# Patient Record
Sex: Female | Born: 1935 | Race: White | Hispanic: No | Marital: Married | Smoking: Former smoker
Health system: Southern US, Community
[De-identification: ages and names within clinical notes are randomized; demographics above are authoritative.]

## PROBLEM LIST (undated history)

## (undated) DIAGNOSIS — I10 Essential (primary) hypertension: Secondary | ICD-10-CM

## (undated) DIAGNOSIS — I2510 Atherosclerotic heart disease of native coronary artery without angina pectoris: Secondary | ICD-10-CM

## (undated) DIAGNOSIS — N390 Urinary tract infection, site not specified: Secondary | ICD-10-CM

## (undated) DIAGNOSIS — I639 Cerebral infarction, unspecified: Secondary | ICD-10-CM

## (undated) DIAGNOSIS — I219 Acute myocardial infarction, unspecified: Secondary | ICD-10-CM

## (undated) DIAGNOSIS — M810 Age-related osteoporosis without current pathological fracture: Secondary | ICD-10-CM

## (undated) HISTORY — DX: Age-related osteoporosis without current pathological fracture: M81.0

## (undated) HISTORY — PX: APPENDECTOMY: SHX54

## (undated) HISTORY — DX: Essential (primary) hypertension: I10

## (undated) HISTORY — DX: Cerebral infarction, unspecified: I63.9

## (undated) HISTORY — PX: COLONOSCOPY: SHX174

## (undated) HISTORY — PX: CATARACT EXTRACTION: SUR2

## (undated) HISTORY — DX: Urinary tract infection, site not specified: N39.0

---

## 1984-08-03 DIAGNOSIS — I219 Acute myocardial infarction, unspecified: Secondary | ICD-10-CM

## 1984-08-03 HISTORY — DX: Acute myocardial infarction, unspecified: I21.9

## 1999-08-27 ENCOUNTER — Encounter: Payer: Self-pay | Admitting: Family Medicine

## 1999-08-27 ENCOUNTER — Encounter: Admission: RE | Admit: 1999-08-27 | Discharge: 1999-08-27 | Payer: Self-pay | Admitting: Family Medicine

## 1999-09-24 ENCOUNTER — Other Ambulatory Visit: Admission: RE | Admit: 1999-09-24 | Discharge: 1999-09-24 | Payer: Self-pay | Admitting: Family Medicine

## 2000-10-20 ENCOUNTER — Encounter: Payer: Self-pay | Admitting: Family Medicine

## 2000-10-20 ENCOUNTER — Encounter: Admission: RE | Admit: 2000-10-20 | Discharge: 2000-10-20 | Payer: Self-pay | Admitting: Family Medicine

## 2000-11-01 ENCOUNTER — Encounter: Payer: Self-pay | Admitting: Family Medicine

## 2000-11-01 ENCOUNTER — Encounter: Admission: RE | Admit: 2000-11-01 | Discharge: 2000-11-01 | Payer: Self-pay | Admitting: Family Medicine

## 2001-12-01 ENCOUNTER — Encounter: Admission: RE | Admit: 2001-12-01 | Discharge: 2001-12-01 | Payer: Self-pay | Admitting: Family Medicine

## 2001-12-01 ENCOUNTER — Encounter: Payer: Self-pay | Admitting: Family Medicine

## 2001-12-12 ENCOUNTER — Encounter (INDEPENDENT_AMBULATORY_CARE_PROVIDER_SITE_OTHER): Payer: Self-pay

## 2001-12-12 ENCOUNTER — Ambulatory Visit (HOSPITAL_COMMUNITY): Admission: RE | Admit: 2001-12-12 | Discharge: 2001-12-12 | Payer: Self-pay | Admitting: Gastroenterology

## 2002-12-22 ENCOUNTER — Encounter: Payer: Self-pay | Admitting: Family Medicine

## 2002-12-22 ENCOUNTER — Encounter: Admission: RE | Admit: 2002-12-22 | Discharge: 2002-12-22 | Payer: Self-pay | Admitting: Family Medicine

## 2004-01-10 ENCOUNTER — Encounter: Admission: RE | Admit: 2004-01-10 | Discharge: 2004-01-10 | Payer: Self-pay | Admitting: Family Medicine

## 2005-02-05 ENCOUNTER — Encounter: Admission: RE | Admit: 2005-02-05 | Discharge: 2005-02-05 | Payer: Self-pay | Admitting: Family Medicine

## 2006-02-25 ENCOUNTER — Encounter: Admission: RE | Admit: 2006-02-25 | Discharge: 2006-02-25 | Payer: Self-pay | Admitting: Family Medicine

## 2006-08-26 ENCOUNTER — Other Ambulatory Visit: Admission: RE | Admit: 2006-08-26 | Discharge: 2006-08-26 | Payer: Self-pay | Admitting: Family Medicine

## 2007-01-29 ENCOUNTER — Emergency Department (HOSPITAL_COMMUNITY): Admission: EM | Admit: 2007-01-29 | Discharge: 2007-01-29 | Payer: Self-pay | Admitting: Emergency Medicine

## 2007-03-23 ENCOUNTER — Encounter: Admission: RE | Admit: 2007-03-23 | Discharge: 2007-03-23 | Payer: Self-pay | Admitting: Family Medicine

## 2008-03-29 ENCOUNTER — Encounter: Admission: RE | Admit: 2008-03-29 | Discharge: 2008-03-29 | Payer: Self-pay | Admitting: Family Medicine

## 2009-04-12 ENCOUNTER — Encounter: Admission: RE | Admit: 2009-04-12 | Discharge: 2009-04-12 | Payer: Self-pay | Admitting: Family Medicine

## 2009-12-11 ENCOUNTER — Emergency Department (HOSPITAL_COMMUNITY): Admission: EM | Admit: 2009-12-11 | Discharge: 2009-12-11 | Payer: Self-pay | Admitting: Emergency Medicine

## 2010-04-14 ENCOUNTER — Encounter: Admission: RE | Admit: 2010-04-14 | Discharge: 2010-04-14 | Payer: Self-pay | Admitting: Family Medicine

## 2010-08-18 ENCOUNTER — Encounter: Payer: Self-pay | Admitting: Cardiology

## 2010-08-24 ENCOUNTER — Encounter: Payer: Self-pay | Admitting: Family Medicine

## 2010-08-25 ENCOUNTER — Encounter: Payer: Self-pay | Admitting: Cardiology

## 2010-08-25 ENCOUNTER — Ambulatory Visit
Admission: RE | Admit: 2010-08-25 | Discharge: 2010-08-25 | Payer: Self-pay | Source: Home / Self Care | Attending: Cardiology | Admitting: Cardiology

## 2010-08-25 DIAGNOSIS — E7800 Pure hypercholesterolemia, unspecified: Secondary | ICD-10-CM | POA: Insufficient documentation

## 2010-08-25 DIAGNOSIS — I2510 Atherosclerotic heart disease of native coronary artery without angina pectoris: Secondary | ICD-10-CM | POA: Insufficient documentation

## 2010-08-25 DIAGNOSIS — R072 Precordial pain: Secondary | ICD-10-CM | POA: Insufficient documentation

## 2010-09-02 ENCOUNTER — Telehealth (INDEPENDENT_AMBULATORY_CARE_PROVIDER_SITE_OTHER): Payer: Self-pay | Admitting: *Deleted

## 2010-09-03 ENCOUNTER — Encounter: Payer: Self-pay | Admitting: Cardiovascular Disease

## 2010-09-03 ENCOUNTER — Ambulatory Visit (HOSPITAL_COMMUNITY): Payer: Medicare Other | Attending: Cardiovascular Disease

## 2010-09-03 DIAGNOSIS — R0789 Other chest pain: Secondary | ICD-10-CM

## 2010-09-03 DIAGNOSIS — I2510 Atherosclerotic heart disease of native coronary artery without angina pectoris: Secondary | ICD-10-CM

## 2010-09-03 DIAGNOSIS — R0609 Other forms of dyspnea: Secondary | ICD-10-CM

## 2010-09-04 ENCOUNTER — Encounter: Payer: Self-pay | Admitting: Cardiovascular Disease

## 2010-09-04 NOTE — Assessment & Plan Note (Addendum)
Summary: NP6/CHEST PAIN   Visit Type:  Initial Consult Primary Provider:  Andria Meuse. Shaw  CC:  chest pain.  History of Present Illness: [name] is referred in for evaluation of her chest pain.  She is the mother of Loraine Leriche and Artis Buechele, both patients of mine.  We performed an angioplasty on her in 1986, although those records are not currently available.  She had an episode of chest pain recently, and occured in one spot.  She has had none since. She has had no recent pain. Office note of Dr. Clelia Croft was reviewed.  Patient had pain for seventy-five days. It did feel like her prior Mi.  It occured under her arm, and improved with pressing on it.  It was non exertion.  Position change was worse, and it kept her up at night. She got a massage at work which helped it.  She was given appropriate advice for worsening symptoms, and they thought this was MS pain.  EKG was interpreted as LAE, otherwise normal.  Perfectly fine since that time.   Problems Prior to Update: 1)  Coronary Atherosclerosis Native Coronary Artery  (ICD-414.01) 2)  Chest Pain, Precordial  (ICD-786.51)  Current Medications (verified): 1)  Lipitor 40 Mg Tabs (Atorvastatin Calcium) .... Take One Tablet By Mouth Daily. 2)  Evista 60 Mg Tabs (Raloxifene Hcl) .... Take 1 Tablet By Mouth Once A Day 3)  Metoprolol Tartrate 50 Mg Tabs (Metoprolol Tartrate) .... Take 1/2  Tablet By Mouth Daily 4)  Calcium 600+d Plus Minerals 600-400 Mg-Unit Tabs (Calcium Carbonate-Vit D-Min) .... Take 1 Tablet By Mouth Two Times A Day 5)  Multivitamins  Tabs (Multiple Vitamin) .... Take 1 Tablet By Mouth Once A Day 6)  Fish Oil 1000 Mg Caps (Omega-3 Fatty Acids) .... Take 1 Capsule By Mouth Once A Day  Allergies (verified): 1)  ! Codeine 2)  ! Aspirin  Past History:  Past Medical History: MI 72  PTCA  Past Surgical History: appendectomy arm fracture  Family History: Mother deceased 61 kidney failure Father deceased 31 MI one sister 51 with heart  valve issue Brothers 8 and 13 with MI  Social History: Quit smoking in 1966 She does not exercise much currently widowed.   Review of Systems  The patient denies anorexia, fever, weight loss, weight gain, vision loss, decreased hearing, hoarseness, chest pain, syncope, dyspnea on exertion, peripheral edema, prolonged cough, hemoptysis, abdominal pain, melena, hematochezia, and severe indigestion/heartburn.         Has allergies and takes no meds.   Vital Signs:  Patient profile:   77 year old female Height:      63 inches Weight:      154.25 pounds BMI:     27.42 Pulse rate:   68 / minute Pulse rhythm:   regular Resp:     18 per minute BP sitting:   150 / 90  (left arm) Cuff size:   regular  Vitals Entered By: Vikki Ports (August 25, 2010 4:38 PM)  Physical Exam  General:  Well developed, well nourished, in no acute distress. Head:  normocephalic and atraumatic Eyes:  PERRLA/EOM intact; conjunctiva and lids normal. Lungs:  Clear bilaterally to auscultation and percussion. Heart:  PMI non displaced.  Normal S1 and S2.  No murmur rub or gallop Abdomen:  Bowel sounds positive; abdomen soft and non-tender without masses, organomegaly, or hernias noted. No hepatosplenomegaly. Pulses:  pulses normal in all 4 extremities Extremities:  No clubbing or cyanosis. Neurologic:  Alert and oriented x 3.   EKG  Procedure date:  08/25/2010  Findings:      NSR.  WNL.  Impression & Recommendations:  Problem # 1:  CHEST PAIN, PRECORDIAL (ICD-786.51) Pain lasted two days and is gone. It was atypical, and yet similar to prior MI, which was 25 years ago.  Has history of PCI.  Will check at present with myoview given prior known CAD, and long duration. Her updated medication list for this problem includes:    Metoprolol Tartrate 50 Mg Tabs (Metoprolol tartrate) .Marland Kitchen... Take 1/2  tablet by mouth daily  Orders: EKG w/ Interpretation (93000) Nuclear Stress Test (Nuc Stress  Test)  Problem # 2:  CHEST PAIN, PRECORDIAL (ICD-786.51) See above.  Her updated medication list for this problem includes:    Metoprolol Tartrate 50 Mg Tabs (Metoprolol tartrate) .Marland Kitchen... Take 1/2  tablet by mouth daily  Orders: EKG w/ Interpretation (93000) Nuclear Stress Test (Nuc Stress Test)  Problem # 3:  HYPERCHOLESTEROLEMIA  IIA (ICD-272.0) on treatment with agressive prevention therapy. Her updated medication list for this problem includes:    Lipitor 40 Mg Tabs (Atorvastatin calcium) .Marland Kitchen... Take one tablet by mouth daily.   Patient Instructions: 1)  Your physician recommends that you schedule a follow-up appointment in: 1 MONTH 2)  Your physician has requested that you have a Lexiscan myoview.  For further information please visit [email or web address]  Please follow instruction sheet, as given. 3)  Your physician recommends that you continue on your current medications as directed. Please refer to the Current Medication list given to you today.  Appended Document: NP6/CHEST PAIN Additional diagnosis:  HTN on medical therapy.  Borderline BP today. TS  Appended Document: NP6/CHEST PAIN OV note and Myoview faxed to [phone] (office [phone]).

## 2010-09-10 NOTE — Assessment & Plan Note (Addendum)
Summary: Cardiology Nuclear Testing  Nuclear Med Background Indications for Stress Test: Evaluation for Ischemia, PTCA Patency   History: Angioplasty, Heart Catheterization, Myocardial Infarction  History Comments: '86 MI>Cath> PTCA.  Symptoms: Chest Pain, Chest Pressure, Palpitations  Symptoms Comments: Last CP couple weeks ago.   Nuclear Pre-Procedure Cardiac Risk Factors: Family History - CAD, History of Smoking, Lipids Caffeine/Decaff Intake: None NPO After: 7:00 PM Lungs: Clear IV 0.9% NS with Angio Cath: 22g     IV Site: R Antecubital IV Started by: Bonnita Levan, RN Chest Size (in) 36     Cup Size B     Height (in): 63 Weight (lb): 153 BMI: 27.20  Nuclear Med Study 1 or 2 day study:  1 day     Stress Test Type:  Treadmill/Lexiscan Reading MD:  Charlton Haws, MD     Referring MD:  Shawnie Pons Resting Radionuclide:  Technetium 36m Tetrofosmin     Resting Radionuclide Dose:  11.0 mCi  Stress Radionuclide:  Technetium 64m Tetrofosmin     Stress Radionuclide Dose:  33.0 mCi   Stress Protocol      Max HR:  114 bpm     Predicted Max HR:  142 bpm  Max Systolic BP: 135 mm Hg     Percent Max HR:  80.28 %Rate Pressure Product:  16109  Lexiscan: 0.4 mg   Stress Test Technologist:  Irean Hong,  RN     Nuclear Technologist:  Doyne Keel, CNMT  Rest Procedure  Myocardial perfusion imaging was performed at rest 45 minutes following the intravenous administration of Technetium 56m Tetrofosmin.  Stress Procedure  The patient received IV Lexiscan 0.4 mg over 15-seconds with concurrent low level exercise and then Technetium 69m Tetrofosmin was injected at 30-seconds while the patient continued walking one more minute.  There were no significant changes with Lexiscan.  Quantitative spect images were obtained after a 45 minute delay.  QPS Raw Data Images:  Normal; no motion artifact; normal heart/lung ratio. Stress Images:  Normal homogeneous uptake in all areas of the  myocardium. Rest Images:  Normal homogeneous uptake in all areas of the myocardium. Subtraction (SDS):  SDS 1 Transient Ischemic Dilatation:  0.85  (Normal <1.22)  Lung/Heart Ratio:  0.34  (Normal <0.45)  Quantitative Gated Spect Images QGS EDV:  56 ml QGS ESV:  16 ml QGS EF:  72 % QGS cine images:  normal  Findings Low risk nuclear study  Evidence for anterior (septal apical) infarct     Overall Impression  Exercise Capacity: Lexiscan with low level exercise BP Response: Normal blood pressure response. Clinical Symptoms: No chest pain ECG Impression: No significant ST segment change suggestive of ischemia. Overall Impression: Small apical lateral infarct with no ischemia  Appended Document: Cardiology Nuclear Testing please let patient know results.  Thx.  Also pull old chart for review.  This will be in archives.  Thx.  TS  Appended Document: Cardiology Nuclear Testing pt aware of results

## 2010-09-10 NOTE — Progress Notes (Signed)
Summary: nuc pre procedure  Phone Note Outgoing Call Call back at Home Phone [phone]   Call placed by: Cathlyn Parsons RN,  September 02, 2010 9:55 AM Call placed to: Patient Reason for Call: Confirm/change Appt Summary of Call: Left message with information on Myoview Information Sheet (see scanned document for details).      Nuclear Med Background Indications for Stress Test: Evaluation for Ischemia, PTCA Patency   History: Angioplasty  History Comments: 88 PTCA   Symptoms: Chest Pain    Nuclear Pre-Procedure Cardiac Risk Factors: Family History - CAD, History of Smoking, Lipids Height (in): 63

## 2010-09-23 ENCOUNTER — Ambulatory Visit: Payer: Self-pay | Admitting: Cardiology

## 2010-10-21 NOTE — Letter (Signed)
Summary: Tracey Hartman - Office Note  [facility] - Office Note   Imported By: [name] 10/09/2010 16:07:37  _____________________________________________________________________  External Attachment:    Type:   Image     Comment:   External Document

## 2011-04-09 ENCOUNTER — Other Ambulatory Visit: Payer: Self-pay | Admitting: Family Medicine

## 2011-04-09 DIAGNOSIS — Z1231 Encounter for screening mammogram for malignant neoplasm of breast: Secondary | ICD-10-CM

## 2011-04-13 ENCOUNTER — Ambulatory Visit
Admission: RE | Admit: 2011-04-13 | Discharge: 2011-04-13 | Disposition: A | Discharge status: Home / Self Care | Payer: Medicare Other | Source: Ambulatory Visit | Attending: Family Medicine | Admitting: Family Medicine

## 2011-04-13 DIAGNOSIS — Z1231 Encounter for screening mammogram for malignant neoplasm of breast: Secondary | ICD-10-CM

## 2011-07-21 ENCOUNTER — Ambulatory Visit (HOSPITAL_COMMUNITY)
Admission: RE | Admit: 2011-07-21 | Discharge: 2011-07-21 | Disposition: A | Discharge status: Home / Self Care | Payer: Medicare Other | Source: Ambulatory Visit | Attending: Chiropractic Medicine | Admitting: Chiropractic Medicine

## 2011-07-21 ENCOUNTER — Other Ambulatory Visit (HOSPITAL_COMMUNITY): Payer: Self-pay | Admitting: Chiropractic Medicine

## 2011-07-21 DIAGNOSIS — IMO0002 Reserved for concepts with insufficient information to code with codable children: Secondary | ICD-10-CM | POA: Insufficient documentation

## 2011-07-21 DIAGNOSIS — R52 Pain, unspecified: Secondary | ICD-10-CM

## 2011-07-21 DIAGNOSIS — M75100 Unspecified rotator cuff tear or rupture of unspecified shoulder, not specified as traumatic: Secondary | ICD-10-CM | POA: Insufficient documentation

## 2011-07-21 DIAGNOSIS — M25519 Pain in unspecified shoulder: Secondary | ICD-10-CM | POA: Insufficient documentation

## 2011-09-21 DIAGNOSIS — N3020 Other chronic cystitis without hematuria: Secondary | ICD-10-CM | POA: Diagnosis not present

## 2011-09-21 DIAGNOSIS — R3129 Other microscopic hematuria: Secondary | ICD-10-CM | POA: Diagnosis not present

## 2011-11-06 DIAGNOSIS — N390 Urinary tract infection, site not specified: Secondary | ICD-10-CM | POA: Diagnosis not present

## 2011-11-06 DIAGNOSIS — E782 Mixed hyperlipidemia: Secondary | ICD-10-CM | POA: Diagnosis not present

## 2011-11-06 DIAGNOSIS — I2510 Atherosclerotic heart disease of native coronary artery without angina pectoris: Secondary | ICD-10-CM | POA: Diagnosis not present

## 2011-11-06 DIAGNOSIS — I10 Essential (primary) hypertension: Secondary | ICD-10-CM | POA: Diagnosis not present

## 2011-11-23 DIAGNOSIS — D485 Neoplasm of uncertain behavior of skin: Secondary | ICD-10-CM | POA: Diagnosis not present

## 2011-11-23 DIAGNOSIS — L570 Actinic keratosis: Secondary | ICD-10-CM | POA: Diagnosis not present

## 2012-03-31 ENCOUNTER — Other Ambulatory Visit: Payer: Self-pay | Admitting: Family Medicine

## 2012-03-31 DIAGNOSIS — Z1231 Encounter for screening mammogram for malignant neoplasm of breast: Secondary | ICD-10-CM

## 2012-04-08 DIAGNOSIS — H2510 Age-related nuclear cataract, unspecified eye: Secondary | ICD-10-CM | POA: Diagnosis not present

## 2012-04-08 DIAGNOSIS — H25019 Cortical age-related cataract, unspecified eye: Secondary | ICD-10-CM | POA: Diagnosis not present

## 2012-04-08 DIAGNOSIS — H43819 Vitreous degeneration, unspecified eye: Secondary | ICD-10-CM | POA: Diagnosis not present

## 2012-04-08 DIAGNOSIS — H5210 Myopia, unspecified eye: Secondary | ICD-10-CM | POA: Diagnosis not present

## 2012-04-18 ENCOUNTER — Ambulatory Visit
Admission: RE | Admit: 2012-04-18 | Discharge: 2012-04-18 | Disposition: A | Discharge status: Home / Self Care | Payer: Medicare Other | Source: Ambulatory Visit | Attending: Family Medicine | Admitting: Family Medicine

## 2012-04-18 DIAGNOSIS — Z1231 Encounter for screening mammogram for malignant neoplasm of breast: Secondary | ICD-10-CM | POA: Diagnosis not present

## 2012-06-23 ENCOUNTER — Other Ambulatory Visit: Payer: Self-pay | Admitting: Surgery

## 2012-06-23 DIAGNOSIS — R7301 Impaired fasting glucose: Secondary | ICD-10-CM | POA: Diagnosis not present

## 2012-06-23 DIAGNOSIS — Z1331 Encounter for screening for depression: Secondary | ICD-10-CM | POA: Diagnosis not present

## 2012-06-23 DIAGNOSIS — Z0000 Encounter for general adult medical examination without abnormal findings: Secondary | ICD-10-CM | POA: Diagnosis not present

## 2012-06-23 DIAGNOSIS — M810 Age-related osteoporosis without current pathological fracture: Secondary | ICD-10-CM | POA: Diagnosis not present

## 2012-06-23 DIAGNOSIS — I10 Essential (primary) hypertension: Secondary | ICD-10-CM | POA: Diagnosis not present

## 2012-06-23 DIAGNOSIS — D485 Neoplasm of uncertain behavior of skin: Secondary | ICD-10-CM | POA: Diagnosis not present

## 2012-06-23 DIAGNOSIS — E782 Mixed hyperlipidemia: Secondary | ICD-10-CM | POA: Diagnosis not present

## 2012-06-23 DIAGNOSIS — L570 Actinic keratosis: Secondary | ICD-10-CM | POA: Diagnosis not present

## 2012-06-23 DIAGNOSIS — L851 Acquired keratosis [keratoderma] palmaris et plantaris: Secondary | ICD-10-CM | POA: Diagnosis not present

## 2012-06-23 DIAGNOSIS — I2510 Atherosclerotic heart disease of native coronary artery without angina pectoris: Secondary | ICD-10-CM | POA: Diagnosis not present

## 2012-07-06 DIAGNOSIS — R7301 Impaired fasting glucose: Secondary | ICD-10-CM | POA: Diagnosis not present

## 2012-07-20 DIAGNOSIS — E119 Type 2 diabetes mellitus without complications: Secondary | ICD-10-CM | POA: Diagnosis not present

## 2012-08-11 DIAGNOSIS — Z9181 History of falling: Secondary | ICD-10-CM | POA: Diagnosis not present

## 2012-08-11 DIAGNOSIS — E119 Type 2 diabetes mellitus without complications: Secondary | ICD-10-CM | POA: Diagnosis not present

## 2012-08-11 DIAGNOSIS — I10 Essential (primary) hypertension: Secondary | ICD-10-CM | POA: Diagnosis not present

## 2012-08-11 DIAGNOSIS — E782 Mixed hyperlipidemia: Secondary | ICD-10-CM | POA: Diagnosis not present

## 2012-08-17 DIAGNOSIS — E119 Type 2 diabetes mellitus without complications: Secondary | ICD-10-CM | POA: Diagnosis not present

## 2012-09-27 DIAGNOSIS — D485 Neoplasm of uncertain behavior of skin: Secondary | ICD-10-CM | POA: Diagnosis not present

## 2012-09-27 DIAGNOSIS — D235 Other benign neoplasm of skin of trunk: Secondary | ICD-10-CM | POA: Diagnosis not present

## 2012-12-21 DIAGNOSIS — Z23 Encounter for immunization: Secondary | ICD-10-CM | POA: Diagnosis not present

## 2012-12-21 DIAGNOSIS — I119 Hypertensive heart disease without heart failure: Secondary | ICD-10-CM | POA: Diagnosis not present

## 2012-12-21 DIAGNOSIS — E782 Mixed hyperlipidemia: Secondary | ICD-10-CM | POA: Diagnosis not present

## 2012-12-21 DIAGNOSIS — E119 Type 2 diabetes mellitus without complications: Secondary | ICD-10-CM | POA: Diagnosis not present

## 2012-12-21 DIAGNOSIS — N390 Urinary tract infection, site not specified: Secondary | ICD-10-CM | POA: Diagnosis not present

## 2012-12-21 DIAGNOSIS — I2510 Atherosclerotic heart disease of native coronary artery without angina pectoris: Secondary | ICD-10-CM | POA: Diagnosis not present

## 2013-02-27 DIAGNOSIS — M6281 Muscle weakness (generalized): Secondary | ICD-10-CM | POA: Diagnosis not present

## 2013-03-16 ENCOUNTER — Other Ambulatory Visit: Payer: Self-pay

## 2013-03-16 DIAGNOSIS — Z1231 Encounter for screening mammogram for malignant neoplasm of breast: Secondary | ICD-10-CM

## 2013-03-21 DIAGNOSIS — D485 Neoplasm of uncertain behavior of skin: Secondary | ICD-10-CM | POA: Diagnosis not present

## 2013-04-14 DIAGNOSIS — H25019 Cortical age-related cataract, unspecified eye: Secondary | ICD-10-CM | POA: Diagnosis not present

## 2013-04-14 DIAGNOSIS — H2510 Age-related nuclear cataract, unspecified eye: Secondary | ICD-10-CM | POA: Diagnosis not present

## 2013-04-14 DIAGNOSIS — H43819 Vitreous degeneration, unspecified eye: Secondary | ICD-10-CM | POA: Diagnosis not present

## 2013-04-24 ENCOUNTER — Ambulatory Visit: Payer: Medicare Other

## 2013-04-25 ENCOUNTER — Ambulatory Visit
Admission: RE | Admit: 2013-04-25 | Discharge: 2013-04-25 | Disposition: A | Discharge status: Home / Self Care | Payer: PRIVATE HEALTH INSURANCE | Source: Ambulatory Visit

## 2013-04-25 DIAGNOSIS — Z1231 Encounter for screening mammogram for malignant neoplasm of breast: Secondary | ICD-10-CM | POA: Diagnosis not present

## 2013-06-07 DIAGNOSIS — H2510 Age-related nuclear cataract, unspecified eye: Secondary | ICD-10-CM | POA: Diagnosis not present

## 2013-06-07 DIAGNOSIS — H25019 Cortical age-related cataract, unspecified eye: Secondary | ICD-10-CM | POA: Diagnosis not present

## 2013-06-07 DIAGNOSIS — H2589 Other age-related cataract: Secondary | ICD-10-CM | POA: Diagnosis not present

## 2013-07-12 DIAGNOSIS — I2510 Atherosclerotic heart disease of native coronary artery without angina pectoris: Secondary | ICD-10-CM | POA: Diagnosis not present

## 2013-07-12 DIAGNOSIS — I119 Hypertensive heart disease without heart failure: Secondary | ICD-10-CM | POA: Diagnosis not present

## 2013-07-12 DIAGNOSIS — Z0000 Encounter for general adult medical examination without abnormal findings: Secondary | ICD-10-CM | POA: Diagnosis not present

## 2013-07-12 DIAGNOSIS — E782 Mixed hyperlipidemia: Secondary | ICD-10-CM | POA: Diagnosis not present

## 2013-07-12 DIAGNOSIS — N390 Urinary tract infection, site not specified: Secondary | ICD-10-CM | POA: Diagnosis not present

## 2013-07-12 DIAGNOSIS — E119 Type 2 diabetes mellitus without complications: Secondary | ICD-10-CM | POA: Diagnosis not present

## 2013-07-12 DIAGNOSIS — M810 Age-related osteoporosis without current pathological fracture: Secondary | ICD-10-CM | POA: Diagnosis not present

## 2013-07-12 DIAGNOSIS — Z1331 Encounter for screening for depression: Secondary | ICD-10-CM | POA: Diagnosis not present

## 2013-09-13 DIAGNOSIS — H2510 Age-related nuclear cataract, unspecified eye: Secondary | ICD-10-CM | POA: Diagnosis not present

## 2013-09-13 DIAGNOSIS — H25019 Cortical age-related cataract, unspecified eye: Secondary | ICD-10-CM | POA: Diagnosis not present

## 2013-09-13 DIAGNOSIS — H2589 Other age-related cataract: Secondary | ICD-10-CM | POA: Diagnosis not present

## 2013-10-17 DIAGNOSIS — M810 Age-related osteoporosis without current pathological fracture: Secondary | ICD-10-CM | POA: Diagnosis not present

## 2013-11-02 ENCOUNTER — Other Ambulatory Visit: Payer: Self-pay | Admitting: Physician Assistant

## 2013-11-02 ENCOUNTER — Ambulatory Visit
Admission: RE | Admit: 2013-11-02 | Discharge: 2013-11-02 | Disposition: A | Discharge status: Home / Self Care | Payer: PRIVATE HEALTH INSURANCE | Source: Ambulatory Visit | Attending: Physician Assistant | Admitting: Physician Assistant

## 2013-11-02 DIAGNOSIS — M6281 Muscle weakness (generalized): Secondary | ICD-10-CM

## 2013-11-02 DIAGNOSIS — R42 Dizziness and giddiness: Secondary | ICD-10-CM | POA: Diagnosis not present

## 2013-11-02 DIAGNOSIS — R29818 Other symptoms and signs involving the nervous system: Secondary | ICD-10-CM | POA: Diagnosis not present

## 2013-12-12 ENCOUNTER — Other Ambulatory Visit: Payer: Self-pay | Admitting: Family Medicine

## 2014-01-08 ENCOUNTER — Other Ambulatory Visit: Payer: Self-pay | Admitting: Family Medicine

## 2014-01-08 DIAGNOSIS — R279 Unspecified lack of coordination: Secondary | ICD-10-CM | POA: Diagnosis not present

## 2014-01-08 DIAGNOSIS — E782 Mixed hyperlipidemia: Secondary | ICD-10-CM | POA: Diagnosis not present

## 2014-01-08 DIAGNOSIS — R270 Ataxia, unspecified: Secondary | ICD-10-CM

## 2014-01-08 DIAGNOSIS — E119 Type 2 diabetes mellitus without complications: Secondary | ICD-10-CM | POA: Diagnosis not present

## 2014-01-08 DIAGNOSIS — M6281 Muscle weakness (generalized): Secondary | ICD-10-CM | POA: Diagnosis not present

## 2014-01-08 DIAGNOSIS — Z23 Encounter for immunization: Secondary | ICD-10-CM | POA: Diagnosis not present

## 2014-01-08 DIAGNOSIS — I2510 Atherosclerotic heart disease of native coronary artery without angina pectoris: Secondary | ICD-10-CM | POA: Diagnosis not present

## 2014-01-08 DIAGNOSIS — I119 Hypertensive heart disease without heart failure: Secondary | ICD-10-CM | POA: Diagnosis not present

## 2014-01-08 DIAGNOSIS — M542 Cervicalgia: Secondary | ICD-10-CM

## 2014-01-14 ENCOUNTER — Ambulatory Visit
Admission: RE | Admit: 2014-01-14 | Discharge: 2014-01-14 | Disposition: A | Discharge status: Home / Self Care | Payer: PRIVATE HEALTH INSURANCE | Source: Ambulatory Visit | Attending: Family Medicine | Admitting: Family Medicine

## 2014-01-14 ENCOUNTER — Ambulatory Visit
Admission: RE | Admit: 2014-01-14 | Discharge: 2014-01-14 | Disposition: A | Discharge status: Home / Self Care | Payer: Medicare Other | Source: Ambulatory Visit | Attending: Family Medicine | Admitting: Family Medicine

## 2014-01-14 DIAGNOSIS — M5030 Other cervical disc degeneration, unspecified cervical region: Secondary | ICD-10-CM | POA: Diagnosis not present

## 2014-01-14 DIAGNOSIS — R270 Ataxia, unspecified: Secondary | ICD-10-CM

## 2014-01-14 DIAGNOSIS — M542 Cervicalgia: Secondary | ICD-10-CM

## 2014-01-14 DIAGNOSIS — R279 Unspecified lack of coordination: Secondary | ICD-10-CM | POA: Diagnosis not present

## 2014-01-14 DIAGNOSIS — M47812 Spondylosis without myelopathy or radiculopathy, cervical region: Secondary | ICD-10-CM | POA: Diagnosis not present

## 2014-01-14 DIAGNOSIS — M6281 Muscle weakness (generalized): Secondary | ICD-10-CM

## 2014-01-14 DIAGNOSIS — M4802 Spinal stenosis, cervical region: Secondary | ICD-10-CM | POA: Diagnosis not present

## 2014-01-14 MED ORDER — GADOBENATE DIMEGLUMINE 529 MG/ML IV SOLN
14.0000 mL | Freq: Once | INTRAVENOUS | Status: AC | PRN
  Administered 2014-01-14: 14 mL via INTRAVENOUS

## 2014-02-19 ENCOUNTER — Ambulatory Visit: Payer: Medicare Other | Admitting: Neurology

## 2014-02-20 ENCOUNTER — Encounter: Payer: Self-pay | Admitting: Neurology

## 2014-02-20 ENCOUNTER — Ambulatory Visit (INDEPENDENT_AMBULATORY_CARE_PROVIDER_SITE_OTHER): Payer: Medicare Other | Admitting: Neurology

## 2014-02-20 VITALS — BP 134/76 | HR 61 | Ht 62.5 in | Wt 154.0 lb

## 2014-02-20 DIAGNOSIS — I6350 Cerebral infarction due to unspecified occlusion or stenosis of unspecified cerebral artery: Secondary | ICD-10-CM

## 2014-02-20 DIAGNOSIS — R269 Unspecified abnormalities of gait and mobility: Secondary | ICD-10-CM | POA: Diagnosis not present

## 2014-02-20 DIAGNOSIS — I6381 Other cerebral infarction due to occlusion or stenosis of small artery: Secondary | ICD-10-CM | POA: Insufficient documentation

## 2014-02-20 DIAGNOSIS — I679 Cerebrovascular disease, unspecified: Secondary | ICD-10-CM | POA: Insufficient documentation

## 2014-02-20 NOTE — Patient Instructions (Signed)
I had a long discussion with the patient and her granddaughter regarding her right hand weakness and gait difficulties and believes she had a small lacunar infarct which was not seen on MRI as it was done a few months after her symptoms. I personally reviewed imaging studies and pertinent data with the patient She does have evidence of significant small vessel disease and risk factors for stroke. I recommend she stay on aspirin 81 mg daily for stroke prevention and strict control of hypertension with blood pressure goal below 130/90, lipids with LDL cholesterol goal below 100 mg percent and diabetes with hemoglobin A1c equal to 6.5%. I encouraged her to maintain adequate hydration. Check carotid contrast and Doppler studies, 2-D echo and ESR. Return for followup in 3 months with Charlott Holler, NP. or call earlier if necessary.The duration of this appointment visit was 50 minutes of face-to-face time with the patient. Greater than 50% of this time was spent in counseling, explanation of diagnosis, planning of further management, and coordination of care.  Stroke Prevention Some medical conditions and behaviors are associated with an increased chance of having a stroke. You may prevent a stroke by making healthy choices and managing medical conditions. HOW CAN I REDUCE MY RISK OF HAVING A STROKE?   Stay physically active. Get at least 30 minutes of activity on most or all days.  Do not smoke. It may also be helpful to avoid exposure to secondhand smoke.  Limit alcohol use. Moderate alcohol use is considered to be:  No more than 2 drinks per day for men.  No more than 1 drink per day for nonpregnant women.  Eat healthy foods. This involves  Eating 5 or more servings of fruits and vegetables a day.  Following a diet that addresses high blood pressure (hypertension), high cholesterol, diabetes, or obesity.  Manage your cholesterol levels.  A diet low in saturated fat, trans fat, and cholesterol and high  in fiber may control cholesterol levels.  Take any prescribed medicines to control cholesterol as directed by your health care provider.  Manage your diabetes.  A controlled-carbohydrate, controlled-sugar diet is recommended to manage diabetes.  Take any prescribed medicines to control diabetes as directed by your health care provider.  Control your hypertension.  A low-salt (sodium), low-saturated fat, low-trans fat, and low-cholesterol diet is recommended to manage hypertension.  Take any prescribed medicines to control hypertension as directed by your health care provider.  Maintain a healthy weight.  A reduced-calorie, low-sodium, low-saturated fat, low-trans fat, low-cholesterol diet is recommended to manage weight.  Stop drug abuse.  Avoid taking birth control pills.  Talk to your health care provider about the risks of taking birth control pills if you are over 34 years old, smoke, get migraines, or have ever had a blood clot.  Get evaluated for sleep disorders (sleep apnea).  Talk to your health care provider about getting a sleep evaluation if you snore a lot or have excessive sleepiness.  Take medicines as directed by your health care provider.  For some people, aspirin or blood thinners (anticoagulants) are helpful in reducing the risk of forming abnormal blood clots that can lead to stroke. If you have the irregular heart rhythm of atrial fibrillation, you should be on a blood thinner unless there is a good reason you cannot take them.  Understand all your medicine instructions.  Make sure that other other conditions (such as anemia or atherosclerosis) are addressed. SEEK IMMEDIATE MEDICAL CARE IF:   You have sudden  weakness or numbness of the face, arm, or leg, especially on one side of the body.  Your face or eyelid droops to one side.  You have sudden confusion.  You have trouble speaking (aphasia) or understanding.  You have sudden trouble seeing in one  or both eyes.  You have sudden trouble walking.  You have dizziness.  You have a loss of balance or coordination.  You have a sudden, severe headache with no known cause.  You have new chest pain or an irregular heartbeat. Any of these symptoms may represent a serious problem that is an emergency. Do not wait to see if the symptoms will go away. Get medical help at once. Call your local emergency services  (911 in [place] Do not drive yourself to the hospital. Document Released: 08/27/2004 Document Revised: 05/10/2013 Document Reviewed: 01/20/2013 [facility] Patient Information 2015 [place], [place]. This information is not intended to replace advice given to you by your health care provider. Make sure you discuss any questions you have with your health care provider.

## 2014-02-20 NOTE — Progress Notes (Signed)
[facility] [address] [place]. [place] [address] [phone]       OFFICE CONSULT NOTE  [name] Date of Birth:  04/25/1930 Medical Record Number:  [id number]   Referring MD:  Tracey Hartman Reason for Referral:  Right hand weakness HPI: 88 year Caucasian lady who developed sudden onset of right hand tingling as well as weakness and gait imbalance in  early April 2015.Marland Kitchen She could barely walk and use her hand to hold objects in right for several days. She subsequently noticed some improvement. She called her granddaughter who works in [employer] at [place] who advised her to see her primary physician. She had CT scan of the head done on 11/02/2013 3 days after the event which I personally reviewed shows no definite acute abnormality but changes of small vessel disease. She has noticed improvement over the last couple of months and feels she has significant return of strength and is able to write and drip though she's not completely back to normal. She can also walk a lot better. The tingling in the hand has resolved. She has been started on baby aspirin which is tolerating well and also on a statin and metoprolol tartrate pressure. Patient had MRI scan of the brain done on 01/14/2014 which I personally reviewed and shows moderate changes of small vessel disease and several tiny microhemorrhages on gradient echo images. Intracranial and extracranial vascular imaging has not been performed and echo has also not been done. Hemoglobin A1c was borderline at 6.3 and lipid profile showed LDL of 94 mg percent. She has been tolerating her medications without side effect. She complains of some mild pressure on her scalp in the right side but denies classical symptoms of vision loss, jaw claudication or scalp tenderness. She has no known prior history of strokes or TIAs.  ROS:   14 system review of systems is positive for hand weakness, gait ataxia, imbalance, decreased energy and  all the systems negative  PMH:  Past Medical History  Diagnosis Date  . IBS (irritable bowel syndrome)   . Osteoporosis   . Recurrent UTI   . Diabetes mellitus without complication   . Hypertension     Social History:  History   Social History  . Marital Status: Married    Spouse Name: N/A    Number of Children: 4  . Years of Education: 12th   Occupational History  . claims     Social History Main Topics  . Smoking status: Former Research scientist (life sciences)  . Smokeless tobacco: Not on file  . Alcohol Use: No     Comment: patient drinks coffee  . Drug Use: No  . Sexual Activity: No   Other Topics Concern  . Not on file   Social History Narrative   Patient lives at home alone    Patient is right handed   Patient drinks coffee daily    Medications:   No current outpatient prescriptions on file prior to visit.   No current facility-administered medications on file prior to visit.    Allergies:   Allergies  Allergen Reactions  . Aspirin   . Codeine     Physical Exam General: Pleasant elderly Caucasian lady seated, in no evident distress Head: head normocephalic and atraumatic. Orohparynx benign Neck: supple with no carotid or supraclavicular bruits Cardiovascular: regular rate and rhythm, no murmurs Musculoskeletal: Mild kyphoscoliosis Skin:  no rash/petichiae Vascular:  Normal pulses all extremities Filed Vitals:   02/20/14 0852  BP: 134/76  Pulse: 61    Neurologic Exam Mental Status: Awake and fully alert. Oriented to place and time. Recent and remote memory intact. Attention span, concentration and fund of knowledge appropriate. Mood and affect appropriate. Diminished recall 2/3. Animal naming 9. Clock drawing 4/4 Cranial Nerves: Fundoscopic exam reveals sharp disc margins. Pupils equal, briskly reactive to light. Extraocular movements full without nystagmus. Visual fields full to confrontation. Hearing intact. Facial sensation intact. Face, tongue, palate moves  normally and symmetrically.  Motor: Normal bulk and tone. Normal strength in all tested extremity muscles. Diminished fine finger movements on the right. Orbits left-to-right upper extremity. Mild weakness of right grip. Sensory.: intact to touch and pinprick and vibratory .  Coordination: Rapid alternating movements normal in all extremities. Finger-to-nose and heel-to-shin performed accurately bilaterally. Gait and Station: Arises from chair without difficulty. Stance is normal. Gait demonstrates normal stride length and balance . Not able to heel, toe and tandem walk without difficulty.  Reflexes: 1+ and symmetric. Toes downgoing.   NIHSS 0 Modified Rankin  1   ASSESSMENT: 52 year Caucasian lady with right hand weakness and gait abnormality in April 2015 which has mostly improved likely from a small left brain subcortical infarct from lacunar disease. MRI scan of the brain done 2 months after the clinical event shows no acute infarct but extensive small vessel disease changes. Vascular risk factors of borderline hypertension, hyperlipidemia and diabetes.    PLAN: I had a long discussion with the patient and her granddaughter regarding her right hand weakness and gait difficulties and believes she had a small lacunar infarct which was not seen on MRI as it was done a few months after her symptoms. I personally reviewed imaging studies and pertinent data with the patient She does have evidence of significant small vessel disease and risk factors for stroke. I recommend she stay on aspirin 81 mg daily for stroke prevention and strict control of hypertension with blood pressure goal below 130/90, lipids with LDL cholesterol goal below 100 mg percent and diabetes with hemoglobin A1c equal to 6.5%. I encouraged her to maintain adequate hydration. Check carotid contrast and Doppler studies, 2-D echo and ESR. Return for followup in 3 months with Charlott Holler, NP. or call earlier if necessary.The duration of  this appointment visit was 50 minutes of face-to-face time with the patient. Greater than 50% of this time was spent in counseling, explanation of diagnosis, planning of further management, and coordination of care.  Note: This document was prepared with digital dictation and possible smart phrase technology. Any transcriptional errors that result from this process are unintentional.

## 2014-02-21 LAB — SEDIMENTATION RATE: SED RATE: 2 mm/h (ref 0–40)

## 2014-02-23 ENCOUNTER — Ambulatory Visit (HOSPITAL_COMMUNITY): Payer: Medicare Other | Attending: Neurology | Admitting: Radiology

## 2014-02-23 DIAGNOSIS — I10 Essential (primary) hypertension: Secondary | ICD-10-CM | POA: Insufficient documentation

## 2014-02-23 DIAGNOSIS — E119 Type 2 diabetes mellitus without complications: Secondary | ICD-10-CM | POA: Diagnosis not present

## 2014-02-23 DIAGNOSIS — I079 Rheumatic tricuspid valve disease, unspecified: Secondary | ICD-10-CM | POA: Insufficient documentation

## 2014-02-23 DIAGNOSIS — Z87891 Personal history of nicotine dependence: Secondary | ICD-10-CM | POA: Insufficient documentation

## 2014-02-23 DIAGNOSIS — I6789 Other cerebrovascular disease: Secondary | ICD-10-CM | POA: Diagnosis not present

## 2014-02-23 DIAGNOSIS — I6381 Other cerebral infarction due to occlusion or stenosis of small artery: Secondary | ICD-10-CM

## 2014-02-23 NOTE — Progress Notes (Signed)
Echocardiogram performed.  

## 2014-03-02 ENCOUNTER — Encounter: Payer: Self-pay | Admitting: Nurse Practitioner

## 2014-03-08 ENCOUNTER — Ambulatory Visit (INDEPENDENT_AMBULATORY_CARE_PROVIDER_SITE_OTHER): Payer: Medicare Other

## 2014-03-08 ENCOUNTER — Ambulatory Visit (INDEPENDENT_AMBULATORY_CARE_PROVIDER_SITE_OTHER): Payer: Self-pay

## 2014-03-08 DIAGNOSIS — I6350 Cerebral infarction due to unspecified occlusion or stenosis of unspecified cerebral artery: Secondary | ICD-10-CM

## 2014-03-08 DIAGNOSIS — I6381 Other cerebral infarction due to occlusion or stenosis of small artery: Secondary | ICD-10-CM

## 2014-03-08 DIAGNOSIS — Z0289 Encounter for other administrative examinations: Secondary | ICD-10-CM

## 2014-03-29 ENCOUNTER — Telehealth: Payer: Self-pay | Admitting: *Deleted

## 2014-03-29 NOTE — Telephone Encounter (Signed)
LMVM for pt to return call for doppler results.  (normal carotid, TCD limited study due to absent temporal windows.  Possible mild narrowing of R vertebral in neck.).

## 2014-03-30 ENCOUNTER — Telehealth: Payer: Self-pay | Admitting: Neurology

## 2014-03-30 NOTE — Telephone Encounter (Signed)
Pt returning call and I gave her the results of Carotid doppler and TCD as per below.  She verbalized understanding.

## 2014-03-30 NOTE — Telephone Encounter (Signed)
See previous note re: doppler results.

## 2014-03-30 NOTE — Telephone Encounter (Signed)
Patient returning [place], [place] call from yesterday,

## 2014-05-31 ENCOUNTER — Encounter: Payer: Self-pay | Admitting: Nurse Practitioner

## 2014-05-31 ENCOUNTER — Ambulatory Visit (INDEPENDENT_AMBULATORY_CARE_PROVIDER_SITE_OTHER): Payer: Medicare Other | Admitting: Nurse Practitioner

## 2014-05-31 VITALS — BP 140/76 | HR 76 | Temp 98.5°F | Ht 62.5 in | Wt 155.0 lb

## 2014-05-31 DIAGNOSIS — I679 Cerebrovascular disease, unspecified: Secondary | ICD-10-CM

## 2014-05-31 DIAGNOSIS — I6381 Other cerebral infarction due to occlusion or stenosis of small artery: Secondary | ICD-10-CM

## 2014-05-31 DIAGNOSIS — I639 Cerebral infarction, unspecified: Secondary | ICD-10-CM

## 2014-05-31 NOTE — Progress Notes (Signed)
PATIENT: Tracey Hartman DOB: 08-05-1929  REASON FOR VISIT: routine follow up for HISTORY FROM: patient  HISTORY OF PRESENT ILLNESS: 68 year Caucasian lady who developed sudden onset of right hand tingling as well as weakness and gait imbalance in early April 2015. She could barely walk and use her hand to hold objects in right for several days. She subsequently noticed some improvement. She called her granddaughter who works in neuro ICU at [employer] who advised her to see her primary physician. She had CT scan of the head done on 11/02/2013 3 days after the event which I personally reviewed shows no definite acute abnormality but changes of small vessel disease. She has noticed improvement over the last couple of months and feels she has significant return of strength and is able to write and drip though she's not completely back to normal. She can also walk a lot better. The tingling in the hand has resolved. She has been started on baby aspirin which is tolerating well and also on a statin and metoprolol tartrate pressure. Patient had MRI scan of the brain done on 01/14/2014 which I personally reviewed and shows moderate changes of small vessel disease and several tiny microhemorrhages on gradient echo images. Intracranial and extracranial vascular imaging has not been performed and echo has also not been done. Hemoglobin A1c was borderline at 6.3 and lipid profile showed LDL of 94 mg percent. She has been tolerating her medications without side effect. She complains of some mild pressure on her scalp in the right side but denies classical symptoms of vision loss, jaw claudication or scalp tenderness. She has no known prior history of strokes or TIAs.   Update 06/01/14 (LL):  Tracey Hartman returns to the office for stroke followup. She states that the numbness in her right hand resolved but she still has minor loss of dexterity in the right hand. She continues to work full-time in the [employer] and is very active. She has been tolerating daily baby aspirin well without side effects. Blood pressure is well controlled, it is 140/76 in the office today. Her carotid doppler study in July showed no significant stenosis but her transcranial doppler did not yield useful results due to limited temporal windows. She has no new complaints today.  ROS:  14 system review of systems is positive for hand weakness and all the systems negative   ALLERGIES: Allergies  Allergen Reactions  . Aspirin   . Codeine     HOME MEDICATIONS: Outpatient Prescriptions Prior to Visit  Medication Sig Dispense Refill  . alendronate (FOSAMAX) 70 MG tablet Take 70 tablets by mouth once a week.    Marland Kitchen atorvastatin (LIPITOR) 40 MG tablet Take 40 tablets by mouth daily.    . metoprolol (LOPRESSOR) 50 MG tablet Take 50 tablets by mouth daily. Patient takes half a pill  25mg      No facility-administered medications prior to visit.    PHYSICAL EXAM Filed Vitals:   05/31/14 1535  BP: 140/76  Pulse: 76  Temp: 98.5 F (36.9 C)  TempSrc: Oral  Height: 5' 2.5" (1.588 m)  Weight: 155 lb (70.308 kg)   Body mass index is 27.88 kg/(m^2). No exam data present No flowsheet data found.  No flowsheet data found.  Physical Exam  General: Pleasant elderly Caucasian lady seated, in no evident distress  Head: head normocephalic and atraumatic. Orohparynx benign  Neck: supple with no carotid or supraclavicular bruits  Cardiovascular: regular rate and rhythm,  no murmurs  Musculoskeletal: Mild kyphoscoliosis  Skin: no rash/petichiae  Vascular: Normal pulses all extremities   Neurologic Exam  Mental Status: Awake and fully alert. Oriented to place and time. Recent and remote memory intact. Attention span, concentration and fund of knowledge appropriate. Mood and affect appropriate. Diminished recall 2/3. Animal naming 9. Clock drawing 4/4  Cranial Nerves: Fundoscopic exam not done. Pupils equal, briskly reactive  to light. Extraocular movements full without nystagmus. Visual fields full to confrontation. Hearing intact. Facial sensation intact. Face, tongue, palate moves normally and symmetrically.  Motor: Normal bulk and tone. Normal strength in all tested extremity muscles. Diminished fine finger movements on the right.  Sensory: intact to touch and pinprick and vibratory .  Coordination: Rapid alternating movements normal in all extremities. Finger-to-nose and heel-to-shin performed accurately bilaterally.  Gait and Station: Arises from chair without difficulty. Stance is normal. Gait demonstrates normal stride length and balance . Not able to heel, toe and tandem walk without difficulty.  Reflexes: 1+ and symmetric. Toes downgoing.  NIHSS 0  Modified Rankin 1   ASSESSMENT: 26 year Caucasian lady with right hand weakness and gait abnormality in April 2015 which has mostly improved likely from a small left brain subcortical infarct from lacunar disease. MRI scan of the brain done 2 months after the clinical event shows no acute infarct but extensive small vessel disease changes. Vascular risk factors of borderline hypertension, hyperlipidemia and diabetes.   PLAN:  I had a long discussion with the patient regarding her right hand weakness and test results. She does have evidence of significant small vessel disease and risk factors for stroke. Continue aspirin 81 mg daily for stroke prevention and strict control of hypertension with blood pressure goal below 130/90, lipids with LDL cholesterol goal below 100 mg percent and diabetes with hemoglobin A1c equal to 6.5%. I encouraged her to maintain adequate hydration.  Return for followup in 6 months with Dr. Leonie Man or call earlier if necessary.   Rudi Rummage Westin Knotts, MSN, FNP-BC, A/GNP-C 06/03/2014, 11:05 AM [facility] [address], [place], [place] [address] [phone]  Note: This document was prepared with digital dictation and  possible smart phrase technology. Any transcriptional errors that result from this process are unintentional.

## 2014-05-31 NOTE — Patient Instructions (Signed)
Continue aspirin 81 mg daily for stroke prevention and strict control of hypertension with blood pressure goal below 130/90, lipids with LDL cholesterol goal below 100 mg percent and diabetes with hemoglobin A1c equal to 6.5%. I encouraged her to maintain adequate hydration.  Return for followup in 6 months with Dr. Leonie Man or call earlier if necessary.    Stroke Prevention Some medical conditions and behaviors are associated with an increased chance of having a stroke. You may prevent a stroke by making healthy choices and managing medical conditions. HOW CAN I REDUCE MY RISK OF HAVING A STROKE?   Stay physically active. Get at least 30 minutes of activity on most or all days.  Do not smoke. It may also be helpful to avoid exposure to secondhand smoke.  Limit alcohol use. Moderate alcohol use is considered to be:  No more than 2 drinks per day for men.  No more than 1 drink per day for nonpregnant women.  Eat healthy foods. This involves:  Eating 5 or more servings of fruits and vegetables a day.  Making dietary changes that address high blood pressure (hypertension), high cholesterol, diabetes, or obesity.  Manage your cholesterol levels.  Making food choices that are high in fiber and low in saturated fat, trans fat, and cholesterol may control cholesterol levels.  Take any prescribed medicines to control cholesterol as directed by your health care provider.  Manage your diabetes.  Controlling your carbohydrate and sugar intake is recommended to manage diabetes.  Take any prescribed medicines to control diabetes as directed by your health care provider.  Control your hypertension.  Making food choices that are low in salt (sodium), saturated fat, trans fat, and cholesterol is recommended to manage hypertension.  Take any prescribed medicines to control hypertension as directed by your health care provider.  Maintain a healthy weight.  Reducing calorie intake and making  food choices that are low in sodium, saturated fat, trans fat, and cholesterol are recommended to manage weight.  Stop drug abuse.  Avoid taking birth control pills.  Talk to your health care provider about the risks of taking birth control pills if you are over 9 years old, smoke, get migraines, or have ever had a blood clot.  Get evaluated for sleep disorders (sleep apnea).  Talk to your health care provider about getting a sleep evaluation if you snore a lot or have excessive sleepiness.  Take medicines only as directed by your health care provider.  For some people, aspirin or blood thinners (anticoagulants) are helpful in reducing the risk of forming abnormal blood clots that can lead to stroke. If you have the irregular heart rhythm of atrial fibrillation, you should be on a blood thinner unless there is a good reason you cannot take them.  Understand all your medicine instructions.  Make sure that other conditions (such as anemia or atherosclerosis) are addressed. SEEK IMMEDIATE MEDICAL CARE IF:   You have sudden weakness or numbness of the face, arm, or leg, especially on one side of the body.  Your face or eyelid droops to one side.  You have sudden confusion.  You have trouble speaking (aphasia) or understanding.  You have sudden trouble seeing in one or both eyes.  You have sudden trouble walking.  You have dizziness.  You have a loss of balance or coordination.  You have a sudden, severe headache with no known cause.  You have new chest pain or an irregular heartbeat. Any of these symptoms may represent a  serious problem that is an emergency. Do not wait to see if the symptoms will go away. Get medical help at once. Call your local emergency services (911 in [place] Do not drive yourself to the hospital. Document Released: 08/27/2004 Document Revised: 12/04/2013 Document Reviewed: 01/20/2013 [facility] Patient Information 2015 [place], [place]. This information is  not intended to replace advice given to you by your health care provider. Make sure you discuss any questions you have with your health care provider.

## 2014-06-01 ENCOUNTER — Ambulatory Visit: Payer: PRIVATE HEALTH INSURANCE | Admitting: Nurse Practitioner

## 2014-06-03 ENCOUNTER — Encounter: Payer: Self-pay | Admitting: Nurse Practitioner

## 2014-06-03 MED ORDER — ASPIRIN EC 81 MG PO TBEC: 81.0000 mg | DELAYED_RELEASE_TABLET | Freq: Every day | ORAL | Status: DC

## 2014-06-12 NOTE — Progress Notes (Signed)
I agree with the above plan 

## 2014-07-30 DIAGNOSIS — I119 Hypertensive heart disease without heart failure: Secondary | ICD-10-CM | POA: Diagnosis not present

## 2014-07-30 DIAGNOSIS — K589 Irritable bowel syndrome without diarrhea: Secondary | ICD-10-CM | POA: Diagnosis not present

## 2014-07-30 DIAGNOSIS — E119 Type 2 diabetes mellitus without complications: Secondary | ICD-10-CM | POA: Diagnosis not present

## 2014-07-30 DIAGNOSIS — M810 Age-related osteoporosis without current pathological fracture: Secondary | ICD-10-CM | POA: Diagnosis not present

## 2014-07-30 DIAGNOSIS — N390 Urinary tract infection, site not specified: Secondary | ICD-10-CM | POA: Diagnosis not present

## 2014-07-30 DIAGNOSIS — Z0000 Encounter for general adult medical examination without abnormal findings: Secondary | ICD-10-CM | POA: Diagnosis not present

## 2014-07-30 DIAGNOSIS — I2510 Atherosclerotic heart disease of native coronary artery without angina pectoris: Secondary | ICD-10-CM | POA: Diagnosis not present

## 2014-07-30 DIAGNOSIS — E782 Mixed hyperlipidemia: Secondary | ICD-10-CM | POA: Diagnosis not present

## 2014-11-26 DIAGNOSIS — H43813 Vitreous degeneration, bilateral: Secondary | ICD-10-CM | POA: Diagnosis not present

## 2014-11-26 DIAGNOSIS — H01004 Unspecified blepharitis left upper eyelid: Secondary | ICD-10-CM | POA: Diagnosis not present

## 2014-11-26 DIAGNOSIS — H01001 Unspecified blepharitis right upper eyelid: Secondary | ICD-10-CM | POA: Diagnosis not present

## 2014-11-26 DIAGNOSIS — H04123 Dry eye syndrome of bilateral lacrimal glands: Secondary | ICD-10-CM | POA: Diagnosis not present

## 2015-01-29 DIAGNOSIS — E782 Mixed hyperlipidemia: Secondary | ICD-10-CM | POA: Diagnosis not present

## 2015-01-29 DIAGNOSIS — E119 Type 2 diabetes mellitus without complications: Secondary | ICD-10-CM | POA: Diagnosis not present

## 2015-01-29 DIAGNOSIS — I2510 Atherosclerotic heart disease of native coronary artery without angina pectoris: Secondary | ICD-10-CM | POA: Diagnosis not present

## 2015-01-29 DIAGNOSIS — I119 Hypertensive heart disease without heart failure: Secondary | ICD-10-CM | POA: Diagnosis not present

## 2015-08-30 DIAGNOSIS — M810 Age-related osteoporosis without current pathological fracture: Secondary | ICD-10-CM | POA: Diagnosis not present

## 2015-08-30 DIAGNOSIS — K589 Irritable bowel syndrome without diarrhea: Secondary | ICD-10-CM | POA: Diagnosis not present

## 2015-08-30 DIAGNOSIS — N390 Urinary tract infection, site not specified: Secondary | ICD-10-CM | POA: Diagnosis not present

## 2015-08-30 DIAGNOSIS — E1122 Type 2 diabetes mellitus with diabetic chronic kidney disease: Secondary | ICD-10-CM | POA: Diagnosis not present

## 2015-08-30 DIAGNOSIS — Z0000 Encounter for general adult medical examination without abnormal findings: Secondary | ICD-10-CM | POA: Diagnosis not present

## 2015-08-30 DIAGNOSIS — I1310 Hypertensive heart and chronic kidney disease without heart failure, with stage 1 through stage 4 chronic kidney disease, or unspecified chronic kidney disease: Secondary | ICD-10-CM | POA: Diagnosis not present

## 2015-08-30 DIAGNOSIS — I2510 Atherosclerotic heart disease of native coronary artery without angina pectoris: Secondary | ICD-10-CM | POA: Diagnosis not present

## 2015-08-30 DIAGNOSIS — E782 Mixed hyperlipidemia: Secondary | ICD-10-CM | POA: Diagnosis not present

## 2015-08-30 DIAGNOSIS — N183 Chronic kidney disease, stage 3 (moderate): Secondary | ICD-10-CM | POA: Diagnosis not present

## 2015-12-03 DIAGNOSIS — H04123 Dry eye syndrome of bilateral lacrimal glands: Secondary | ICD-10-CM | POA: Diagnosis not present

## 2015-12-03 DIAGNOSIS — H52203 Unspecified astigmatism, bilateral: Secondary | ICD-10-CM | POA: Diagnosis not present

## 2015-12-03 DIAGNOSIS — H01001 Unspecified blepharitis right upper eyelid: Secondary | ICD-10-CM | POA: Diagnosis not present

## 2015-12-03 DIAGNOSIS — H43813 Vitreous degeneration, bilateral: Secondary | ICD-10-CM | POA: Diagnosis not present

## 2016-03-18 DIAGNOSIS — E1122 Type 2 diabetes mellitus with diabetic chronic kidney disease: Secondary | ICD-10-CM | POA: Diagnosis not present

## 2016-03-18 DIAGNOSIS — I2510 Atherosclerotic heart disease of native coronary artery without angina pectoris: Secondary | ICD-10-CM | POA: Diagnosis not present

## 2016-03-18 DIAGNOSIS — N390 Urinary tract infection, site not specified: Secondary | ICD-10-CM | POA: Diagnosis not present

## 2016-03-18 DIAGNOSIS — E782 Mixed hyperlipidemia: Secondary | ICD-10-CM | POA: Diagnosis not present

## 2016-03-18 DIAGNOSIS — I1310 Hypertensive heart and chronic kidney disease without heart failure, with stage 1 through stage 4 chronic kidney disease, or unspecified chronic kidney disease: Secondary | ICD-10-CM | POA: Diagnosis not present

## 2016-03-18 DIAGNOSIS — R319 Hematuria, unspecified: Secondary | ICD-10-CM | POA: Diagnosis not present

## 2016-03-18 DIAGNOSIS — N183 Chronic kidney disease, stage 3 (moderate): Secondary | ICD-10-CM | POA: Diagnosis not present

## 2016-03-20 ENCOUNTER — Emergency Department (HOSPITAL_COMMUNITY)
Admission: EM | Admit: 2016-03-20 | Discharge: 2016-03-20 | Disposition: A | Discharge status: Home / Self Care | Payer: Medicare Other | Attending: Emergency Medicine | Admitting: Emergency Medicine

## 2016-03-20 ENCOUNTER — Encounter (HOSPITAL_COMMUNITY): Payer: Self-pay | Admitting: Emergency Medicine

## 2016-03-20 ENCOUNTER — Emergency Department (HOSPITAL_COMMUNITY): Payer: Medicare Other

## 2016-03-20 DIAGNOSIS — N132 Hydronephrosis with renal and ureteral calculous obstruction: Secondary | ICD-10-CM | POA: Diagnosis not present

## 2016-03-20 DIAGNOSIS — N1330 Unspecified hydronephrosis: Secondary | ICD-10-CM | POA: Insufficient documentation

## 2016-03-20 DIAGNOSIS — E119 Type 2 diabetes mellitus without complications: Secondary | ICD-10-CM | POA: Insufficient documentation

## 2016-03-20 DIAGNOSIS — I2510 Atherosclerotic heart disease of native coronary artery without angina pectoris: Secondary | ICD-10-CM | POA: Insufficient documentation

## 2016-03-20 DIAGNOSIS — Z87891 Personal history of nicotine dependence: Secondary | ICD-10-CM | POA: Insufficient documentation

## 2016-03-20 DIAGNOSIS — N201 Calculus of ureter: Secondary | ICD-10-CM | POA: Diagnosis not present

## 2016-03-20 DIAGNOSIS — Z7982 Long term (current) use of aspirin: Secondary | ICD-10-CM | POA: Diagnosis not present

## 2016-03-20 DIAGNOSIS — I10 Essential (primary) hypertension: Secondary | ICD-10-CM | POA: Insufficient documentation

## 2016-03-20 DIAGNOSIS — R109 Unspecified abdominal pain: Secondary | ICD-10-CM | POA: Diagnosis present

## 2016-03-20 HISTORY — DX: Atherosclerotic heart disease of native coronary artery without angina pectoris: I25.10

## 2016-03-20 LAB — CBC WITH DIFFERENTIAL/PLATELET
BASOS PCT: 1 %
Basophils Absolute: 0.1 10*3/uL (ref 0.0–0.1)
EOS PCT: 2 %
Eosinophils Absolute: 0.2 10*3/uL (ref 0.0–0.7)
HCT: 44.7 % (ref 36.0–46.0)
HEMOGLOBIN: 14.8 g/dL (ref 12.0–15.0)
Lymphocytes Relative: 16 %
Lymphs Abs: 1.5 10*3/uL (ref 0.7–4.0)
MCH: 33.9 pg (ref 26.0–34.0)
MCHC: 33.1 g/dL (ref 30.0–36.0)
MCV: 102.3 fL — ABNORMAL HIGH (ref 78.0–100.0)
MONOS PCT: 9 %
Monocytes Absolute: 0.8 10*3/uL (ref 0.1–1.0)
NEUTROS PCT: 72 %
Neutro Abs: 6.7 10*3/uL (ref 1.7–7.7)
PLATELETS: 163 10*3/uL (ref 150–400)
RBC: 4.37 MIL/uL (ref 3.87–5.11)
RDW: 12.3 % (ref 11.5–15.5)
WBC: 9.1 10*3/uL (ref 4.0–10.5)

## 2016-03-20 LAB — COMPREHENSIVE METABOLIC PANEL
ALK PHOS: 53 U/L (ref 38–126)
ALT: 32 U/L (ref 14–54)
ANION GAP: 5 (ref 5–15)
AST: 35 U/L (ref 15–41)
Albumin: 3.6 g/dL (ref 3.5–5.0)
BUN: 18 mg/dL (ref 6–20)
CO2: 25 mmol/L (ref 22–32)
Calcium: 9.3 mg/dL (ref 8.9–10.3)
Chloride: 107 mmol/L (ref 101–111)
Creatinine, Ser: 1.01 mg/dL — ABNORMAL HIGH (ref 0.44–1.00)
GFR calc Af Amer: 58 mL/min — ABNORMAL LOW (ref >60)
GFR calc non Af Amer: 50 mL/min — ABNORMAL LOW (ref >60)
Glucose, Bld: 133 mg/dL — ABNORMAL HIGH (ref 65–99)
POTASSIUM: 3.6 mmol/L (ref 3.5–5.1)
SODIUM: 137 mmol/L (ref 135–145)
Total Bilirubin: 1.1 mg/dL (ref 0.3–1.2)
Total Protein: 6.9 g/dL (ref 6.5–8.1)

## 2016-03-20 LAB — URINE MICROSCOPIC-ADD ON

## 2016-03-20 LAB — URINALYSIS, ROUTINE W REFLEX MICROSCOPIC
BILIRUBIN URINE: NEGATIVE
Glucose, UA: NEGATIVE mg/dL
Ketones, ur: NEGATIVE mg/dL
NITRITE: NEGATIVE
Protein, ur: NEGATIVE mg/dL
Specific Gravity, Urine: 1.018 (ref 1.005–1.030)
pH: 6 (ref 5.0–8.0)

## 2016-03-20 MED ORDER — CEPHALEXIN 500 MG PO CAPS: 500.0000 mg | ORAL_CAPSULE | Freq: Three times a day (TID) | ORAL | 0 refills | Status: DC

## 2016-03-20 MED ORDER — TRAMADOL HCL 50 MG PO TABS: 50.0000 mg | ORAL_TABLET | Freq: Four times a day (QID) | ORAL | 0 refills | Status: DC | PRN

## 2016-03-20 MED ORDER — FENTANYL CITRATE (PF) 100 MCG/2ML IJ SOLN
25.0000 ug | Freq: Once | INTRAMUSCULAR | Status: DC
Start: 2016-03-20 — End: 2016-03-20

## 2016-03-20 MED ORDER — SODIUM CHLORIDE 0.9 % IV BOLUS (SEPSIS)
1000.0000 mL | Freq: Once | INTRAVENOUS | Status: AC
  Administered 2016-03-20: 1000 mL via INTRAVENOUS

## 2016-03-20 MED ORDER — IBUPROFEN 800 MG PO TABS: 800.0000 mg | ORAL_TABLET | Freq: Three times a day (TID) | ORAL | 0 refills | Status: DC

## 2016-03-20 MED ORDER — CEFTRIAXONE SODIUM 1 G IJ SOLR
1.0000 g | Freq: Once | INTRAMUSCULAR | Status: AC
  Administered 2016-03-20: 1 g via INTRAVENOUS
  Filled 2016-03-20: qty 10

## 2016-03-20 NOTE — ED Triage Notes (Signed)
Lower to mid back pain, has blood in urine just getting over cystitis, has  Seen a dr for that but did not take antiobiotics,, burned when she  voids

## 2016-03-20 NOTE — ED Provider Notes (Signed)
[facility] DEPT Provider Note   CSN: [id number] Arrival date & time: 03/20/16  [id number]     History   Chief Complaint Chief Complaint  Patient presents with  . Back Pain    HPI Tracey Hartman is a 82 y.o. female.  Patient presents with a three-day history of left-sided lower back pain. Pain is progressively worsening and is constant. She had several episodes of vomiting 2 days ago but none since. Has irregular bowel movements at baseline which she says are unchanged. Denies any fevers. Denies any dysuria or hematuria. States her urine has been brown. She was seen by her doctor yesterday who thought she may have passed a kidney stone. She is given a prescription for Bactrim which she has not filled yet. She came in today because the pain is worse that she is not taking anything for it at home. Denies any weakness in her legs. Denies any numbness or tingling. Denies any chest pain or shortness of breath. Denies any history of kidney stone or back injury.   The history is provided by the patient and a relative.  Back Pain   Associated symptoms include abdominal pain and dysuria. Pertinent negatives include no chest pain, no fever, no headaches and no weakness.    Past Medical History:  Diagnosis Date  . Coronary artery disease   . Diabetes mellitus without complication ([place])   . Hypertension   . IBS (irritable bowel syndrome)   . Osteoporosis   . Recurrent UTI     Patient Active Problem List   Diagnosis Date Noted  . Lacunar infarction ([place]) 02/20/2014  . Small vessel disease, cerebrovascular 02/20/2014  . Abnormality of gait 02/20/2014  . HYPERCHOLESTEROLEMIA  IIA 08/25/2010  . CORONARY ATHEROSCLEROSIS NATIVE CORONARY ARTERY 08/25/2010  . CHEST PAIN, PRECORDIAL 08/25/2010    Past Surgical History:  Procedure Laterality Date  . APPENDECTOMY    . CATARACT EXTRACTION Bilateral   . COLONOSCOPY      OB History    No data available       Home Medications    Prior  to Admission medications   Medication Sig Start Date End Date Taking? Authorizing Provider  alendronate (FOSAMAX) 70 MG tablet Take 70 tablets by mouth once a week. 01/29/14   Historical Provider, MD  aspirin EC 81 MG tablet Take 1 tablet (81 mg total) by mouth daily. 06/03/14   Philmore Pali, NP  atorvastatin (LIPITOR) 40 MG tablet Take 40 tablets by mouth daily. 01/24/14   Historical Provider, MD  metoprolol (LOPRESSOR) 50 MG tablet Take 50 tablets by mouth daily. Patient takes half a pill  25mg  02/13/14   Historical Provider, MD    Family History Family History  Problem Relation Age of Onset  . Kidney failure Mother     renal failure  . Heart attack Father     Social History Social History  Substance Use Topics  . Smoking status: Former Research scientist (life sciences)  . Smokeless tobacco: Never Used  . Alcohol use No     Comment: patient drinks coffee     Allergies   Codeine   Review of Systems Review of Systems  Constitutional: Positive for activity change, appetite change and fatigue. Negative for chills, diaphoresis and fever.  HENT: Negative for congestion and nosebleeds.   Respiratory: Negative for cough, chest tightness and shortness of breath.   Cardiovascular: Negative for chest pain and palpitations.  Gastrointestinal: Positive for abdominal pain, diarrhea, nausea and vomiting.  Genitourinary: Positive for dysuria and  flank pain. Negative for hematuria, vaginal bleeding and vaginal discharge.  Musculoskeletal: Positive for back pain. Negative for neck stiffness.  Skin: Negative for rash.  Neurological: Negative for dizziness, weakness and headaches.   A complete 10 system review of systems was obtained and all systems are negative except as noted in the HPI and PMH.    Physical Exam Updated Vital Signs BP 144/82   Temp 97.4 F (36.3 C) (Oral)   Physical Exam  Constitutional: She is oriented to person, place, and time. She appears well-developed and well-nourished. No distress.    HENT:  Head: Normocephalic and atraumatic.  Mouth/Throat: Oropharynx is clear and moist. No oropharyngeal exudate.  Eyes: Conjunctivae and EOM are normal. Pupils are equal, round, and reactive to light.  Neck: Normal range of motion. Neck supple.  No meningismus.  Cardiovascular: Normal rate, regular rhythm, normal heart sounds and intact distal pulses.   No murmur heard. Pulmonary/Chest: Effort normal and breath sounds normal. No respiratory distress.  Abdominal: Soft. There is no rebound and no guarding.  L sided abdominal tenderness  Musculoskeletal: Normal range of motion. She exhibits tenderness. She exhibits no edema.  L CVAT. No midline tenderness 5/5 strength in bilateral lower extremities. Ankle plantar and dorsiflexion intact. Great toe extension intact bilaterally. +2 DP and PT pulses. +2 patellar reflexes bilaterally. Normal gait.   Neurological: She is alert and oriented to person, place, and time. No cranial nerve deficit. She exhibits normal muscle tone. Coordination normal.  No ataxia on finger to nose bilaterally. No pronator drift. 5/5 strength throughout. CN 2-12 intact.Equal grip strength. Sensation intact.   Skin: Skin is warm.  Psychiatric: She has a normal mood and affect. Her behavior is normal.  Nursing note and vitals reviewed.    ED Treatments / Results  Labs (all labs ordered are listed, but only abnormal results are displayed) Labs Reviewed  URINALYSIS, [place] (NOT AT [facility]) - Abnormal; Notable for the following:       Result Value   Hgb urine dipstick LARGE (*)    Leukocytes, UA SMALL (*)    All other components within normal limits  CBC WITH DIFFERENTIAL/PLATELET - Abnormal; Notable for the following:    MCV 102.3 (*)    All other components within normal limits  COMPREHENSIVE METABOLIC PANEL - Abnormal; Notable for the following:    Glucose, Bld 133 (*)    Creatinine, Ser 1.01 (*)    GFR calc non Af Amer 50 (*)    GFR calc  Af Amer 58 (*)    All other components within normal limits  URINE MICROSCOPIC-ADD ON - Abnormal; Notable for the following:    Squamous Epithelial / LPF 0-5 (*)    Bacteria, UA FEW (*)    All other components within normal limits  URINE CULTURE    EKG  EKG Interpretation None       Radiology Ct Renal Stone Study  Result Date: 03/20/2016 CLINICAL DATA:  Intermittent left flank pain. EXAM: CT ABDOMEN AND PELVIS WITHOUT CONTRAST TECHNIQUE: Multidetector CT imaging of the abdomen and pelvis was performed following the standard protocol without IV contrast. COMPARISON:  07/24/2011 FINDINGS: Lower chest: Dependent atelectasis at the lung bases. No pleural effusions. Hepatobiliary: Normal appearance of the liver and gallbladder. Pancreas: Normal appearance of the pancreas without inflammation or duct dilatation. Spleen: Normal appearance of spleen without enlargement. Adrenals/Urinary Tract: Normal adrenal glands. Normal appearance of the urinary bladder. Mild fullness of the right renal pelvis without hydronephrosis.  No right kidney stones. Probable right renal cysts. Moderate left hydronephrosis with edema around the left renal pelvis and proximal left ureter. 7 mm stone in the proximal left ureter. Large exophytic cyst involving the left kidney lower pole measuring up to 6.1 cm. Stomach/Bowel: Normal appearance of stomach, small bowel and colon. No bowel obstruction. Vascular/Lymphatic: Atherosclerotic calcifications in the aorta and iliac arteries without aortic aneurysm. No suspicious lymphadenopathy. Reproductive: No gross abnormality to the uterus or adnexal structures. Evidence for tubal ligation clips. Other: No free fluid.  No free air. Musculoskeletal: No acute bone abnormality. Mild retrolisthesis and disc space narrowing at L5-S1. IMPRESSION: Moderate left hydronephrosis due to a 7 mm stone in the proximal left ureter. Renal cysts. Electronically Signed   By: Markus Daft M.D.   On:  03/20/2016 10:21    Procedures Procedures (including critical care time)  Medications Ordered in ED Medications  sodium chloride 0.9 % bolus 1,000 mL (0 mLs Intravenous Stopped 03/20/16 1002)  cefTRIAXone (ROCEPHIN) 1 g in dextrose 5 % 50 mL IVPB (0 g Intravenous Stopped 03/20/16 1142)     Initial Impression / Assessment and Plan / ED Course  I have reviewed the triage vital signs and the nursing notes.  Pertinent labs & imaging results that were available during my care of the patient were reviewed by me and considered in my medical decision making (see chart for details).  Clinical Course  3 days of flank pain with intermittent vomiting. No chest pain or shortness of breath.  Urinalysis suspicious for infection with red cells and white cells and positive leukocytes. We'll sent culture. Patient declines pain medication in the ED.  CT shows 7 mm proximal left ureteral stone with moderate hydronephrosis  Discussed with urology Dr. Virginia Crews, possibility of infected kidney stone. Patient is well-appearing. She is afebrile with no leukocytosis. Her pain is well-controlled. He agrees with outpatient follow-up. Patient has bactrim from her PCP yesterday which she has not yet started.  Pain controlled in the ED. Patient well appearing.  Followup with urology this week. Return to the ED with worsening pain, fever, vomiting, or any other concerns. Final Clinical Impressions(s) / ED Diagnoses   Final diagnoses:  Ureteral stone    New Prescriptions New Prescriptions   No medications on file     Ezequiel Essex, MD 03/20/16 1721

## 2016-03-20 NOTE — ED Notes (Signed)
Pt taken to CT.

## 2016-03-20 NOTE — Discharge Instructions (Signed)
Take the pain medications and antibiotics as prescribed. Follow-up with the urologist. Return to the ED develop worsening pain, vomiting, fever or any other concerns.

## 2016-03-20 NOTE — ED Notes (Signed)
Pt ambulated to and from restroom; pt c/o of mild lower back pain (1/10) upon return from restroom; RN notified

## 2016-03-21 LAB — URINE CULTURE: Culture: 50000 — AB

## 2016-03-22 ENCOUNTER — Telehealth (HOSPITAL_BASED_OUTPATIENT_CLINIC_OR_DEPARTMENT_OTHER): Payer: Self-pay

## 2016-03-22 NOTE — Telephone Encounter (Signed)
Post ED Visit - Positive Culture Follow-up  Culture report reviewed by antimicrobial stewardship pharmacist:  []  Elenor Quinones, Pharm.D. []  Heide Guile, Pharm.D., BCPS []  Parks Neptune, Pharm.D. []  Alycia Rossetti, Pharm.D., BCPS []  [place], Pharm.D., BCPS, AAHIVP []  Legrand Como, Pharm.D., BCPS, AAHIVP []  Milus Glazier, Pharm.D. []  Stephens November, Pharm.D. Rachel Rumbarger Pharm D Positive urine culture Treated with Cephalexin, organism sensitive to the same and no further patient follow-up is required at this time.  Genia Del 03/22/2016, 11:19 AM

## 2016-03-31 DIAGNOSIS — R8290 Unspecified abnormal findings in urine: Secondary | ICD-10-CM | POA: Diagnosis not present

## 2016-09-01 DIAGNOSIS — N183 Chronic kidney disease, stage 3 (moderate): Secondary | ICD-10-CM | POA: Diagnosis not present

## 2016-09-01 DIAGNOSIS — E782 Mixed hyperlipidemia: Secondary | ICD-10-CM | POA: Diagnosis not present

## 2016-09-01 DIAGNOSIS — D72829 Elevated white blood cell count, unspecified: Secondary | ICD-10-CM | POA: Diagnosis not present

## 2016-09-01 DIAGNOSIS — E1122 Type 2 diabetes mellitus with diabetic chronic kidney disease: Secondary | ICD-10-CM | POA: Diagnosis not present

## 2016-09-01 DIAGNOSIS — I2510 Atherosclerotic heart disease of native coronary artery without angina pectoris: Secondary | ICD-10-CM | POA: Diagnosis not present

## 2016-09-01 DIAGNOSIS — N390 Urinary tract infection, site not specified: Secondary | ICD-10-CM | POA: Diagnosis not present

## 2016-09-01 DIAGNOSIS — I1310 Hypertensive heart and chronic kidney disease without heart failure, with stage 1 through stage 4 chronic kidney disease, or unspecified chronic kidney disease: Secondary | ICD-10-CM | POA: Diagnosis not present

## 2016-09-01 DIAGNOSIS — M810 Age-related osteoporosis without current pathological fracture: Secondary | ICD-10-CM | POA: Diagnosis not present

## 2016-09-01 DIAGNOSIS — Z0000 Encounter for general adult medical examination without abnormal findings: Secondary | ICD-10-CM | POA: Diagnosis not present

## 2016-09-01 DIAGNOSIS — K589 Irritable bowel syndrome without diarrhea: Secondary | ICD-10-CM | POA: Diagnosis not present

## 2016-11-05 DIAGNOSIS — M810 Age-related osteoporosis without current pathological fracture: Secondary | ICD-10-CM | POA: Diagnosis not present

## 2016-12-16 DIAGNOSIS — H43813 Vitreous degeneration, bilateral: Secondary | ICD-10-CM | POA: Diagnosis not present

## 2016-12-16 DIAGNOSIS — H04123 Dry eye syndrome of bilateral lacrimal glands: Secondary | ICD-10-CM | POA: Diagnosis not present

## 2016-12-16 DIAGNOSIS — H524 Presbyopia: Secondary | ICD-10-CM | POA: Diagnosis not present

## 2016-12-16 DIAGNOSIS — H01001 Unspecified blepharitis right upper eyelid: Secondary | ICD-10-CM | POA: Diagnosis not present

## 2017-03-01 ENCOUNTER — Encounter: Payer: Self-pay | Admitting: Neurology

## 2017-03-01 ENCOUNTER — Ambulatory Visit (INDEPENDENT_AMBULATORY_CARE_PROVIDER_SITE_OTHER): Payer: Medicare Other | Admitting: Neurology

## 2017-03-01 VITALS — BP 170/90 | HR 70 | Ht 62.0 in | Wt 143.4 lb

## 2017-03-01 DIAGNOSIS — R269 Unspecified abnormalities of gait and mobility: Secondary | ICD-10-CM

## 2017-03-01 DIAGNOSIS — G3184 Mild cognitive impairment, so stated: Secondary | ICD-10-CM

## 2017-03-01 NOTE — Progress Notes (Signed)
[facility] [address] [place]. [place] [address] [phone]       OFFICE CONSULT NOTE  Tracey. Tracey Hartman Date of Birth:  [birth year] Medical Record Number:  [id number]   Referring MD:   self  Reason for Referral:  Memory and balance difficulties  HPI: Tracey Hartman is a pleasant 84 year old Caucasian lady who is accompanied today by her son and granddaughter. She has been having some memory and cognitive difficulties for several months. She attributes this due to significant stress that she is going through. She is in the process of selling her house and is currently building a house in [place] where she plans to move to be close to her family. 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 She had times feels she has trouble remembering recent information as well as at times finding words and completing sentences. Her son feels that her communication is not as crisp as it used to be and she has trouble reading words out and cannot think and sharply. Patient is still independent and manages all activities of daily living. She denies any significant headaches, head injury with loss of consciousness, seizures. She has a prior history of small stroke in July 2015 when she saw me for episode of gait ataxia and some mild right-sided weakness which lasted several days this event and MRI scan of the brain done on 01/14/2014 which I personally reviewed and shows moderate changes of small vessel disease and several tiny microhemorrhages on gradient echo images. Intracranial and extracranial vascular imaging has not been performed and echo has also not been done. Hemoglobin A1c was borderline at 6.3 and lipid profile showed LDL of 94 mg percent She is on aspirin daily which is tolerating well without side effects. She is also on pravastatin and is tolerating it well without muscle aches or pains. She also takes fish oil daily. She's had some mild  balance difficulties for long time but feels awful 8 when she makes a sudden turn or tries to walk quickly she is insecure. She is a had a couple of minor falls but has not had fortunately any major injuries. She denies any tingling numbness burning or pain in her feet. She denies feeling depressed but does admit to some anxiety due to her move. There is no history of episodes of confusion, disorientation. She still driving and has never gotten lost. There is no history of delusions, hallucinations or agitation or abnormal behavior  ROS:   14 system review of systems is positive for  memory loss, word finding difficulty, dizziness, imbalance, walking difficulty and all other systems negative  PMH:  Past Medical History:  Diagnosis Date  . Coronary artery disease   . Diabetes mellitus without complication ([place])   . Hypertension   . IBS (irritable bowel syndrome)   . Osteoporosis   . Recurrent UTI   . Stroke [facility])     Social History:  Social History   Social History  . Marital status: Married    Spouse name: N/A  . Number of children: 4  . Years of education: 12th   Occupational History  . claims  [employer]   Social History Main Topics  . Smoking status: Former Research scientist (life sciences)  . Smokeless tobacco: Never Used  . Alcohol use No     Comment: patient drinks coffee  . Drug use: No  . Sexual activity: No   Other Topics Concern  . Not on  file   Social History Narrative   Patient lives at home alone    Patient is right handed   Patient drinks coffee daily    Medications:   Current Outpatient Prescriptions on File Prior to Visit  Medication Sig Dispense Refill  . aspirin EC 81 MG tablet Take 1 tablet (81 mg total) by mouth daily.    . calcium-vitamin D (OSCAL WITH D) 500-200 MG-UNIT tablet Take 1 tablet by mouth daily with breakfast.    . Cranberry 1000 MG CAPS Take 1,000 mg by mouth daily.    Marland Kitchen ibuprofen (ADVIL,MOTRIN) 800 MG tablet Take 1 tablet (800 mg total) by mouth 3  (three) times daily. (Patient taking differently: Take 800 mg by mouth every 6 (six) hours as needed. ) 21 tablet 0  . metoprolol (LOPRESSOR) 50 MG tablet Take 50 tablets by mouth daily. Patient takes half a pill  25mg  daily    . Multiple Vitamins-Minerals (MULTIVITAMIN WITH MINERALS) tablet Take 1 tablet by mouth daily.    . Omega-3 Fatty Acids (FISH OIL) 1000 MG CPDR Take 2,000 mg by mouth daily.     . traMADol (ULTRAM) 50 MG tablet Take 1 tablet (50 mg total) by mouth every 6 (six) hours as needed. 15 tablet 0   No current facility-administered medications on file prior to visit.     Allergies:   Allergies  Allergen Reactions  . Codeine     Physical Exam General: well developed, well nourished, seated, in no evident distress Head: head normocephalic and atraumatic.   Neck: supple with no carotid or supraclavicular bruits Cardiovascular: regular rate and rhythm, no murmurs Musculoskeletal: no deformity Skin:  no rash/petichiae Vascular:  Normal pulses all extremities  Neurologic Exam Mental Status: Awake and fully alert. Oriented to place and time. Recent and remote memory intact. Attention span, concentration and fund of knowledge appropriate. Mood and affect appropriate. Mini-Mental status exam scored 29/30 with only one deficit. Clock drawing 3/4. Animal naming only 5. Geriatric depression scale 0 not depressed. Cranial Nerves: Fundoscopic exam reveals sharp disc margins. Pupils equal, briskly reactive to light. Extraocular movements full without nystagmus. Visual fields full to confrontation. Hearing intact. Facial sensation intact. Face, tongue, palate moves normally and symmetrically.  Motor: Normal bulk and tone. Normal strength in all tested extremity muscles. Sensory.: intact to touch , pinprick , position and vibratory sensation.  Coordination: Rapid alternating movements normal in all extremities. Finger-to-nose and heel-to-shin performed accurately bilaterally. Gait and  Station: Arises from chair without difficulty. Stance is normal. Gait demonstrates normal stride length and balance . Able to heel, toe and tandem walk with moderate difficulty.  Reflexes: 1+ and symmetric. Toes downgoing.       ASSESSMENT: 5 year Caucasian lady with mild memory and cognitive difficulties likely from mild cognitive impairment. Mild gait and balance difficulties likely multifactorial secondary to age-related small vessel disease and prior strokes history.    PLAN: I had a long discussion with the patient, her son and granddaughter about her mild memory difficulties which are likely from mild cognitive impairment. She also has mild gait imbalance which may be related to age-related small vessel disease changes. I recommend checking MRI scan of the brain, memory panel labs and EEG. She was encouraged to continue to take fish oil and start Reservatrol 200 mg daily or Prevagen supplement and continue participation in mentally challenging activities like solving crossword puzzles, playing bridge, sudoku. We also discussed fall and safety precautions. Continue aspirin for stroke prevention with strict  control of hypertension with blood pressure goal below 130/90. Greater than 50% time during this 45 minute consultation visit was spent on counseling and coordination of care about her gait imbalance, memory loss and mild cognitive impairment and answered questions .She will return for follow-up in 2 months or call earlier if necessary. Antony Contras, MD  [facility] [address] [place],  [address]  Phone [phone] Fax [phone] Note: This document was prepared with digital dictation and possible smart phrase technology. Any transcriptional errors that result from this process are unintentional.

## 2017-03-01 NOTE — Patient Instructions (Signed)
I had a long discussion with the patient, her son and granddaughter about her mild memory difficulties which are likely from mild cognitive impairment. She also has mild gait imbalance which may be related to age-related small vessel disease changes. I recommend checking MRI scan of the brain, memory panel labs and EEG. She was encouraged to continue to take fish oil and start Reservatrol 200 mg daily or Prevagen supplement and continue participation in mentally challenging activities like solving crossword puzzles, playing bridge, sudoku. We also discussed fall and safety precautions. Continue aspirin for stroke prevention with strict control of hypertension with blood pressure goal below 130/90. She will return for follow-up in 2 months or call earlier if necessary.  Fall Prevention in the Home Falls can cause injuries and can affect people from all age groups. There are many simple things that you can do to make your home safe and to help prevent falls. What can I do on the outside of my home?  Regularly repair the edges of walkways and driveways and fix any cracks.  Remove high doorway thresholds.  Trim any shrubbery on the main path into your home.  Use bright outdoor lighting.  Clear walkways of debris and clutter, including tools and rocks.  Regularly check that handrails are securely fastened and in good repair. Both sides of any steps should have handrails.  Install guardrails along the edges of any raised decks or porches.  Have leaves, snow, and ice cleared regularly.  Use sand or salt on walkways during winter months.  In the garage, clean up any spills right away, including grease or oil spills. What can I do in the bathroom?  Use night lights.  Install grab bars by the toilet and in the tub and shower. Do not use towel bars as grab bars.  Use non-skid mats or decals on the floor of the tub or shower.  If you need to sit down while you are in the shower, use a plastic,  non-slip stool.  Keep the floor dry. Immediately clean up any water that spills on the floor.  Remove soap buildup in the tub or shower on a regular basis.  Attach bath mats securely with double-sided non-slip rug tape.  Remove throw rugs and other tripping hazards from the floor. What can I do in the bedroom?  Use night lights.  Make sure that a bedside light is easy to reach.  Do not use oversized bedding that drapes onto the floor.  Have a firm chair that has side arms to use for getting dressed.  Remove throw rugs and other tripping hazards from the floor. What can I do in the kitchen?  Clean up any spills right away.  Avoid walking on wet floors.  Place frequently used items in easy-to-reach places.  If you need to reach for something above you, use a sturdy step stool that has a grab bar.  Keep electrical cables out of the way.  Do not use floor polish or wax that makes floors slippery. If you have to use wax, make sure that it is non-skid floor wax.  Remove throw rugs and other tripping hazards from the floor. What can I do in the stairways?  Do not leave any items on the stairs.  Make sure that there are handrails on both sides of the stairs. Fix handrails that are broken or loose. Make sure that handrails are as long as the stairways.  Check any carpeting to make sure that it is firmly  attached to the stairs. Fix any carpet that is loose or worn.  Avoid having throw rugs at the top or bottom of stairways, or secure the rugs with carpet tape to prevent them from moving.  Make sure that you have a light switch at the top of the stairs and the bottom of the stairs. If you do not have them, have them installed. What are some other fall prevention tips?  Wear closed-toe shoes that fit well and support your feet. Wear shoes that have rubber soles or low heels.  When you use a stepladder, make sure that it is completely opened and that the sides are firmly locked.  Have someone hold the ladder while you are using it. Do not climb a closed stepladder.  Add color or contrast paint or tape to grab bars and handrails in your home. Place contrasting color strips on the first and last steps.  Use mobility aids as needed, such as canes, walkers, scooters, and crutches.  Turn on lights if it is dark. Replace any light bulbs that burn out.  Set up furniture so that there are clear paths. Keep the furniture in the same spot.  Fix any uneven floor surfaces.  Choose a carpet design that does not hide the edge of steps of a stairway.  Be aware of any and all pets.  Review your medicines with your healthcare provider. Some medicines can cause dizziness or changes in blood pressure, which increase your risk of falling. Talk with your health care provider about other ways that you can decrease your risk of falls. This may include working with a physical therapist or trainer to improve your strength, balance, and endurance. This information is not intended to replace advice given to you by your health care provider. Make sure you discuss any questions you have with your health care provider. Document Released: 07/10/2002 Document Revised: 12/17/2015 Document Reviewed: 08/24/2014 Elsevier Interactive Patient Education  2017 [employer].

## 2017-03-02 LAB — DEMENTIA PANEL
Homocysteine: 13.1 umol/L (ref 0.0–15.0)
RPR Ser Ql: NONREACTIVE
TSH: 2.5 u[IU]/mL (ref 0.450–4.500)
Vitamin B-12: 560 pg/mL (ref 232–1245)

## 2017-03-03 ENCOUNTER — Telehealth: Payer: Self-pay

## 2017-03-03 ENCOUNTER — Ambulatory Visit (INDEPENDENT_AMBULATORY_CARE_PROVIDER_SITE_OTHER): Payer: Medicare Other | Admitting: Neurology

## 2017-03-03 DIAGNOSIS — R2990 Unspecified symptoms and signs involving the nervous system: Secondary | ICD-10-CM | POA: Diagnosis not present

## 2017-03-03 DIAGNOSIS — G3184 Mild cognitive impairment, so stated: Secondary | ICD-10-CM

## 2017-03-03 DIAGNOSIS — R269 Unspecified abnormalities of gait and mobility: Secondary | ICD-10-CM

## 2017-03-03 NOTE — Telephone Encounter (Signed)
Rn call patients son MIchael to give lab results to. Rn stated the memory panel was normal. Pts son verbalized understanding.

## 2017-03-03 NOTE — Telephone Encounter (Signed)
-----   Message from Garvin Fila, MD sent at 03/02/2017  6:04 PM EDT ----- Mitchell Heir inform the patient that memory panel labs were all normal

## 2017-03-05 ENCOUNTER — Telehealth: Payer: Self-pay

## 2017-03-05 NOTE — Telephone Encounter (Signed)
-----   Message from Garvin Fila, MD sent at 03/03/2017  5:08 PM EDT ----- Mitchell Heir call the patient and determined that EEG study did not show evidence of seizure activity but does show mild slowing of brain wave activity which is a very common nonspecific finding and nothing to worry about

## 2017-03-05 NOTE — Telephone Encounter (Signed)
Left vm for patients son to call back on  Monday during business hours, Rn stated it was not a urgent call. Left contact number to call back.

## 2017-03-05 NOTE — Telephone Encounter (Signed)
Rn call patients son back and explained that the EEG study did not show evidence of seizure activity but does show mild slowing of brain wave activity which is a very common nonspecific finding and nothing to worry about. Pts son verbalized understanding.

## 2017-03-11 ENCOUNTER — Ambulatory Visit
Admission: RE | Admit: 2017-03-11 | Discharge: 2017-03-11 | Disposition: A | Discharge status: Home / Self Care | Payer: Medicare Other | Source: Ambulatory Visit | Attending: Neurology | Admitting: Neurology

## 2017-03-11 DIAGNOSIS — G3184 Mild cognitive impairment, so stated: Secondary | ICD-10-CM | POA: Diagnosis not present

## 2017-03-11 DIAGNOSIS — R269 Unspecified abnormalities of gait and mobility: Secondary | ICD-10-CM

## 2017-03-11 MED ORDER — GADOBENATE DIMEGLUMINE 529 MG/ML IV SOLN
14.0000 mL | Freq: Once | INTRAVENOUS | Status: AC | PRN
  Administered 2017-03-11: 14 mL via INTRAVENOUS

## 2017-03-23 ENCOUNTER — Other Ambulatory Visit: Payer: Self-pay | Admitting: Neurology

## 2017-03-23 DIAGNOSIS — E782 Mixed hyperlipidemia: Secondary | ICD-10-CM

## 2017-03-23 DIAGNOSIS — I6381 Other cerebral infarction due to occlusion or stenosis of small artery: Secondary | ICD-10-CM

## 2017-03-23 DIAGNOSIS — E0800 Diabetes mellitus due to underlying condition with hyperosmolarity without nonketotic hyperglycemic-hyperosmolar coma (NKHHC): Secondary | ICD-10-CM

## 2017-03-25 ENCOUNTER — Ambulatory Visit (HOSPITAL_COMMUNITY): Payer: Medicare Other | Attending: Internal Medicine

## 2017-03-25 ENCOUNTER — Other Ambulatory Visit: Payer: Self-pay

## 2017-03-25 DIAGNOSIS — Z87891 Personal history of nicotine dependence: Secondary | ICD-10-CM | POA: Diagnosis not present

## 2017-03-25 DIAGNOSIS — I2510 Atherosclerotic heart disease of native coronary artery without angina pectoris: Secondary | ICD-10-CM | POA: Insufficient documentation

## 2017-03-25 DIAGNOSIS — I639 Cerebral infarction, unspecified: Secondary | ICD-10-CM | POA: Insufficient documentation

## 2017-03-25 DIAGNOSIS — E785 Hyperlipidemia, unspecified: Secondary | ICD-10-CM | POA: Diagnosis not present

## 2017-03-25 DIAGNOSIS — E0800 Diabetes mellitus due to underlying condition with hyperosmolarity without nonketotic hyperglycemic-hyperosmolar coma (NKHHC): Secondary | ICD-10-CM

## 2017-03-25 DIAGNOSIS — E782 Mixed hyperlipidemia: Secondary | ICD-10-CM | POA: Diagnosis not present

## 2017-03-25 DIAGNOSIS — I358 Other nonrheumatic aortic valve disorders: Secondary | ICD-10-CM | POA: Insufficient documentation

## 2017-03-25 DIAGNOSIS — I6381 Other cerebral infarction due to occlusion or stenosis of small artery: Secondary | ICD-10-CM

## 2017-03-31 ENCOUNTER — Telehealth: Payer: Self-pay

## 2017-03-31 NOTE — Telephone Encounter (Signed)
Left vm for patients son Tracey Hartman on dpr about his moms echocardiogram study results.Left vm to call back during business hours.

## 2017-03-31 NOTE — Telephone Encounter (Signed)
-----   Message from Garvin Fila, MD sent at 03/26/2017  2:51 PM EDT ----- Tracey Hartman inform the patient and echocardiogram study was normal. Mild thickening of heart valve and decrease pumping action compared with previous study but nothing to worry about

## 2017-04-06 NOTE — Telephone Encounter (Signed)
Left 2nd vm for patients son MIcheal about his moms echo results.

## 2017-04-07 NOTE — Telephone Encounter (Signed)
Patients son Legrand Como called office returnin RN's call in reference to ECHO results.  Please call

## 2017-04-20 ENCOUNTER — Ambulatory Visit
Admission: RE | Admit: 2017-04-20 | Discharge: 2017-04-20 | Disposition: A | Discharge status: Home / Self Care | Payer: Medicare Other | Source: Ambulatory Visit | Attending: Neurology | Admitting: Neurology

## 2017-04-20 DIAGNOSIS — I6381 Other cerebral infarction due to occlusion or stenosis of small artery: Secondary | ICD-10-CM

## 2017-04-20 DIAGNOSIS — I639 Cerebral infarction, unspecified: Secondary | ICD-10-CM | POA: Diagnosis not present

## 2017-04-20 DIAGNOSIS — I63233 Cerebral infarction due to unspecified occlusion or stenosis of bilateral carotid arteries: Secondary | ICD-10-CM | POA: Diagnosis not present

## 2017-04-20 DIAGNOSIS — E782 Mixed hyperlipidemia: Secondary | ICD-10-CM

## 2017-04-20 DIAGNOSIS — E0800 Diabetes mellitus due to underlying condition with hyperosmolarity without nonketotic hyperglycemic-hyperosmolar coma (NKHHC): Secondary | ICD-10-CM

## 2017-04-20 MED ORDER — IOPAMIDOL (ISOVUE-370) INJECTION 76%
75.0000 mL | Freq: Once | INTRAVENOUS | Status: AC | PRN
  Administered 2017-04-20: 75 mL via INTRAVENOUS

## 2017-04-22 ENCOUNTER — Telehealth: Payer: Self-pay | Admitting: Neurology

## 2017-04-22 NOTE — Telephone Encounter (Signed)
I called patient`s number but was unable to leave message as voicemail box was not set up. 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 Patient is clinically doing fine hence I recommend conservative medical follow-up. She is high risk for bleeding secondary to significant microvascular disease on brain MRI and microhemorrhages. She has an upcoming appointment to see me which I advised him to keep. The patient's son stated he would inform his mother about the results and she can call me if she has questions. I had previously called the son yesterday see note below but son did not remember my name Notes recorded by Garvin Fila, MD on 04/20/2017 at 4:24 PM EDT I spoke to the patient's son Tamieka Rancourt and communicative results of CT angiogram showing no significant large vessel stenosis in the brain but significant calcification and narrowing at the origin of the great vessels in the neck. This is an area which is difficult to operate upon. Patient is at high risk for bleeding given significant microhemorrhages and advanced small vessel disease hence I do not recommend angioplasty stenting at the present time unless she has recurrent TIA or strokes. He voiced understanding and stated he would communicate the results to his mother

## 2017-04-28 ENCOUNTER — Telehealth (HOSPITAL_COMMUNITY): Payer: Self-pay

## 2017-04-28 ENCOUNTER — Encounter: Payer: Self-pay | Admitting: Neurology

## 2017-04-28 ENCOUNTER — Ambulatory Visit (INDEPENDENT_AMBULATORY_CARE_PROVIDER_SITE_OTHER): Payer: Medicare Other | Admitting: Neurology

## 2017-04-28 VITALS — BP 126/78 | HR 71 | Wt 141.6 lb

## 2017-04-28 DIAGNOSIS — G450 Vertebro-basilar artery syndrome: Secondary | ICD-10-CM | POA: Diagnosis not present

## 2017-04-28 DIAGNOSIS — R42 Dizziness and giddiness: Secondary | ICD-10-CM

## 2017-04-28 DIAGNOSIS — I639 Cerebral infarction, unspecified: Secondary | ICD-10-CM

## 2017-04-28 MED ORDER — CLOPIDOGREL BISULFATE 75 MG PO TABS: 75.0000 mg | ORAL_TABLET | Freq: Every day | ORAL | 11 refills | Status: DC

## 2017-04-28 NOTE — Patient Instructions (Signed)
I had a long discussion with the patient, son and granddaughter regarding the results of her recent MRI scan as well as CT angiogram and discussed silent subcortical infarcts and significant brachiocephalic and subclavian stenosis likely being symptomatic. I recommend further evaluation by checking diagnostic cerebral catheter angiogram and if high-grade stenosis confirmed she may need elective angioplasty stenting. Add Plavix 75 mg to her aspirin 81 mg daily for stroke prevention and maintain strict control of hypertension with blood pressure goal below 130/90 and lipids with LDL cholesterol goal below 70 mg percent. I also advised the patient to get up slowly and avoid certain Johnson movements to avoid falling. We talked about fall prevention precautions. She will return for follow-up in 3 months or call earlier if necessary.  Fall Prevention in the Home Falls can cause injuries and can affect people from all age groups. There are many simple things that you can do to make your home safe and to help prevent falls. What can I do on the outside of my home?  Regularly repair the edges of walkways and driveways and fix any cracks.  Remove high doorway thresholds.  Trim any shrubbery on the main path into your home.  Use bright outdoor lighting.  Clear walkways of debris and clutter, including tools and rocks.  Regularly check that handrails are securely fastened and in good repair. Both sides of any steps should have handrails.  Install guardrails along the edges of any raised decks or porches.  Have leaves, snow, and ice cleared regularly.  Use sand or salt on walkways during winter months.  In the garage, clean up any spills right away, including grease or oil spills. What can I do in the bathroom?  Use night lights.  Install grab bars by the toilet and in the tub and shower. Do not use towel bars as grab bars.  Use non-skid mats or decals on the floor of the tub or shower.  If you  need to sit down while you are in the shower, use a plastic, non-slip stool.  Keep the floor dry. Immediately clean up any water that spills on the floor.  Remove soap buildup in the tub or shower on a regular basis.  Attach bath mats securely with double-sided non-slip rug tape.  Remove throw rugs and other tripping hazards from the floor. What can I do in the bedroom?  Use night lights.  Make sure that a bedside light is easy to reach.  Do not use oversized bedding that drapes onto the floor.  Have a firm chair that has side arms to use for getting dressed.  Remove throw rugs and other tripping hazards from the floor. What can I do in the kitchen?  Clean up any spills right away.  Avoid walking on wet floors.  Place frequently used items in easy-to-reach places.  If you need to reach for something above you, use a sturdy step stool that has a grab bar.  Keep electrical cables out of the way.  Do not use floor polish or wax that makes floors slippery. If you have to use wax, make sure that it is non-skid floor wax.  Remove throw rugs and other tripping hazards from the floor. What can I do in the stairways?  Do not leave any items on the stairs.  Make sure that there are handrails on both sides of the stairs. Fix handrails that are broken or loose. Make sure that handrails are as long as the stairways.  Check  any carpeting to make sure that it is firmly attached to the stairs. Fix any carpet that is loose or worn.  Avoid having throw rugs at the top or bottom of stairways, or secure the rugs with carpet tape to prevent them from moving.  Make sure that you have a light switch at the top of the stairs and the bottom of the stairs. If you do not have them, have them installed. What are some other fall prevention tips?  Wear closed-toe shoes that fit well and support your feet. Wear shoes that have rubber soles or low heels.  When you use a stepladder, make sure that  it is completely opened and that the sides are firmly locked. Have someone hold the ladder while you are using it. Do not climb a closed stepladder.  Add color or contrast paint or tape to grab bars and handrails in your home. Place contrasting color strips on the first and last steps.  Use mobility aids as needed, such as canes, walkers, scooters, and crutches.  Turn on lights if it is dark. Replace any light bulbs that burn out.  Set up furniture so that there are clear paths. Keep the furniture in the same spot.  Fix any uneven floor surfaces.  Choose a carpet design that does not hide the edge of steps of a stairway.  Be aware of any and all pets.  Review your medicines with your healthcare provider. Some medicines can cause dizziness or changes in blood pressure, which increase your risk of falling. Talk with your health care provider about other ways that you can decrease your risk of falls. This may include working with a physical therapist or trainer to improve your strength, balance, and endurance. This information is not intended to replace advice given to you by your health care provider. Make sure you discuss any questions you have with your health care provider. Document Released: 07/10/2002 Document Revised: 12/17/2015 Document Reviewed: 08/24/2014 Elsevier Interactive Patient Education  2017 [employer].

## 2017-04-28 NOTE — Telephone Encounter (Signed)
Called to schedule angio, left message for pt to return call. AW 

## 2017-04-28 NOTE — Progress Notes (Signed)
[facility] [address] [place]. [place] [address] [phone]       OFFICE FOLLOW UP VISIT NOTE  Tracey Hartman Date of Birth:  [birth year] Medical Record Number:  [id number]   Referring MD:   self  Reason for Referral:  Memory and balance difficulties  HPI: Initial consult 03/01/17 : Tracey Hartman is a pleasant 83 year old Caucasian lady who is accompanied today by her son and granddaughter. She has been having some memory and cognitive difficulties for several months. She attributes this due to significant stress that she is going through. She is in the process of selling her house and is currently building a house in [place] where she plans to move to be close to her family. 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 She had times feels she has trouble remembering recent information as well as at times finding words and completing sentences. Her son feels that her communication is not as crisp as it used to be and she has trouble reading words out and cannot think and sharply. Patient is still independent and manages all activities of daily living. She denies any significant headaches, head injury with loss of consciousness, seizures. She has a prior history of small stroke in July 2015 when she saw me for episode of gait ataxia and some mild right-sided weakness which lasted several days this event and MRI scan of the brain done on 01/14/2014 which I personally reviewed and shows moderate changes of small vessel disease and several tiny microhemorrhages on gradient echo images. Intracranial and extracranial vascular imaging has not been performed and echo has also not been done. Hemoglobin A1c was borderline at 6.3 and lipid profile showed LDL of 94 mg percent She is on aspirin daily which is tolerating well without side effects. She is also on pravastatin and is tolerating it well without muscle aches or pains. She also takes fish oil  daily. She's had some mild balance difficulties for long time but feels awful 8 when she makes a sudden turn or tries to walk quickly she is insecure. She is a had a couple of minor falls but has not had fortunately any major injuries. She denies any tingling numbness burning or pain in her feet. She denies feeling depressed but does admit to some anxiety due to her move. There is no history of episodes of confusion, disorientation. She still driving and has never gotten lost. There is no history of delusions, hallucinations or agitation or abnormal behavior Update 04/28/2017 ; she returns for follow-up after last visit 2 months ago. She is accompanied by her son and granddaughter. Patient states she is doing well she's had no stroke or TIA symptoms. She continues to have dizziness and imbalance. She is careful if she gets gets up slowly but if she makes sudden movements she is more off-balance. She is at no falls or injuries. She did undergo MRI scan of the brain on 03/11/17 which I personally reviewed shows 2 subacute small white matter infarcts on either side which appear to be lacunar in nature. There are also remote age lacunar infarcts. CT angiogram of the brain and neck was performed on 04/20/17 which shows severely calcified bulky plaque extends stenosis involving the right brachiocephalic and left subclavian arteries. There was a tiny 2 mm posterior right internal carotid artery terminus aneurysm or infundibulum. I personally reviewed the imaging films and discuss with the family. Patient denies any symptoms of vertebrobasilar  steal in the form of dizziness and lightheadedness with arms raised about the shoulder. All memory panel labs done on 03/01/17 were normal. ROS:   14 system review of systems is positive for  memory loss,  , dizziness, imbalance, walking difficulty and all other systems negative  PMH:  Past Medical History:  Diagnosis Date  . Coronary artery disease   . Hypertension   . IBS  (irritable bowel syndrome)   . Osteoporosis   . Recurrent UTI   . Stroke [facility])     Social History:  Social History   Social History  . Marital status: Married    Spouse name: N/A  . Number of children: 4  . Years of education: 12th   Occupational History  . claims  [employer]   Social History Main Topics  . Smoking status: Former Research scientist (life sciences)  . Smokeless tobacco: Never Used  . Alcohol use No     Comment: patient drinks coffee  . Drug use: No  . Sexual activity: No   Other Topics Concern  . Not on file   Social History Narrative   Patient lives at home alone    Patient is right handed   Patient drinks coffee daily    Medications:   Current Outpatient Prescriptions on File Prior to Visit  Medication Sig Dispense Refill  . aspirin EC 81 MG tablet Take 1 tablet (81 mg total) by mouth daily.    . calcium-vitamin D (OSCAL WITH D) 500-200 MG-UNIT tablet Take 1 tablet by mouth daily with breakfast.    . Cranberry 1000 MG CAPS Take 1,000 mg by mouth daily.    Marland Kitchen ibuprofen (ADVIL,MOTRIN) 800 MG tablet Take 1 tablet (800 mg total) by mouth 3 (three) times daily. (Patient taking differently: Take 800 mg by mouth every 6 (six) hours as needed. ) 21 tablet 0  . metoprolol (LOPRESSOR) 50 MG tablet Take 50 tablets by mouth daily. Patient takes half a pill  25mg  daily    . Multiple Vitamins-Minerals (MULTIVITAMIN WITH MINERALS) tablet Take 1 tablet by mouth daily.    . Omega-3 Fatty Acids (FISH OIL) 1000 MG CPDR Take 2,000 mg by mouth daily.     . pravastatin (PRAVACHOL) 20 MG tablet Take 20 mg by mouth.      No current facility-administered medications on file prior to visit.     Allergies:   Allergies  Allergen Reactions  . Codeine     Physical Exam General: well developed, well nourished, seated, in no evident distress Head: head normocephalic and atraumatic.   Neck: supple with no carotid or supraclavicular bruits Cardiovascular: regular rate and rhythm, no  murmurs Musculoskeletal: no deformity Skin:  no rash/petichiae Vascular:  Normal pulses all extremities.Symmetric bilateral radial pulses at rest but Adson's test is positive on the left.  Neurologic Exam Mental Status: Awake and fully alert. Oriented to place and time. Recent and remote memory intact. Attention span, concentration and fund of knowledge appropriate. Mood and affect appropriate. Mini-Mental status exam scored 29/30 with only one deficit. Clock drawing 3/4. Animal naming only 5. Geriatric depression scale 0 not depressed. Cranial Nerves: Fundoscopic exam reveals sharp disc margins. Pupils equal, briskly reactive to light. Extraocular movements full without nystagmus. Visual fields full to confrontation. Hearing intact. Facial sensation intact. Face, tongue, palate moves normally and symmetrically.  Motor: Normal bulk and tone. Normal strength in all tested extremity muscles. Sensory.: intact to touch , pinprick , position and vibratory sensation.  Coordination: Rapid alternating movements normal  in all extremities. Finger-to-nose and heel-to-shin performed accurately bilaterally. Gait and Station: Arises from chair without difficulty. Stance is normal. Gait demonstrates normal stride length and balance . Able to heel, toe and tandem walk with moderate difficulty.  Reflexes: 1+ and symmetric. Toes downgoing.       ASSESSMENT: 77 year Caucasian lady with mild memory and cognitive difficulties likely from mild cognitive impairment. Mild gait and balance difficulties likely multifactorial secondary to age-related small vessel disease and prior strokes . Imaging studies show significant proximal bilateral large vessel stenosis which need further evaluation to consider revascularization    PLAN: I had a long discussion with the patient, son and granddaughter regarding the results of her recent MRI scan as well as CT angiogram and discussed silent subcortical infarcts and significant  brachiocephalic and subclavian stenosis likely being symptomatic. I recommend further evaluation by checking diagnostic cerebral catheter angiogram and if high-grade stenosis confirmed she may need elective angioplasty stenting. Add Plavix 75 mg to her aspirin 81 mg daily for stroke prevention and maintain strict control of hypertension with blood pressure goal below 130/90 and lipids with LDL cholesterol goal below 70 mg percent. I also advised the patient to get up slowly and avoid sudden movements to avoid falling. We talked about fall prevention precautions. She will return for follow-up in 3 months or call earlier if necessary. Greater than 50% time during this 30 minute visit was spent on counseling and coordination of care about her son and strokes, mild cognitive impairment, need to do further diagnostic evaluation and discuss treatment options including angioplasty stenting and answering questions. Antony Contras, MD  [facility] [address] [place], [place] [address]  Phone [phone] Fax [phone] Note: This document was prepared with digital dictation and possible smart phrase technology. Any transcriptional errors that result from this process are unintentional.

## 2017-05-12 ENCOUNTER — Other Ambulatory Visit: Payer: Self-pay | Admitting: Radiology

## 2017-05-12 ENCOUNTER — Other Ambulatory Visit: Payer: Self-pay | Admitting: General Surgery

## 2017-05-13 ENCOUNTER — Ambulatory Visit (HOSPITAL_COMMUNITY)
Admission: RE | Admit: 2017-05-13 | Discharge: 2017-05-13 | Disposition: A | Discharge status: Home / Self Care | Payer: Medicare Other | Source: Ambulatory Visit | Attending: Neurology | Admitting: Neurology

## 2017-05-13 ENCOUNTER — Other Ambulatory Visit: Payer: Self-pay | Admitting: Neurology

## 2017-05-13 DIAGNOSIS — K589 Irritable bowel syndrome without diarrhea: Secondary | ICD-10-CM | POA: Insufficient documentation

## 2017-05-13 DIAGNOSIS — Z87440 Personal history of urinary (tract) infections: Secondary | ICD-10-CM | POA: Diagnosis not present

## 2017-05-13 DIAGNOSIS — Z7902 Long term (current) use of antithrombotics/antiplatelets: Secondary | ICD-10-CM | POA: Insufficient documentation

## 2017-05-13 DIAGNOSIS — I10 Essential (primary) hypertension: Secondary | ICD-10-CM | POA: Diagnosis not present

## 2017-05-13 DIAGNOSIS — Z87891 Personal history of nicotine dependence: Secondary | ICD-10-CM | POA: Insufficient documentation

## 2017-05-13 DIAGNOSIS — Z8673 Personal history of transient ischemic attack (TIA), and cerebral infarction without residual deficits: Secondary | ICD-10-CM | POA: Diagnosis not present

## 2017-05-13 DIAGNOSIS — I6601 Occlusion and stenosis of right middle cerebral artery: Secondary | ICD-10-CM | POA: Diagnosis not present

## 2017-05-13 DIAGNOSIS — Z7982 Long term (current) use of aspirin: Secondary | ICD-10-CM | POA: Diagnosis not present

## 2017-05-13 DIAGNOSIS — G450 Vertebro-basilar artery syndrome: Secondary | ICD-10-CM

## 2017-05-13 DIAGNOSIS — I2510 Atherosclerotic heart disease of native coronary artery without angina pectoris: Secondary | ICD-10-CM | POA: Diagnosis not present

## 2017-05-13 DIAGNOSIS — M810 Age-related osteoporosis without current pathological fracture: Secondary | ICD-10-CM | POA: Diagnosis not present

## 2017-05-13 DIAGNOSIS — I6503 Occlusion and stenosis of bilateral vertebral arteries: Secondary | ICD-10-CM | POA: Diagnosis not present

## 2017-05-13 HISTORY — PX: IR ANGIO INTRA EXTRACRAN SEL COM CAROTID INNOMINATE BILAT MOD SED: IMG5360

## 2017-05-13 HISTORY — PX: IR ANGIO VERTEBRAL SEL VERTEBRAL UNI L MOD SED: IMG5367

## 2017-05-13 HISTORY — PX: IR ANGIO VERTEBRAL SEL SUBCLAVIAN INNOMINATE UNI R MOD SED: IMG5365

## 2017-05-13 LAB — CBC
HCT: 45.5 % (ref 36.0–46.0)
Hemoglobin: 15.3 g/dL — ABNORMAL HIGH (ref 12.0–15.0)
MCH: 34.4 pg — AB (ref 26.0–34.0)
MCHC: 33.6 g/dL (ref 30.0–36.0)
MCV: 102.2 fL — ABNORMAL HIGH (ref 78.0–100.0)
PLATELETS: 135 10*3/uL — AB (ref 150–400)
RBC: 4.45 MIL/uL (ref 3.87–5.11)
RDW: 12.2 % (ref 11.5–15.5)
WBC: 6.9 10*3/uL (ref 4.0–10.5)

## 2017-05-13 LAB — BASIC METABOLIC PANEL
Anion gap: 8 (ref 5–15)
BUN: 9 mg/dL (ref 6–20)
CALCIUM: 9.4 mg/dL (ref 8.9–10.3)
CO2: 26 mmol/L (ref 22–32)
Chloride: 104 mmol/L (ref 101–111)
Creatinine, Ser: 1.07 mg/dL — ABNORMAL HIGH (ref 0.44–1.00)
GFR calc Af Amer: 53 mL/min — ABNORMAL LOW (ref >60)
GFR calc non Af Amer: 46 mL/min — ABNORMAL LOW (ref >60)
GLUCOSE: 102 mg/dL — AB (ref 65–99)
Potassium: 4.3 mmol/L (ref 3.5–5.1)
SODIUM: 138 mmol/L (ref 135–145)

## 2017-05-13 LAB — PROTIME-INR
INR: 0.99
Prothrombin Time: 13 seconds (ref 11.4–15.2)

## 2017-05-13 LAB — APTT: APTT: 32 s (ref 24–36)

## 2017-05-13 MED ORDER — SODIUM CHLORIDE 0.9 % IV SOLN: INTRAVENOUS | Status: AC

## 2017-05-13 MED ORDER — FENTANYL CITRATE (PF) 100 MCG/2ML IJ SOLN
INTRAMUSCULAR | Status: AC | PRN
  Administered 2017-05-13: 25 ug via INTRAVENOUS

## 2017-05-13 MED ORDER — LIDOCAINE HCL (PF) 1 % IJ SOLN
INTRAMUSCULAR | Status: AC | PRN
  Administered 2017-05-13: 10 mL

## 2017-05-13 MED ORDER — IOPAMIDOL (ISOVUE-300) INJECTION 61%
INTRAVENOUS | Status: AC
  Filled 2017-05-13: qty 150

## 2017-05-13 MED ORDER — CLOPIDOGREL BISULFATE 75 MG PO TABS
ORAL_TABLET | ORAL | Status: AC
  Filled 2017-05-13: qty 1

## 2017-05-13 MED ORDER — IOPAMIDOL (ISOVUE-300) INJECTION 61%
INTRAVENOUS | Status: AC
Start: 2017-05-13 — End: 2017-05-13
  Administered 2017-05-13: 50 mL
  Filled 2017-05-13: qty 50

## 2017-05-13 MED ORDER — FENTANYL CITRATE (PF) 100 MCG/2ML IJ SOLN
INTRAMUSCULAR | Status: AC
  Filled 2017-05-13: qty 2

## 2017-05-13 MED ORDER — SODIUM CHLORIDE 0.9 % IV SOLN
Freq: Once | INTRAVENOUS | Status: AC
  Administered 2017-05-13: 10:00:00 via INTRAVENOUS

## 2017-05-13 MED ORDER — LIDOCAINE HCL 1 % IJ SOLN
INTRAMUSCULAR | Status: AC
  Filled 2017-05-13: qty 20

## 2017-05-13 MED ORDER — ASPIRIN 81 MG PO CHEW
CHEWABLE_TABLET | ORAL | Status: AC
  Filled 2017-05-13: qty 1

## 2017-05-13 MED ORDER — CLOPIDOGREL BISULFATE 75 MG PO TABS
ORAL_TABLET | ORAL | Status: AC | PRN
  Administered 2017-05-13: 75 mg via ORAL

## 2017-05-13 MED ORDER — HEPARIN SODIUM (PORCINE) 1000 UNIT/ML IJ SOLN
INTRAMUSCULAR | Status: AC
  Filled 2017-05-13: qty 2

## 2017-05-13 MED ORDER — HEPARIN SODIUM (PORCINE) 1000 UNIT/ML IJ SOLN
INTRAMUSCULAR | Status: AC | PRN
  Administered 2017-05-13: 1000 [IU] via INTRAVENOUS

## 2017-05-13 MED ORDER — MIDAZOLAM HCL 2 MG/2ML IJ SOLN
INTRAMUSCULAR | Status: AC
  Filled 2017-05-13: qty 2

## 2017-05-13 MED ORDER — MIDAZOLAM HCL 2 MG/2ML IJ SOLN
INTRAMUSCULAR | Status: AC | PRN
  Administered 2017-05-13: 1 mg via INTRAVENOUS

## 2017-05-13 MED ORDER — IOPAMIDOL (ISOVUE-300) INJECTION 61%
INTRAVENOUS | Status: AC
  Administered 2017-05-13: 50 mL
  Filled 2017-05-13: qty 150

## 2017-05-13 MED ORDER — ASPIRIN 81 MG PO CHEW
CHEWABLE_TABLET | ORAL | Status: AC | PRN
  Administered 2017-05-13: 81 mg via ORAL

## 2017-05-13 NOTE — Procedures (Signed)
S/P 4 vessel cerebral arteriogram  RT CFA approach. Findings/ 1.Approx 85 % stenosis of Lt SCA prox . 2.appro 75 to 80 % stenosis of RT MCA sup division

## 2017-05-13 NOTE — Discharge Instructions (Addendum)

## 2017-05-13 NOTE — Sedation Documentation (Addendum)
Right groin sheath removed. V-Pad applied 

## 2017-05-13 NOTE — H&P (Signed)
Chief Complaint: Mini strokes  Referring Physician(s): Sethi,Pramod S  Supervising Physician: Luanne Bras  Patient Status: Tracey Hartman - Out-pt  History of Present Illness: Tracey Hartman is a 83 y.o. female who has been having some memory and cognitive difficulties for several months.   She had times feels she has trouble remembering recent information as well as at times finding words and completing sentences.   Her son feels that her communication is not as crisp as it used to be and she has trouble reading words out and cannot think and sharply.  She has a prior history of small stroke in July 2015 when she Dr. Leonie Man for episode of gait ataxia and some mild right-sided weakness which lasted several days.  MRI scan of the brain done on 01/14/2014 showed moderate changes of small vessel disease and several tiny microhemorrhages on gradient echo images.  CT angiogram of the brain and neck was performed on 04/20/17 which shows severely calcified bulky plaque extends stenosis involving the right brachiocephalic and left subclavian arteries.   There was a tiny 2 mm posterior right internal carotid artery terminus aneurysm or infundibulum.   She denies any symptoms of vertebrobasilar steal in the form of dizziness and lightheadedness with arms raised about the shoulder.   She is here today for carotid/cerebral angiography.  She is NPO. She taking Plavix.   Past Medical History:  Diagnosis Date  . Coronary artery disease   . Hypertension   . IBS (irritable bowel syndrome)   . Osteoporosis   . Recurrent UTI   . Stroke [facility])     Past Surgical History:  Procedure Laterality Date  . APPENDECTOMY    . CATARACT EXTRACTION Bilateral   . COLONOSCOPY      Allergies: Codeine  Medications: Prior to Admission medications   Medication Sig Start Date End Date Taking? Authorizing Provider  aspirin EC 81 MG tablet Take 1 tablet (81 mg total) by mouth daily. 06/03/14  Yes Philmore Pali, NP  Biotin 10 MG TABS Take 1 tablet by mouth daily.   Yes [provider]  Calcium Carbonate-Vitamin D 600-400 MG-UNIT tablet Take 2 tablets by mouth daily with breakfast.    Yes [provider]  clopidogrel (PLAVIX) 75 MG tablet Take 1 tablet (75 mg total) by mouth daily. 04/28/17  Yes Garvin Fila, MD  Cranberry 1000 MG CAPS Take 1,000 mg by mouth daily.   Yes [provider]  GARLIC PO Take 1 capsule by mouth daily.   Yes [provider]  metoprolol (LOPRESSOR) 50 MG tablet Take 25 tablets by mouth daily. Patient takes half a pill  25mg  daily 02/13/14  Yes [provider]  Multiple Vitamins-Minerals (MULTIVITAMIN WITH MINERALS) tablet Take 1 tablet by mouth daily.   Yes [provider]  Omega-3 Fatty Acids (FISH OIL) 1200 MG CAPS Take 2 capsules by mouth daily.    Yes [provider]  RESVERATROL PO Take 1 capsule by mouth daily. 200 mg   Yes [provider]  trimethoprim (TRIMPEX) 100 MG tablet Take 100 mg by mouth daily.    Yes [provider]     Family History  Problem Relation Age of Onset  . Kidney failure Mother        renal failure  . Heart attack Father     Social History   Social History  . Marital status: Married    Spouse name: N/A  . Number of children: 4  .  Years of education: 12th   Occupational History  . claims  [employer]   Social History Main Topics  . Smoking status: Former Research scientist (life sciences)  . Smokeless tobacco: Never Used  . Alcohol use No     Comment: patient drinks coffee  . Drug use: No  . Sexual activity: No   Other Topics Concern  . Not on file   Social History Narrative   Patient lives at home alone    Patient is right handed   Patient drinks coffee daily    Review of Systems: A 12 point ROS discussed  Review of Systems  Constitutional: Negative.   HENT: Negative.   Respiratory: Negative.   Cardiovascular: Negative.   Gastrointestinal: Negative.     Genitourinary: Negative.   Musculoskeletal: Negative.   Skin: Negative.   Hematological: Negative.   Psychiatric/Behavioral: Negative.     Vital Signs: BP (!) 166/84   Pulse 73   Temp 97.6 F (36.4 C) (Oral)   Ht 5\' 2"  (1.575 m)   Wt 137 lb (62.1 kg)   SpO2 (!) 73%   BMI 25.06 kg/m   Physical Exam  Constitutional: She is oriented to person, place, and time. She appears well-developed.  HENT:  Head: Normocephalic and atraumatic.  Eyes: EOM are normal.  Neck: Normal range of motion.  Cardiovascular: Normal rate, regular rhythm and normal heart sounds.   No murmur heard. Pulmonary/Chest: Effort normal and breath sounds normal. No respiratory distress. She has no wheezes.  Abdominal: Soft. She exhibits no distension. There is no tenderness.  Musculoskeletal: Normal range of motion.  Neurological: She is alert and oriented to person, place, and time.  Skin: Skin is warm and dry.  Psychiatric: She has a normal mood and affect. Her behavior is normal. Judgment and thought content normal.  Vitals reviewed.   Imaging: Ct Angio Head W Or Wo Contrast  Result Date: 04/20/2017 CLINICAL DATA:  83 year old female with prior lacunar type cerebral infarcts, most recently in August. Possible amyloid angiopathy. Creatinine was obtained on site at [place] at [address] Results: Creatinine 1.0 mg/dL. EXAM: CT ANGIOGRAPHY HEAD AND NECK TECHNIQUE: Multidetector CT imaging of the head and neck was performed using the standard protocol during bolus administration of intravenous contrast. Multiplanar CT image reconstructions and MIPs were obtained to evaluate the vascular anatomy. Carotid stenosis measurements (when applicable) are obtained utilizing NASCET criteria, using the distal internal carotid diameter as the denominator. CONTRAST:  75 mL Isovue 370 COMPARISON:  Brain MRI 03/11/2017 and earlier. CT head face and cervical spine without contrast 12/11/2009. FINDINGS: CT HEAD  FINDINGS Brain: Confluent bilateral cerebral white matter hypodensity with progression since 2011. Superimposed mild generalized cerebral volume loss since that time. Chronic mid right cerebellar hemisphere lacunar infarct is new since 2011. No ventriculomegaly. No acute intracranial hemorrhage identified. No midline shift, mass effect, or evidence of intracranial mass lesion. No cortically based acute infarct identified. Calvarium and skull base: Stable hyperostosis of the calvarium, normal variant. Paranasal sinuses: Clear. Orbits: No acute orbit or scalp soft tissue findings. CTA NECK Skeleton: Absent dentition. Degenerative changes with mild spondylolisthesis in the cervical spine. No acute osseous abnormality identified. Upper chest: Negative lung apices. No superior mediastinal lymphadenopathy. Other neck: Negative.  No cervical lymphadenopathy. Aortic arch: Left-side upper extremity intravenous contrast injection. Prominent venous reflux of contrast into the left paravertebral veins and external jugular vein. The left-side central veins and visible SVC however do remain patent. Three vessel arch configuration with moderate calcified  atherosclerosis. Right carotid system: Bulky calcified plaque at the brachiocephalic artery origin with high-grade stenosis (series 10, image 1045). Distal to this calcified plaque the brachiocephalic is widely patent. No right CCA origin stenosis. Tortuous proximal right CCA. Comparatively mild soft and calcified plaque at the right carotid bifurcation continuing into the right ICA origin and bulb. No cervical right ICA stenosis. Left carotid system: Calcified plaque at the left CCA origin does not appear hemodynamically significant. Mildly tortuous left CCA. Mild soft and calcified plaque at the left ICA origin and bulb. No cervical left ICA stenosis. Vertebral arteries: Calcified plaque at the distal right brachiocephalic and right subclavian artery origin does not appear  hemodynamically significant. Calcified plaque at the right vertebral artery origin with mild stenosis. Tortuous but otherwise negative right vertebral artery to the skullbase. Calcified and soft plaque in the proximal left subclavian artery with high-grade stenosis best seen on series 12, image 115 (and series 10, image 968). The left subclavian remains patent. There is only mild calcified plaque near the left vertebral artery origin, no stenosis. Some of the proximal left vertebral artery is mildly obscured by paravertebral venous contrast, but appears normal. The vertebral arteries are codominant in the neck. No left vertebral artery stenosis to the skullbase. CTA HEAD Posterior circulation: Ectatic appearing distal vertebral arteries with mild plaque but no stenosis. Patent bilateral PICA origins. Patent vertebrobasilar junction. Patent AICA and SCA origins with no basilar stenosis. Mild basilar ectasia. Fetal type right PCA origin. Normal PCA origins. Left Posterior communicating artery is diminutive or absent. Normal PCA branches. Anterior circulation: Both ICA siphons are patent and mildly ectatic. Left siphon calcified plaque without stenosis. Small infundibulum at the left ophthalmic artery origin. Right siphon mild calcified plaque without stenosis. Normal right ophthalmic and right posterior communicating artery origins. At the right ICA terminus directed posteriorly there is a tiny 2 mm aneurysm or infundibulum (series 10, image 314 and series 12 image 64). Otherwise normal carotid termini, MCA and ACA origins. Anterior communicating artery is normal. Bilateral ACA branches are normal aside from mild ectasia. Left MCA M1 segment, bifurcation, and left MCA branches are normal. Right MCA M1 segment, bifurcation, and right MCA branches are normal. Venous sinuses: Patent. Anatomic variants: Fetal type right PCA origin. Delayed phase: No abnormal enhancement identified. Review of the MIP images confirms the  above findings IMPRESSION: 1. Calcified Aortic Atherosclerosis (ICD10-I70.0) with bulky calcified plaque resulting in High-grade proximal great vessel stenoses of both the Brachiocephalic and Left Subclavian Arteries. 2. But mild for age atherosclerosis elsewhere. No hemodynamically significant carotid or vertebral artery stenosis. 3. Mild generalized intracranial artery ectasia with no intracranial stenosis or branch occlusion. 4. 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 5. No acute intracranial abnormality. Progressed chronic small vessel disease in the cerebral white matter and right cerebellum since the prior 2011 head CT. 6. No acute findings in the neck or upper chest. Electronically Signed   By: Genevie Ann M.D.   On: 04/20/2017 15:17   Ct Angio Neck W Or Wo Contrast  Result Date: 04/20/2017 CLINICAL DATA:  83 year old female with prior lacunar type cerebral infarcts, most recently in August. Possible amyloid angiopathy. Creatinine was obtained on site at [place] at [address] Results: Creatinine 1.0 mg/dL. EXAM: CT ANGIOGRAPHY HEAD AND NECK TECHNIQUE: Multidetector CT imaging of the head and neck was performed using the standard protocol during bolus administration of intravenous contrast. Multiplanar CT image reconstructions and MIPs were obtained to  evaluate the vascular anatomy. Carotid stenosis measurements (when applicable) are obtained utilizing NASCET criteria, using the distal internal carotid diameter as the denominator. CONTRAST:  75 mL Isovue 370 COMPARISON:  Brain MRI 03/11/2017 and earlier. CT head face and cervical spine without contrast 12/11/2009. FINDINGS: CT HEAD FINDINGS Brain: Confluent bilateral cerebral white matter hypodensity with progression since 2011. Superimposed mild generalized cerebral volume loss since that time. Chronic mid right cerebellar hemisphere lacunar infarct is new since 2011. No ventriculomegaly. No acute intracranial  hemorrhage identified. No midline shift, mass effect, or evidence of intracranial mass lesion. No cortically based acute infarct identified. Calvarium and skull base: Stable hyperostosis of the calvarium, normal variant. Paranasal sinuses: Clear. Orbits: No acute orbit or scalp soft tissue findings. CTA NECK Skeleton: Absent dentition. Degenerative changes with mild spondylolisthesis in the cervical spine. No acute osseous abnormality identified. Upper chest: Negative lung apices. No superior mediastinal lymphadenopathy. Other neck: Negative.  No cervical lymphadenopathy. Aortic arch: Left-side upper extremity intravenous contrast injection. Prominent venous reflux of contrast into the left paravertebral veins and external jugular vein. The left-side central veins and visible SVC however do remain patent. Three vessel arch configuration with moderate calcified atherosclerosis. Right carotid system: Bulky calcified plaque at the brachiocephalic artery origin with high-grade stenosis (series 10, image 1045). Distal to this calcified plaque the brachiocephalic is widely patent. No right CCA origin stenosis. Tortuous proximal right CCA. Comparatively mild soft and calcified plaque at the right carotid bifurcation continuing into the right ICA origin and bulb. No cervical right ICA stenosis. Left carotid system: Calcified plaque at the left CCA origin does not appear hemodynamically significant. Mildly tortuous left CCA. Mild soft and calcified plaque at the left ICA origin and bulb. No cervical left ICA stenosis. Vertebral arteries: Calcified plaque at the distal right brachiocephalic and right subclavian artery origin does not appear hemodynamically significant. Calcified plaque at the right vertebral artery origin with mild stenosis. Tortuous but otherwise negative right vertebral artery to the skullbase. Calcified and soft plaque in the proximal left subclavian artery with high-grade stenosis best seen on series 12,  image 115 (and series 10, image 968). The left subclavian remains patent. There is only mild calcified plaque near the left vertebral artery origin, no stenosis. Some of the proximal left vertebral artery is mildly obscured by paravertebral venous contrast, but appears normal. The vertebral arteries are codominant in the neck. No left vertebral artery stenosis to the skullbase. CTA HEAD Posterior circulation: Ectatic appearing distal vertebral arteries with mild plaque but no stenosis. Patent bilateral PICA origins. Patent vertebrobasilar junction. Patent AICA and SCA origins with no basilar stenosis. Mild basilar ectasia. Fetal type right PCA origin. Normal PCA origins. Left Posterior communicating artery is diminutive or absent. Normal PCA branches. Anterior circulation: Both ICA siphons are patent and mildly ectatic. Left siphon calcified plaque without stenosis. Small infundibulum at the left ophthalmic artery origin. Right siphon mild calcified plaque without stenosis. Normal right ophthalmic and right posterior communicating artery origins. At the right ICA terminus directed posteriorly there is a tiny 2 mm aneurysm or infundibulum (series 10, image 314 and series 12 image 64). Otherwise normal carotid termini, MCA and ACA origins. Anterior communicating artery is normal. Bilateral ACA branches are normal aside from mild ectasia. Left MCA M1 segment, bifurcation, and left MCA branches are normal. Right MCA M1 segment, bifurcation, and right MCA branches are normal. Venous sinuses: Patent. Anatomic variants: Fetal type right PCA origin. Delayed phase: No abnormal enhancement identified. Review of the  MIP images confirms the above findings IMPRESSION: 1. Calcified Aortic Atherosclerosis (ICD10-I70.0) with bulky calcified plaque resulting in High-grade proximal great vessel stenoses of both the Brachiocephalic and Left Subclavian Arteries. 2. But mild for age atherosclerosis elsewhere. No hemodynamically  significant carotid or vertebral artery stenosis. 3. Mild generalized intracranial artery ectasia with no intracranial stenosis or branch occlusion. 4. 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 5. No acute intracranial abnormality. Progressed chronic small vessel disease in the cerebral white matter and right cerebellum since the prior 2011 head CT. 6. No acute findings in the neck or upper chest. Electronically Signed   By: Genevie Ann M.D.   On: 04/20/2017 15:17    Labs:  CBC:  Recent Labs  05/13/17 1003  WBC 6.9  HGB 15.3*  HCT 45.5  PLT 135*    COAGS: No results for input(s): INR, APTT in the last 8760 hours.  BMP: No results for input(s): NA, K, CL, CO2, GLUCOSE, BUN, CALCIUM, CREATININE, GFRNONAA, GFRAA in the last 8760 hours.  Invalid input(s): CMP  LIVER FUNCTION TESTS: No results for input(s): BILITOT, AST, ALT, ALKPHOS, PROT, ALBUMIN in the last 8760 hours.  TUMOR MARKERS: No results for input(s): AFPTM, CEA, CA199, CHROMGRNA in the last 8760 hours.  Assessment and Plan:  Dizziness and imbalance.  CT angiogram of the brain and neck showing severely calcified bulky plaque extends stenosis involving the right brachiocephalic and left subclavian arteries.  Will proceed with Thoracic, carotid, and cerebral angiography today by Dr. Estanislado Pandy.  Risks and benefits of angiography were discussed with the patient including, but not limited to bleeding, infection, vascular injury or contrast induced renal failure.  This interventional procedure involves the use of X-rays and because of the nature of the planned procedure, it is possible that we will have prolonged use of X-ray fluoroscopy.  Potential radiation risks to you include (but are not limited to) the following: - A slightly elevated risk for cancer  several years later in life. This risk is typically less than 0.5% percent. This risk is low in comparison to the normal incidence of human cancer,  which is 33% for women and 50% for men according to the [place]. - Radiation induced injury can include skin redness, resembling a rash, tissue breakdown / ulcers and hair loss (which can be temporary or permanent).   The likelihood of either of these occurring depends on the difficulty of the procedure and whether you are sensitive to radiation due to previous procedures, disease, or genetic conditions.   IF your procedure requires a prolonged use of radiation, you will be notified and given written instructions for further action.  It is your responsibility to monitor the irradiated area for the 2 weeks following the procedure and to notify your physician if you are concerned that you have suffered a radiation induced injury.    All of the patient's questions were answered, patient is agreeable to proceed.  Consent signed and in chart.  Thank you for this interesting consult.  I greatly enjoyed meeting Tracey Hartman and look forward to participating in their care.  A copy of this report was sent to the requesting provider on this date.  Electronically Signed: Murrell Redden, PA-C 05/13/2017, 11:14 AM   I spent a total of  30 Minutes  in face to face in clinical consultation, greater than 50% of which was counseling/coordinating care for thoracic, carotid, cerebral angiography.

## 2017-05-17 ENCOUNTER — Telehealth: Payer: Self-pay

## 2017-05-17 NOTE — Telephone Encounter (Signed)
-----   Message from Garvin Fila, MD sent at 05/14/2017 10:33 AM EDT ----- Tracey Hartman inform patient that basic metabolic panel and clotting labwork was normal

## 2017-05-17 NOTE — Telephone Encounter (Signed)
I called and left a detailed message with normal lab results, ok per dpr.

## 2017-05-18 ENCOUNTER — Encounter (HOSPITAL_COMMUNITY): Payer: Self-pay | Admitting: Interventional Radiology

## 2017-05-20 ENCOUNTER — Other Ambulatory Visit (HOSPITAL_COMMUNITY): Payer: Self-pay | Admitting: Interventional Radiology

## 2017-05-20 DIAGNOSIS — I771 Stricture of artery: Secondary | ICD-10-CM

## 2017-06-04 ENCOUNTER — Emergency Department (HOSPITAL_COMMUNITY): Payer: Medicare Other

## 2017-06-04 ENCOUNTER — Other Ambulatory Visit: Payer: Self-pay

## 2017-06-04 ENCOUNTER — Encounter (HOSPITAL_COMMUNITY): Payer: Self-pay

## 2017-06-04 ENCOUNTER — Observation Stay (HOSPITAL_COMMUNITY)
Admission: EM | Admit: 2017-06-04 | Discharge: 2017-06-05 | Disposition: A | Discharge status: Home / Self Care | Payer: Medicare Other | Attending: Family Medicine | Admitting: Family Medicine

## 2017-06-04 DIAGNOSIS — Z87891 Personal history of nicotine dependence: Secondary | ICD-10-CM | POA: Insufficient documentation

## 2017-06-04 DIAGNOSIS — S01312A Laceration without foreign body of left ear, initial encounter: Secondary | ICD-10-CM | POA: Diagnosis not present

## 2017-06-04 DIAGNOSIS — R55 Syncope and collapse: Principal | ICD-10-CM | POA: Insufficient documentation

## 2017-06-04 DIAGNOSIS — Z8673 Personal history of transient ischemic attack (TIA), and cerebral infarction without residual deficits: Secondary | ICD-10-CM | POA: Diagnosis not present

## 2017-06-04 DIAGNOSIS — Z7982 Long term (current) use of aspirin: Secondary | ICD-10-CM | POA: Diagnosis not present

## 2017-06-04 DIAGNOSIS — S59902A Unspecified injury of left elbow, initial encounter: Secondary | ICD-10-CM | POA: Diagnosis not present

## 2017-06-04 DIAGNOSIS — Y998 Other external cause status: Secondary | ICD-10-CM | POA: Insufficient documentation

## 2017-06-04 DIAGNOSIS — Z7902 Long term (current) use of antithrombotics/antiplatelets: Secondary | ICD-10-CM | POA: Insufficient documentation

## 2017-06-04 DIAGNOSIS — W19XXXA Unspecified fall, initial encounter: Secondary | ICD-10-CM | POA: Diagnosis not present

## 2017-06-04 DIAGNOSIS — M25522 Pain in left elbow: Secondary | ICD-10-CM | POA: Diagnosis not present

## 2017-06-04 DIAGNOSIS — S0990XA Unspecified injury of head, initial encounter: Secondary | ICD-10-CM | POA: Diagnosis not present

## 2017-06-04 DIAGNOSIS — R2689 Other abnormalities of gait and mobility: Secondary | ICD-10-CM | POA: Diagnosis not present

## 2017-06-04 DIAGNOSIS — I2510 Atherosclerotic heart disease of native coronary artery without angina pectoris: Secondary | ICD-10-CM | POA: Diagnosis not present

## 2017-06-04 DIAGNOSIS — M7989 Other specified soft tissue disorders: Secondary | ICD-10-CM | POA: Diagnosis not present

## 2017-06-04 DIAGNOSIS — Y92010 Kitchen of single-family (private) house as the place of occurrence of the external cause: Secondary | ICD-10-CM | POA: Insufficient documentation

## 2017-06-04 DIAGNOSIS — Z23 Encounter for immunization: Secondary | ICD-10-CM | POA: Diagnosis not present

## 2017-06-04 DIAGNOSIS — Z79899 Other long term (current) drug therapy: Secondary | ICD-10-CM | POA: Insufficient documentation

## 2017-06-04 DIAGNOSIS — I10 Essential (primary) hypertension: Secondary | ICD-10-CM | POA: Insufficient documentation

## 2017-06-04 DIAGNOSIS — Y939 Activity, unspecified: Secondary | ICD-10-CM | POA: Insufficient documentation

## 2017-06-04 DIAGNOSIS — I6330 Cerebral infarction due to thrombosis of unspecified cerebral artery: Secondary | ICD-10-CM

## 2017-06-04 DIAGNOSIS — S01319A Laceration without foreign body of unspecified ear, initial encounter: Secondary | ICD-10-CM

## 2017-06-04 LAB — I-STAT CHEM 8, ED
BUN: 16 mg/dL (ref 6–20)
CHLORIDE: 104 mmol/L (ref 101–111)
CREATININE: 0.9 mg/dL (ref 0.44–1.00)
Calcium, Ion: 1.28 mmol/L (ref 1.15–1.40)
GLUCOSE: 198 mg/dL — AB (ref 65–99)
HEMATOCRIT: 48 % — AB (ref 36.0–46.0)
Hemoglobin: 16.3 g/dL — ABNORMAL HIGH (ref 12.0–15.0)
POTASSIUM: 4 mmol/L (ref 3.5–5.1)
Sodium: 140 mmol/L (ref 135–145)
TCO2: 26 mmol/L (ref 22–32)

## 2017-06-04 LAB — CBC
HEMATOCRIT: 46.2 % — AB (ref 36.0–46.0)
HEMOGLOBIN: 15.3 g/dL — AB (ref 12.0–15.0)
MCH: 34.1 pg — AB (ref 26.0–34.0)
MCHC: 33.1 g/dL (ref 30.0–36.0)
MCV: 102.9 fL — ABNORMAL HIGH (ref 78.0–100.0)
Platelets: 157 10*3/uL (ref 150–400)
RBC: 4.49 MIL/uL (ref 3.87–5.11)
RDW: 12.2 % (ref 11.5–15.5)
WBC: 10.1 10*3/uL (ref 4.0–10.5)

## 2017-06-04 LAB — I-STAT TROPONIN, ED: Troponin i, poc: 0.01 ng/mL (ref 0.00–0.08)

## 2017-06-04 LAB — DIFFERENTIAL
BASOS ABS: 0 10*3/uL (ref 0.0–0.1)
Basophils Relative: 0 %
EOS ABS: 0.1 10*3/uL (ref 0.0–0.7)
Eosinophils Relative: 1 %
LYMPHS ABS: 1 10*3/uL (ref 0.7–4.0)
Lymphocytes Relative: 10 %
MONOS PCT: 5 %
Monocytes Absolute: 0.5 10*3/uL (ref 0.1–1.0)
NEUTROS ABS: 8.5 10*3/uL — AB (ref 1.7–7.7)
Neutrophils Relative %: 84 %

## 2017-06-04 LAB — COMPREHENSIVE METABOLIC PANEL
ALBUMIN: 3.9 g/dL (ref 3.5–5.0)
ALT: 43 U/L (ref 14–54)
AST: 37 U/L (ref 15–41)
Alkaline Phosphatase: 52 U/L (ref 38–126)
Anion gap: 9 (ref 5–15)
BUN: 14 mg/dL (ref 6–20)
CHLORIDE: 104 mmol/L (ref 101–111)
CO2: 24 mmol/L (ref 22–32)
CREATININE: 1.02 mg/dL — AB (ref 0.44–1.00)
Calcium: 9.9 mg/dL (ref 8.9–10.3)
GFR calc Af Amer: 56 mL/min — ABNORMAL LOW (ref >60)
GFR calc non Af Amer: 49 mL/min — ABNORMAL LOW (ref >60)
GLUCOSE: 193 mg/dL — AB (ref 65–99)
POTASSIUM: 4.1 mmol/L (ref 3.5–5.1)
Sodium: 137 mmol/L (ref 135–145)
Total Bilirubin: 0.5 mg/dL (ref 0.3–1.2)
Total Protein: 7.5 g/dL (ref 6.5–8.1)

## 2017-06-04 LAB — PROTIME-INR
INR: 0.93
Prothrombin Time: 12.4 seconds (ref 11.4–15.2)

## 2017-06-04 LAB — APTT: aPTT: 30 seconds (ref 24–36)

## 2017-06-04 MED ORDER — ASPIRIN EC 81 MG PO TBEC
81.0000 mg | DELAYED_RELEASE_TABLET | Freq: Every day | ORAL | Status: DC
  Administered 2017-06-05: 81 mg via ORAL
  Filled 2017-06-04: qty 1

## 2017-06-04 MED ORDER — RESVERATROL 100 MG PO CAPS: 200.0000 mg | ORAL_CAPSULE | Freq: Every day | ORAL | Status: DC

## 2017-06-04 MED ORDER — TRIMETHOPRIM 100 MG PO TABS
100.0000 mg | ORAL_TABLET | Freq: Every day | ORAL | Status: DC
  Administered 2017-06-05: 100 mg via ORAL
  Filled 2017-06-04: qty 1

## 2017-06-04 MED ORDER — METOPROLOL TARTRATE 25 MG PO TABS: 25.0000 mg | ORAL_TABLET | Freq: Every day | ORAL | Status: DC

## 2017-06-04 MED ORDER — ONDANSETRON HCL 4 MG PO TABS: 4.0000 mg | ORAL_TABLET | Freq: Four times a day (QID) | ORAL | Status: DC | PRN

## 2017-06-04 MED ORDER — ADULT MULTIVITAMIN W/MINERALS CH
1.0000 | ORAL_TABLET | Freq: Every day | ORAL | Status: DC
  Administered 2017-06-05: 1 via ORAL
  Filled 2017-06-04: qty 1

## 2017-06-04 MED ORDER — OMEGA-3-ACID ETHYL ESTERS 1 G PO CAPS
1.0000 g | ORAL_CAPSULE | Freq: Every day | ORAL | Status: DC
  Administered 2017-06-05: 1 g via ORAL
  Filled 2017-06-04: qty 1

## 2017-06-04 MED ORDER — LIDOCAINE HCL 2 % IJ SOLN
10.0000 mL | Freq: Once | INTRAMUSCULAR | Status: AC
  Administered 2017-06-04: 200 mg
  Filled 2017-06-04: qty 20

## 2017-06-04 MED ORDER — CLOPIDOGREL BISULFATE 75 MG PO TABS
75.0000 mg | ORAL_TABLET | Freq: Every day | ORAL | Status: DC
  Administered 2017-06-05: 75 mg via ORAL
  Filled 2017-06-04: qty 1

## 2017-06-04 MED ORDER — ACETAMINOPHEN 650 MG RE SUPP: 650.0000 mg | Freq: Four times a day (QID) | RECTAL | Status: DC | PRN

## 2017-06-04 MED ORDER — ONDANSETRON HCL 4 MG/2ML IJ SOLN: 4.0000 mg | Freq: Four times a day (QID) | INTRAMUSCULAR | Status: DC | PRN

## 2017-06-04 MED ORDER — ACETAMINOPHEN 325 MG PO TABS
650.0000 mg | ORAL_TABLET | Freq: Four times a day (QID) | ORAL | Status: DC | PRN
  Administered 2017-06-04: 650 mg via ORAL
  Filled 2017-06-04: qty 2

## 2017-06-04 MED ORDER — ENOXAPARIN SODIUM 40 MG/0.4ML ~~LOC~~ SOLN
40.0000 mg | SUBCUTANEOUS | Status: DC
  Administered 2017-06-05: 40 mg via SUBCUTANEOUS
  Filled 2017-06-04: qty 0.4

## 2017-06-04 MED ORDER — SODIUM CHLORIDE 0.9% FLUSH: 3.0000 mL | Freq: Two times a day (BID) | INTRAVENOUS | Status: DC

## 2017-06-04 MED ORDER — TETANUS-DIPHTH-ACELL PERTUSSIS 5-2.5-18.5 LF-MCG/0.5 IM SUSP
0.5000 mL | Freq: Once | INTRAMUSCULAR | Status: AC
  Administered 2017-06-04: 0.5 mL via INTRAMUSCULAR
  Filled 2017-06-04: qty 0.5

## 2017-06-04 MED ORDER — CALCIUM CARBONATE-VITAMIN D 500-200 MG-UNIT PO TABS
2.0000 | ORAL_TABLET | Freq: Every day | ORAL | Status: DC
  Administered 2017-06-05: 09:00:00 2 via ORAL
  Filled 2017-06-04: qty 2

## 2017-06-04 NOTE — H&P (Signed)
History and Physical    Tracey Hartman MPN:[id number] DOB: [birth year] DOA: 06/04/2017  PCP: Mayra Neer, MD  Patient coming from: Home  I have personally briefly reviewed patient's old medical records in [place]  Chief Complaint: Fall  HPI: Tracey Hartman is a 83 y.o. 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  Seeing Dr. Leonie Man for this, and was actually scheduled to have appointment on Tuesday this upcoming week with Dr. Estanislado Pandy to discuss proceeding with stenting.  Patient presents after what sounds like a syncopal event earlier today.  Patient states that she woke up this morning and had double vision which she describes as 2 images stacked on top of each other and made her feel slightly dizzy.  She sat down at the kitchen table and the next thing she remembers is waking up at the kitchen table bleeding from her left ear and a knot on her head.  Got up, washed head lac, called family members.  No recall of event itself.  Does take plavix, ASA, no new meds.  Metoprolol held this morning due to concern for this worsening symptoms with known stenosis.   ED Course: CT head neg.  Head lac under bandage.  Neuro wants MRI and EEG.   Review of Systems: As per HPI otherwise 10 point review of systems negative.   Past Medical History:  Diagnosis Date  . Coronary artery disease   . Hypertension   . IBS (irritable bowel syndrome)   . Osteoporosis   . Recurrent UTI   . Stroke [facility])     Past Surgical History:  Procedure Laterality Date  . APPENDECTOMY    . CATARACT EXTRACTION Bilateral   . COLONOSCOPY    . IR ANGIO INTRA EXTRACRAN SEL COM CAROTID INNOMINATE BILAT MOD SED  05/13/2017  . IR ANGIO VERTEBRAL SEL SUBCLAVIAN INNOMINATE UNI R MOD SED  05/13/2017  . IR ANGIO VERTEBRAL SEL VERTEBRAL UNI L MOD SED  05/13/2017     reports that she has quit smoking. She has never used smokeless tobacco.  She reports that she does not drink alcohol or use drugs.  Allergies  Allergen Reactions  . Codeine     Family History  Problem Relation Age of Onset  . Kidney failure Mother        renal failure  . Heart attack Father      Prior to Admission medications   Medication Sig Start Date End Date Taking? Authorizing Provider  aspirin EC 81 MG tablet Take 1 tablet (81 mg total) by mouth daily. 06/03/14  Yes Philmore Pali, NP  Biotin 10 MG TABS Take 1 tablet by mouth daily.   Yes [provider]  Calcium Carbonate-Vitamin D 600-400 MG-UNIT tablet Take 2 tablets by mouth daily with breakfast.    Yes [provider]  clopidogrel (PLAVIX) 75 MG tablet Take 1 tablet (75 mg total) by mouth daily. 04/28/17  Yes Garvin Fila, MD  Cranberry 1000 MG CAPS Take 1,000 mg by mouth daily.   Yes [provider]  GARLIC PO Take 1 capsule by mouth daily.   Yes [provider]  metoprolol (LOPRESSOR) 50 MG tablet Take 25 mg by mouth daily. Patient takes half a pill  25mg  daily 02/13/14  Yes [provider]  Multiple Vitamins-Minerals (MULTIVITAMIN WITH MINERALS) tablet Take 1 tablet by mouth daily.   Yes [provider]  Omega-3 Fatty Acids (FISH OIL)  1200 MG CAPS Take 2 capsules by mouth daily.    Yes [provider]  RESVERATROL PO Take 1 capsule by mouth daily. 200 mg   Yes [provider]  trimethoprim (TRIMPEX) 100 MG tablet Take 100 mg by mouth daily.    Yes [provider]    Physical Exam: Vitals:   06/04/17 2015 06/04/17 2100 06/04/17 2115 06/04/17 2145  BP: (!) 149/83 (!) 166/83 130/83 (!) 162/85  Pulse: 67 64 (!) 59 63  Resp: (!) 21 17 17 14   Temp:      TempSrc:      SpO2: 98% 100% 99% 98%  Weight:      Height:        Constitutional: NAD, calm, comfortable Eyes: PERRL, lids and conjunctivae normal ENMT: Mucous membranes are moist. Posterior pharynx clear of any exudate or lesions.Normal dentition.  Neck:  normal, supple, no masses, no thyromegaly Respiratory: clear to auscultation bilaterally, no wheezing, no crackles. Normal respiratory effort. No accessory muscle use.  Cardiovascular: Regular rate and rhythm, no murmurs / rubs / gallops. No extremity edema. 2+ pedal pulses. No carotid bruits.  Abdomen: no tenderness, no masses palpated. No hepatosplenomegaly. Bowel sounds positive.  Musculoskeletal: no clubbing / cyanosis. No joint deformity upper and lower extremities. Good ROM, no contractures. Normal muscle tone.  Skin: no rashes, lesions, ulcers. No induration Neurologic: CN 2-12 grossly intact. Sensation intact, DTR normal. Strength 5/5 in all 4.  Psychiatric: Normal judgment and insight. Alert and oriented x 3. Normal mood.    Labs on Admission: I have personally reviewed following labs and imaging studies  CBC:  Recent Labs Lab 06/04/17 1638 06/04/17 1654  WBC 10.1  --   NEUTROABS 8.5*  --   HGB 15.3* 16.3*  HCT 46.2* 48.0*  MCV 102.9*  --   PLT 157  --    Basic Metabolic Panel:  Recent Labs Lab 06/04/17 1638 06/04/17 1654  NA 137 140  K 4.1 4.0  CL 104 104  CO2 24  --   GLUCOSE 193* 198*  BUN 14 16  CREATININE 1.02* 0.90  CALCIUM 9.9  --    GFR: Estimated Creatinine Clearance: 37.8 mL/min (by C-G formula based on SCr of 0.9 mg/dL). Liver Function Tests:  Recent Labs Lab 06/04/17 1638  AST 37  ALT 43  ALKPHOS 52  BILITOT 0.5  PROT 7.5  ALBUMIN 3.9   No results for input(s): LIPASE, AMYLASE in the last 168 hours. No results for input(s): AMMONIA in the last 168 hours. Coagulation Profile:  Recent Labs Lab 06/04/17 1638  INR 0.93   Cardiac Enzymes: No results for input(s): CKTOTAL, CKMB, CKMBINDEX, TROPONINI in the last 168 hours. BNP (last 3 results) No results for input(s): PROBNP in the last 8760 hours. HbA1C: No results for input(s): HGBA1C in the last 72 hours. CBG: No results for input(s): GLUCAP in the last 168 hours. Lipid  Profile: No results for input(s): CHOL, HDL, LDLCALC, TRIG, CHOLHDL, LDLDIRECT in the last 72 hours. Thyroid Function Tests: No results for input(s): TSH, T4TOTAL, FREET4, T3FREE, THYROIDAB in the last 72 hours. Anemia Panel: No results for input(s): VITAMINB12, FOLATE, FERRITIN, TIBC, IRON, RETICCTPCT in the last 72 hours. Urine analysis:    Component Value Date/Time   COLORURINE YELLOW 03/20/2016 0806   APPEARANCEUR CLEAR 03/20/2016 0806   LABSPEC 1.018 03/20/2016 0806   PHURINE 6.0 03/20/2016 0806   GLUCOSEU NEGATIVE 03/20/2016 0806   HGBUR LARGE (A) 03/20/2016 0806   BILIRUBINUR NEGATIVE 03/20/2016  [place] 03/20/2016 0806   PROTEINUR NEGATIVE 03/20/2016 0806   NITRITE NEGATIVE 03/20/2016 0806   LEUKOCYTESUR SMALL (A) 03/20/2016 0806    Radiological Exams on Admission: Dg Elbow Complete Left  Result Date: 06/04/2017 CLINICAL DATA:  Acute left elbow pain following fall today. Initial encounter. EXAM: LEFT ELBOW - COMPLETE 3+ VIEW COMPARISON:  None. FINDINGS: There is no evidence of acute fracture, subluxation or dislocation. There is no evidence of joint effusion. Posterior soft tissue swelling is noted. IMPRESSION: Posterior soft tissue swelling without acute bony abnormality. Electronically Signed   By: Margarette Canada M.D.   On: 06/04/2017 16:57   Ct Head Wo Contrast  Result Date: 06/04/2017 CLINICAL DATA:  Pt found by son after having unwitnessed fall, pt does not recall event. Bloody right ear noted. EXAM: CT HEAD WITHOUT CONTRAST TECHNIQUE: Contiguous axial images were obtained from the base of the skull through the vertex without intravenous contrast. COMPARISON:  04/21/2017 FINDINGS: Brain: No evidence of acute infarction, hemorrhage, hydrocephalus, extra-axial collection or mass lesion/mass effect. There is ventricular and sulcal enlargement reflecting age related volume loss. Patchy white matter hypoattenuation is noted consistent with moderate to advanced  chronic microvascular ischemic change. There also several small more well-defined foci of hypoattenuation in the white matter, with another in the right inferior cerebellum, consistent with old lacune infarcts. Vascular: No hyperdense vessel or unexpected calcification. Skull: Normal. Negative for fracture or focal lesion. Sinuses/Orbits: Globes and orbits are unremarkable. Sinuses, mastoid air cells and middle ear cavities are clear. Other: None. IMPRESSION: 1. No acute intracranial abnormalities. 2. Age related volume loss, moderate to advanced chronic microvascular ischemic change and several old lacune infarcts, stable from the prior CT. Electronically Signed   By: Lajean Manes M.D.   On: 06/04/2017 17:20    EKG: Independently reviewed.  Assessment/Plan Principal Problem:   Syncope    1. Syncope - 1. Probably due to known VBI from proximal stenosis 2. Syncope pathway 3. MRI and EEG 4. Neuro eval in chart 5. Continue ASA/plavix 2. VBI due to proximal artery stenosis - 1. Holding metoprolol 2. Appointment with Dr. Estanislado Pandy (neurointerventional radiology) on Tuesday to discuss stenting.  DVT prophylaxis: Lovenox Code Status: Full Family Communication: Family at bedside Disposition Plan: Home after admit Consults called: Neuro Admission status: Place in obs   [place], [place] [facility] Pager [phone]  If 7AM-7PM, please contact day team taking care of patient [email or web address] Password [email or web address]  06/04/2017, 10:06 PM

## 2017-06-04 NOTE — ED Notes (Signed)
Attempted to call report x 2 w/o success.  Will attempt again.

## 2017-06-04 NOTE — Consult Note (Signed)
Neurology Consultation Reason for Consult: Memory lapse Referring Physician: Maryan Rued, W  CC: Memory lapse  History is obtained from: Patient, family  HPI: Tracey Hartman is a 83 y.o. 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  She states that the last thing that she remembers was counting pills with her son.  Her memory abruptly stops there and subsequently she remembers being in the bathroom try to watch blood out of her hair.  According to her son, he left after they were sent is counting pills that she was still in her normal state of health, other than complaining of some vertical diplopia.  Another family member then got a phone call from her and she appeared to be confused and therefore he called the same son who is been with her earlier and he went to her house.  She unlocked the door and [place], and he found her to have a cut ear and evidence that she had fallen.  She does not know the position of her arm or other specifics around when she fell given that she has a period of amnesia prior to the fall.  Though she has had some memory issues, she has not had a lapse in time such as this before.  LKW: 11/1 given that she was having diplopia this morning. tpa given?: no, outside of window   ROS: A 14 point ROS was performed and is negative except as noted in the HPI.  Past Medical History:  Diagnosis Date  . Coronary artery disease   . Hypertension   . IBS (irritable bowel syndrome)   . Osteoporosis   . Recurrent UTI   . Stroke [facility])      Family History  Problem Relation Age of Onset  . Kidney failure Mother        renal failure  . Heart attack Father      Social History:  reports that she has quit smoking. She has never used smokeless tobacco. She reports that she does not drink alcohol or use drugs.   Exam: Current vital signs: BP 130/83   Pulse (!) 59   Temp 98.4 F (36.9 C) (Oral)    Resp 17   Ht 5\' 3"  (1.6 m)   Wt 62.6 kg (138 lb)   SpO2 99%   BMI 24.45 kg/m  Vital signs in last 24 hours: Temp:  [98.4 F (36.9 C)] 98.4 F (36.9 C) (11/02 1626) Pulse Rate:  [59-84] 59 (11/02 2115) Resp:  [14-21] 17 (11/02 2115) BP: (130-166)/(81-110) 130/83 (11/02 2115) SpO2:  [94 %-100 %] 99 % (11/02 2115) Weight:  [62.6 kg (138 lb)] 62.6 kg (138 lb) (11/02 1632)   Physical Exam  Constitutional: Appears well-developed and well-nourished.  Psych: Affect appropriate to situation Eyes: No scleral injection HENT: No OP obstrucion Head: Normocephalic.  Cardiovascular: Normal rate and regular rhythm.  Respiratory: Effort normal and breath sounds normal to anterior ascultation GI: Soft.  No distension. There is no tenderness.  Skin: WDI  Neuro: Mental Status: Patient is awake, alert, oriented to person, place, month, year, and situation. Patient is able to give a clear and coherent history. No signs of aphasia or neglect Cranial Nerves: II: Visual Fields are full. Pupils are equal, round, and reactive to light.   III,IV, VI: EOMI without ptosis or diploplia.  V: Facial sensation is symmetric to temperature VII: Facial movement appears to have some very mild left-sided weakness at times.  VIII: hearing is intact to voice X: Uvula elevates symmetrically XI: Shoulder shrug is symmetric. XII: tongue is midline without atrophy or fasciculations.  Motor: Tone is normal. Bulk is normal. 5/5 strength was present in all four extremities.  Sensory: Sensation is symmetric to light touch and temperature in the arms and legs. 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  My suspicion is that she  gave herself a mild TBI following syncope.  Another possibility would be seizure, but I would not start empiric therapy with current information that we have.  An MRI could be helpful, if it shows areas of ischemia, then syncope/posterior circulation insufficiency would be much more likely as opposed to seizure.  Recommendations: 1) MRI brain 2) EEG 3) further recommendations following the above imaging.  Roland Rack, MD [facility] [phone]  If 7pm- 7am, please page neurology on call as listed in [place].

## 2017-06-04 NOTE — ED Notes (Signed)
Left ear cleaned.  Lidocaine at bedside.  Dr. Maryan Rued to bedside to suture laceration.

## 2017-06-04 NOTE — ED Notes (Signed)
Neurologist called this Rn and wants this pt to go back next due to sx and recent CT study. Nurse first notified and pt to go back next.

## 2017-06-04 NOTE — ED Triage Notes (Addendum)
Pt from home due to an unwitnessed fall. Pt does not remember the event but called her son into the house and she was standing at the door with blood on her ear and a bruised left elbow. Son states that there was blood in the dining room floor. Pt alert and oriented x 4 now. No neuro deficits. VSS. Pt is on plavix, bleeding stopped in right ear. Bandage in place. Pt has been having problems with her balance over the last month since her last TIA.

## 2017-06-04 NOTE — ED Provider Notes (Signed)
[facility]ENC[facility] DEPARTMENT Provider Note   CSN: [id number] Arrival date & time: 06/04/17  1620     History   Chief Complaint Chief Complaint  Patient presents with  . Fall    HPI Yadhira B Argyle is a 83 y.o. female.  Patient is an 83 year old female with a history of coronary artery disease status post stent, recent discovery of vertebral and subclavian artery stenosis waiting for stent, stroke, recurrent UTIs presenting today after what she thinks is a syncopal event.  Patient states that she woke up this morning and had double vision which she describes as 2 images stacked on top of each other and made her feel slightly dizzy.  She sat down at the kitchen table and the next thing she remembers is waking up at the kitchen table bleeding from her left ear and a knot on her head.  She got up and washed her hair and cleaned her cut and then called family members.  She does not recall having any chest pain, palpitations, shortness of breath, abdominal pain.  She has not had any of that since she is woken up.  She denies any nausea, vomiting or headache before or after the event.  She has never had anything like this before.  Patient does take Plavix and aspirin but denies any new medications.  Denies any infectious symptoms such as fever, cough or urinary symptoms.   The history is provided by the patient.  Fall  This is a new problem. Associated symptoms comments: Syncope and double vision. Nothing aggravates the symptoms. Nothing relieves the symptoms. She has tried nothing for the symptoms. The treatment provided significant relief.    Past Medical History:  Diagnosis Date  . Coronary artery disease   . Hypertension   . IBS (irritable bowel syndrome)   . Osteoporosis   . Recurrent UTI   . Stroke [facility])     Patient Active Problem List   Diagnosis Date Noted  . Lacunar infarction 02/20/2014  . Small vessel disease, cerebrovascular 02/20/2014  . Abnormality  of gait 02/20/2014  . HYPERCHOLESTEROLEMIA  IIA 08/25/2010  . CORONARY ATHEROSCLEROSIS NATIVE CORONARY ARTERY 08/25/2010  . CHEST PAIN, PRECORDIAL 08/25/2010    Past Surgical History:  Procedure Laterality Date  . APPENDECTOMY    . CATARACT EXTRACTION Bilateral   . COLONOSCOPY    . IR ANGIO INTRA EXTRACRAN SEL COM CAROTID INNOMINATE BILAT MOD SED  05/13/2017  . IR ANGIO VERTEBRAL SEL SUBCLAVIAN INNOMINATE UNI R MOD SED  05/13/2017  . IR ANGIO VERTEBRAL SEL VERTEBRAL UNI L MOD SED  05/13/2017    OB History    No data available       Home Medications    Prior to Admission medications   Medication Sig Start Date End Date Taking? Authorizing Provider  aspirin EC 81 MG tablet Take 1 tablet (81 mg total) by mouth daily. 06/03/14   Philmore Pali, NP  Biotin 10 MG TABS Take 1 tablet by mouth daily.    [provider]  Calcium Carbonate-Vitamin D 600-400 MG-UNIT tablet Take 2 tablets by mouth daily with breakfast.     [provider]  clopidogrel (PLAVIX) 75 MG tablet Take 1 tablet (75 mg total) by mouth daily. 04/28/17   Garvin Fila, MD  Cranberry 1000 MG CAPS Take 1,000 mg by mouth daily.    [provider]  GARLIC PO Take 1 capsule by mouth daily.    [provider]  metoprolol (  LOPRESSOR) 50 MG tablet Take 25 tablets by mouth daily. Patient takes half a pill  25mg  daily 02/13/14   [provider]  Multiple Vitamins-Minerals (MULTIVITAMIN WITH MINERALS) tablet Take 1 tablet by mouth daily.    [provider]  Omega-3 Fatty Acids (FISH OIL) 1200 MG CAPS Take 2 capsules by mouth daily.     [provider]  RESVERATROL PO Take 1 capsule by mouth daily. 200 mg    [provider]  trimethoprim (TRIMPEX) 100 MG tablet Take 100 mg by mouth daily.     [provider]    Family History Family History  Problem Relation Age of Onset  . Kidney failure Mother        renal failure  . Heart attack Father      Social History Social History  Substance Use Topics  . Smoking status: Former Research scientist (life sciences)  . Smokeless tobacco: Never Used  . Alcohol use No     Comment: patient drinks coffee     Allergies   Codeine   Review of Systems Review of Systems  All other systems reviewed and are negative.    Physical Exam Updated Vital Signs BP (!) 154/110   Pulse 62   Temp 98.4 F (36.9 C) (Oral)   Resp 18   Ht 5\' 3"  (1.6 m)   Wt 62.6 kg (138 lb)   SpO2 94%   BMI 24.45 kg/m   Physical Exam  Constitutional: She is oriented to person, place, and time. She appears well-developed and well-nourished. No distress.  HENT:  Head: Normocephalic. Head is with contusion.    Left Ear: Left ear exhibits lacerations.  Ears:  Mouth/Throat: Oropharynx is clear and moist.  Eyes: Pupils are equal, round, and reactive to light. Conjunctivae and EOM are normal.  Neck: Normal range of motion. Neck supple.  Cardiovascular: Normal rate, regular rhythm and intact distal pulses.   No murmur heard. Pulmonary/Chest: Effort normal and breath sounds normal. No respiratory distress. She has no wheezes. She has no rales.  Abdominal: Soft. She exhibits no distension. There is no tenderness. There is no rebound and no guarding.  Musculoskeletal: Normal range of motion. She exhibits no edema.       Left forearm: She exhibits tenderness and swelling.       Arms: Neurological: She is alert and oriented to person, place, and time. She has normal strength. No cranial nerve deficit or sensory deficit. Coordination normal.  5 out of 5 strength in bilateral upper and lower extremities.  No pronator drift in the upper or lower extremities.  No visual field cuts.  Extraocular movements are intact.  Speech without evidence of slurred speech or aphasia.  Normal heel to shin testing bilaterally.  Skin: Skin is warm and dry. No rash noted. No erythema.  Psychiatric: She has a normal mood and affect. Her behavior is normal.   Nursing note and vitals reviewed.    ED Treatments / Results  Labs (all labs ordered are listed, but only abnormal results are displayed) Labs Reviewed  CBC - Abnormal; Notable for the following:       Result Value   Hemoglobin 15.3 (*)    HCT 46.2 (*)    MCV 102.9 (*)    MCH 34.1 (*)    All other components within normal limits  DIFFERENTIAL - Abnormal; Notable for the following:    Neutro Abs 8.5 (*)    All other components within normal limits  COMPREHENSIVE METABOLIC PANEL -  Abnormal; Notable for the following:    Glucose, Bld 193 (*)    Creatinine, Ser 1.02 (*)    GFR calc non Af Amer 49 (*)    GFR calc Af Amer 56 (*)    All other components within normal limits  I-STAT CHEM 8, ED - Abnormal; Notable for the following:    Glucose, Bld 198 (*)    Hemoglobin 16.3 (*)    HCT 48.0 (*)    All other components within normal limits  PROTIME-INR  APTT  I-STAT TROPONIN, ED    EKG  EKG Interpretation  Date/Time:  Friday June 04 2017 19:29:30 EDT Ventricular Rate:  66 PR Interval:    QRS Duration: 80 QT Interval:  421 QTC Calculation: 442 R Axis:   74 Text Interpretation:  Sinus rhythm Borderline T abnormalities, lateral leads Baseline wander Artifact No previous tracing Confirmed by Blanchie Dessert [id number]) on 06/04/2017 7:58:22 PM       Radiology Dg Elbow Complete Left  Result Date: 06/04/2017 CLINICAL DATA:  Acute left elbow pain following fall today. Initial encounter. EXAM: LEFT ELBOW - COMPLETE 3+ VIEW COMPARISON:  None. FINDINGS: There is no evidence of acute fracture, subluxation or dislocation. There is no evidence of joint effusion. Posterior soft tissue swelling is noted. IMPRESSION: Posterior soft tissue swelling without acute bony abnormality. Electronically Signed   By: Margarette Canada M.D.   On: 06/04/2017 16:57   Ct Head Wo Contrast  Result Date: 06/04/2017 CLINICAL DATA:  Pt found by son after having unwitnessed fall, pt does not recall event.  Bloody right ear noted. EXAM: CT HEAD WITHOUT CONTRAST TECHNIQUE: Contiguous axial images were obtained from the base of the skull through the vertex without intravenous contrast. COMPARISON:  04/21/2017 FINDINGS: Brain: No evidence of acute infarction, hemorrhage, hydrocephalus, extra-axial collection or mass lesion/mass effect. There is ventricular and sulcal enlargement reflecting age related volume loss. Patchy white matter hypoattenuation is noted consistent with moderate to advanced chronic microvascular ischemic change. There also several small more well-defined foci of hypoattenuation in the white matter, with another in the right inferior cerebellum, consistent with old lacune infarcts. Vascular: No hyperdense vessel or unexpected calcification. Skull: Normal. Negative for fracture or focal lesion. Sinuses/Orbits: Globes and orbits are unremarkable. Sinuses, mastoid air cells and middle ear cavities are clear. Other: None. IMPRESSION: 1. No acute intracranial abnormalities. 2. Age related volume loss, moderate to advanced chronic microvascular ischemic change and several old lacune infarcts, stable from the prior CT. Electronically Signed   By: Lajean Manes M.D.   On: 06/04/2017 17:20    Procedures Procedures (including critical care time) LACERATION REPAIR Performed by: Blanchie Dessert Authorized by: Blanchie Dessert Consent: Verbal consent obtained. Risks and benefits: risks, benefits and alternatives were discussed Consent given by: patient Patient identity confirmed: provided demographic data Prepped and Draped in normal sterile fashion Wound explored  Laceration Location: left ear  Laceration Length: 3cm  No Foreign Bodies seen or palpated  Anesthesia: auricular block  Local anesthetic: lidocaine 1% without epinephrine  Anesthetic total: 4 ml  Irrigation method: syringe Amount of cleaning: standard  Skin closure: 6.0 vicyrl  Number of sutures: 15  Technique: simple  interrupted placed deep in the cartilage and then externally.  Pressure bandage placed around the ear to prevent hematoma  Patient tolerance: Patient tolerated the procedure well with no immediate complications.  Medications Ordered in ED Medications  lidocaine (XYLOCAINE) 2 % (with pres) injection 200 mg (not administered)     Initial Impression /  Assessment and Plan / ED Course  I have reviewed the triage vital signs and the nursing notes.  Pertinent labs & imaging results that were available during my care of the patient were reviewed by me and considered in my medical decision making (see chart for details).     Elderly female presenting today after a syncopal event versus seizure.  Patient has injury to the left ear, elbow and the posterior portion of her head.  Prior to the incident she states she had double vision.  She no longer has the symptoms and has been symptom-free since waking up.  She does have a deep laceration going through the cartilage of her left ear without hematoma present.  Head CT was negative for acute injury, elbow film is within normal limits.  EKG without acute findings and labs unremarkable.  Spoke with neurology who recommends observation and admission.  They think she may need a repeat MRI and possibly an EEG.  Final Clinical Impressions(s) / ED Diagnoses   Final diagnoses:  Syncope, unspecified syncope type  Complex laceration of ear, initial encounter    New Prescriptions New Prescriptions   No medications on file     Blanchie Dessert, MD 06/05/17 0011

## 2017-06-05 ENCOUNTER — Observation Stay (HOSPITAL_COMMUNITY): Payer: Medicare Other

## 2017-06-05 DIAGNOSIS — S01319A Laceration without foreign body of unspecified ear, initial encounter: Secondary | ICD-10-CM

## 2017-06-05 DIAGNOSIS — Z8673 Personal history of transient ischemic attack (TIA), and cerebral infarction without residual deficits: Secondary | ICD-10-CM | POA: Diagnosis not present

## 2017-06-05 DIAGNOSIS — R4182 Altered mental status, unspecified: Secondary | ICD-10-CM

## 2017-06-05 DIAGNOSIS — I6330 Cerebral infarction due to thrombosis of unspecified cerebral artery: Secondary | ICD-10-CM | POA: Diagnosis not present

## 2017-06-05 DIAGNOSIS — G458 Other transient cerebral ischemic attacks and related syndromes: Secondary | ICD-10-CM | POA: Diagnosis not present

## 2017-06-05 DIAGNOSIS — S0990XA Unspecified injury of head, initial encounter: Secondary | ICD-10-CM | POA: Diagnosis not present

## 2017-06-05 DIAGNOSIS — R739 Hyperglycemia, unspecified: Secondary | ICD-10-CM | POA: Diagnosis not present

## 2017-06-05 DIAGNOSIS — R55 Syncope and collapse: Secondary | ICD-10-CM | POA: Diagnosis not present

## 2017-06-05 MED ORDER — INFLUENZA VAC SPLIT HIGH-DOSE 0.5 ML IM SUSY
0.5000 mL | PREFILLED_SYRINGE | Freq: Once | INTRAMUSCULAR | Status: AC
  Administered 2017-06-05: 0.5 mL via INTRAMUSCULAR
  Filled 2017-06-05: qty 0.5

## 2017-06-05 MED ORDER — PNEUMOCOCCAL VAC POLYVALENT 25 MCG/0.5ML IJ INJ
0.5000 mL | INJECTION | Freq: Once | INTRAMUSCULAR | Status: AC
  Administered 2017-06-05: 0.5 mL via INTRAMUSCULAR

## 2017-06-05 MED ORDER — ATORVASTATIN CALCIUM 40 MG PO TABS: 40.0000 mg | ORAL_TABLET | Freq: Every day | ORAL | 11 refills | Status: DC

## 2017-06-05 NOTE — Procedures (Signed)
Date of recording 06/05/2017  Referring physician Jennette Kettle  Reason for the study Altered mental status.  Technical Digital EEG recording using 10-20 electrode system  Description of the recording Most of the recording was in drowsy state which comprised of generalized delta and theta slowing, briefly when awake posterior dominant rhythm was up to 9 Hz symmetrical and reactive. Epileptiform features were not seen during this recording.  Impression The EEG is normal with patient recorded in awake and drowsy state only.Marland Kitchen

## 2017-06-05 NOTE — Progress Notes (Signed)
Patient admitted the floor from ED via stretcher with son at side.  Patient is alert and oriented, but only tell me "I'm 82 something" She had an episode in her home, where she found herself lying on the floor, with blood all over her. Reportedly, she called her son and ems was called.  She has a laceration on her left ear lobe, sutured. She had a ecchymotic raised bump on the top left side of her hed. She has a large hematoma to the L elbow area. I also saw that she has an small abrasion to the 2nd little toe on her R foot. Armband identified, fall risk applied. She is on cardiac monitor #31, nsr.  Denies pain. Bedside rails up X3, alarm on. Safety maintained.  Will continue to monitor.

## 2017-06-05 NOTE — Progress Notes (Signed)
Called son Shoshana Johal with instructions to hold Metoprolol for 48 hrs then resume per Dr Quincy Simmonds instructions.

## 2017-06-05 NOTE — Discharge Summary (Addendum)
Physician Discharge Summary  Tracey Hartman  LXB:[id number]  DOB: 1930-06-25  DOA: 06/04/2017 PCP: Mayra Neer, MD  Admit date: 06/04/2017 Discharge date: 06/05/2017  Admitted From: Home  Disposition:  Home   Recommendations for Outpatient Follow-up:  1. Follow up with PCP in 1-2 weeks 2. Follow-up with Dr. Leonie Man in 2-3 weeks 3. Hold blood pressure medication for the next 48 hours to allow permissive hypertension 4. Follow-up with IR on 06/08/17 - Dr Estanislado Pandy for possible stenting 5. Please obtain BMP/CBC in one week to monitor hemoglobin and renal function 6. Outpatient physical therapy -ambulatory referral for PT was given 7. Obtain lipid panel and A1c  Discharge Condition: Stable CODE STATUS: Full code Diet recommendation: Heart Healthy   Brief/Interim Summary: For full details see H&P/progress note but in brief, Tracey Hartman is a 83 year old female with medical history significant for CAD, stroke and being worked up for stenosis of subclavian artery/still's syndrome, presented to the emergency department after a syncopal episode and vertical diplopia.  Upon ED evaluation patient had a laceration on her ear, she did not remember much of what happened and neurology was consulted.  CT head was negative but neurology recommended to admit for stroke workup.  MRI was performed which was consistent with stroke cerebellar peduncle and thalamus, EEG also was performed which was negative.  Stroke team evaluated patient and recommended to follow-up as an outpatient with Dr. Estanislado Pandy as patient has already been scheduled for possible stenting of the subclavian artery.  Subjective: Patient seen and examined, has no complaints.  Patient reports feeling well and back to baseline.  No acute events overnight.  Patient denies chest pain, short of breath, dizziness and weakness.  Ambulated with PT with no issues and tolerated diet well.  Discharge Diagnoses/Hospital Course:  Acute CVA Felt to be  secondary to subclavian steal syndrome, posterior circulation insufficient, resulting in stroke and syncopal episode MRI of the brain shows acute subcentimeter right cerebral peduncle and thalamus infarct, subcentimeter subacute hemorrhagic left basal ganglia to internal capsule IR Angiogram Dr Estanislado Pandy 05/13/2017 Approximately 85% stenosis of the left subclavian artery at its origin and just proximal to this. Slow antegrade ascent of contrast is seen into the left vertebral artery. Approximately 50% stenosis of the left vertebral artery at the level of C1. Approximately 80% stenosis of the proximal superior division of the right middle cerebral artery. Lipid panel and A1c were not ordered during admission recommend to obtain this as an outpatient Echocardiogram 05/13/2017 EF 55-60% no cardiac source of emboli identified Patient on aspirin and Plavix, recommend a statin as well. Follow with neurologist.  Left subclavian steal syndrome Patient scheduled for stent likely next Tuesday by IR This is contributing to syncopal episode and infarction  Syncope secondary to steal syndrome See above  Hypertension Allow permissive hypertension Holding metoprolol for the next 48 hours BP to normalize in the next 5-7 days  Laceration to left earlobe Repair by ED physician Remove stitches with PCP in 7 days  All other chronic medical condition were stable during the hospitalization.  Patient was seen by physical therapy, recommending outpatient physical therapy On the day of the discharge the patient's vitals were stable, and no other acute medical condition were reported by patient. the patient was felt safe to be discharge to home  Discharge Instructions  You were cared for by a hospitalist during your hospital stay. If you have any questions about your discharge medications or the care you received while you were in  the hospital after you are discharged, you can call the unit and asked to  speak with the hospitalist on call if the hospitalist that took care of you is not available. Once you are discharged, your primary care physician will handle any further medical issues. Please note that NO REFILLS for any discharge medications will be authorized once you are discharged, as it is imperative that you return to your primary care physician (or establish a relationship with a primary care physician if you do not have one) for your aftercare needs so that they can reassess your need for medications and monitor your lab values.  Discharge Instructions    Ambulatory referral to Physical Therapy    Complete by:  As directed    Call MD for:  difficulty breathing, headache or visual disturbances    Complete by:  As directed    Call MD for:  extreme fatigue    Complete by:  As directed    Call MD for:  hives    Complete by:  As directed    Call MD for:  persistant dizziness or light-headedness    Complete by:  As directed    Call MD for:  persistant nausea and vomiting    Complete by:  As directed    Call MD for:  redness, tenderness, or signs of infection (pain, swelling, redness, odor or green/yellow discharge around incision site)    Complete by:  As directed    Call MD for:  severe uncontrolled pain    Complete by:  As directed    Call MD for:  temperature >100.4    Complete by:  As directed    Diet - low sodium heart healthy    Complete by:  As directed    Increase activity slowly    Complete by:  As directed      Allergies as of 06/05/2017      Reactions   Codeine       Medication List    STOP taking these medications   metoprolol tartrate 50 MG tablet Commonly known as:  LOPRESSOR   RESVERATROL PO     TAKE these medications   aspirin EC 81 MG tablet Take 1 tablet (81 mg total) by mouth daily.   atorvastatin 40 MG tablet Commonly known as:  LIPITOR Take 1 tablet (40 mg total) by mouth daily.   Biotin 10 MG Tabs Take 1 tablet by mouth daily.   Calcium  Carbonate-Vitamin D 600-400 MG-UNIT tablet Take 2 tablets by mouth daily with breakfast.   clopidogrel 75 MG tablet Commonly known as:  PLAVIX Take 1 tablet (75 mg total) by mouth daily.   Cranberry 1000 MG Caps Take 1,000 mg by mouth daily.   Fish Oil 1200 MG Caps Take 2 capsules by mouth daily.   GARLIC PO Take 1 capsule by mouth daily.   multivitamin with minerals tablet Take 1 tablet by mouth daily.   trimethoprim 100 MG tablet Commonly known as:  TRIMPEX Take 100 mg by mouth daily.      Follow-up Information    Mayra Neer, MD. Schedule an appointment as soon as possible for a visit in 1 week(s).   Specialty:  Family Medicine Why:  Hospital follow up  Contact information: [address] [place] [address] [phone]        Garvin Fila, MD. Schedule an appointment as soon as possible for a visit in 2 week(s).   Specialties:  Neurology, Radiology  Why:  Stroke follow up  Contact information: [address] [place] [address] [phone]        Luanne Bras, MD. Go on 06/08/2017.   Specialty:  Interventional Radiology Why:  Keep appointment  Contact information: [address] [place] [address] [phone]          Allergies  Allergen Reactions  . Codeine     Consultations:  Neurology   Procedures/Studies: Dg Elbow Complete Left  Result Date: 06/04/2017 CLINICAL DATA:  Acute left elbow pain following fall today. Initial encounter. EXAM: LEFT ELBOW - COMPLETE 3+ VIEW COMPARISON:  None. FINDINGS: There is no evidence of acute fracture, subluxation or dislocation. There is no evidence of joint effusion. Posterior soft tissue swelling is noted. IMPRESSION: Posterior soft tissue swelling without acute bony abnormality. Electronically Signed   By: Margarette Canada M.D.   On: 06/04/2017 16:57   Ct Head Wo Contrast  Result Date: 06/04/2017 CLINICAL DATA:  Pt found by son after having  unwitnessed fall, pt does not recall event. Bloody right ear noted. EXAM: CT HEAD WITHOUT CONTRAST TECHNIQUE: Contiguous axial images were obtained from the base of the skull through the vertex without intravenous contrast. COMPARISON:  04/21/2017 FINDINGS: Brain: No evidence of acute infarction, hemorrhage, hydrocephalus, extra-axial collection or mass lesion/mass effect. There is ventricular and sulcal enlargement reflecting age related volume loss. Patchy white matter hypoattenuation is noted consistent with moderate to advanced chronic microvascular ischemic change. There also several small more well-defined foci of hypoattenuation in the white matter, with another in the right inferior cerebellum, consistent with old lacune infarcts. Vascular: No hyperdense vessel or unexpected calcification. Skull: Normal. Negative for fracture or focal lesion. Sinuses/Orbits: Globes and orbits are unremarkable. Sinuses, mastoid air cells and middle ear cavities are clear. Other: None. IMPRESSION: 1. No acute intracranial abnormalities. 2. Age related volume loss, moderate to advanced chronic microvascular ischemic change and several old lacune infarcts, stable from the prior CT. Electronically Signed   By: Lajean Manes M.D.   On: 06/04/2017 17:20   Mr Brain Wo Contrast  Result Date: 06/05/2017 CLINICAL DATA:  Syncopal episode. Fell and hit head. Evaluate recurrent syncope. History of hypertension, stroke. EXAM: MRI HEAD WITHOUT CONTRAST TECHNIQUE: Multiplanar, multiecho pulse sequences of the brain and surrounding structures were obtained without intravenous contrast. COMPARISON:  CT HEAD April 21, 2017 and MRI of the head March 11, 2017 FINDINGS: BRAIN: Multiple subcentimeter to punctate foci of reduced diffusion RIGHT cerebral peduncle and inferior RIGHT thalamus with low ADC values though punctate lesions are difficult to evaluate. Subcentimeter reduced diffusion and faint T1 shortening LEFT basal ganglia to  internal capsule, new from prior MRI. Numerous supra and infratentorial scattered chronic micro hemorrhages, nonspecific distribution. Confluent supratentorial and patchy pontine white matter FLAIR T2 hyperintensities. Prominent perivascular spaces basal ganglia and thalamus associated with chronic small vessel ischemic disease. Old small RIGHT cerebellar infarct. Ventricles and sulci are normal for patient's age. No midline shift, mass effect or masses. No abnormal extra-axial fluid collections. VASCULAR: Normal major intracranial vascular flow voids present at skull base. SKULL AND UPPER CERVICAL SPINE: No abnormal sellar expansion. No suspicious calvarial bone marrow signal. Craniocervical junction maintained. Moderate LEFT parietal scalp hematoma. SINUSES/ORBITS: The mastoid air-cells and included paranasal sinuses are well-aerated. The included ocular globes and orbital contents are non-suspicious. Status post bilateral ocular lens implants. OTHER: None. IMPRESSION: 1. Acute subcentimeter RIGHT cerebral peduncle and thalamus infarcts. 2. Subcentimeter subacute hemorrhage LEFT basal ganglia to  internal capsule. 3. Severe chronic small vessel ischemic disease. Old small RIGHT cerebellar infarct. 4. Numerous chronic micro hemorrhages seen with amyloid angiopathy and, severe chronic hypertension. Electronically Signed   By: Elon Alas M.D.   On: 06/05/2017 01:23   Ir Angio Intra Extracran Sel Com Carotid Innominate Bilat Mod Sed  Result Date: 05/18/2017 CLINICAL DATA:  Vertebrobasilar insufficiency syndrome. EXAM: BILATERAL COMMON CAROTID AND INNOMINATE ANGIOGRAPHY; IR ANGIO VERTEBRAL SEL SUBCLAVIAN INNOMINATE UNI RIGHT MOD SED; IR ANGIO VERTEBRAL SEL VERTEBRAL UNI LEFT MOD SED COMPARISON:  CT angiogram of the head and neck of 04/21/2017. MEDICATIONS: Heparin 1000 units IV; no antibiotic was administered within 1 hour of the procedure. ANESTHESIA/SEDATION: Versed 1 mg IV; Fentanyl 25 mcg IV. Moderate  Sedation Time:  40 minutes. The patient was continuously monitored during the procedure by the interventional radiology nurse under my direct supervision. CONTRAST:  Isovue 300 approximately 80 cc. FLUOROSCOPY TIME:  Fluoroscopy Time: 9 minutes 36 seconds 1248 mGy). COMPLICATIONS: None immediate. TECHNIQUE: Informed written consent was obtained from the patient after a thorough discussion of the procedural risks, benefits and alternatives. All questions were addressed. Maximal Sterile Barrier Technique was utilized including caps, mask, sterile gowns, sterile gloves, sterile drape, hand hygiene and skin antiseptic. A timeout was performed prior to the initiation of the procedure. The right groin was prepped and draped in the usual sterile fashion. Thereafter using modified Seldinger technique, transfemoral access into the right common femoral artery was obtained without difficulty. Over a 0.035 inch guidewire, a 5 French Pinnacle sheath was inserted. Through this, and also over 0.035 inch guidewire, a 5 [place] JB 1 catheter was advanced to the aortic arch region and selectively positioned in the innominate artery , the right vertebral artery, the left common carotid artery and the left vertebral artery, and the aortic arch. FINDINGS: The left subclavian arteriogram demonstrates approximately 85% stenosis of the proximal left subclavian artery. The origin of the left vertebral artery is widely patent with mild to moderate tortuosity just distal to this. There is slow ascent of contrast in the left vertebral artery to the cranial skull base. There is approximately 50% stenosis of the left vertebral artery at the level of C1. Distal to this the left vertebral artery and the left vertebrobasilar junction demonstrate wide patency. Normal opacification is seen of the left posterior inferior cerebellar artery and the left vertebrobasilar junction. The opacified portion of the basilar artery, the posterior cerebral  arteries, the superior cerebellar arteries and the anterior-inferior cerebellar arteries is normal into the capillary and venous phases. The innominate artery injection demonstrates wide patency of the right subclavian artery and the left common carotid artery. The right common carotid bifurcation demonstrates the right external carotid artery and its major branches to be widely patent. The right internal carotid artery at the bulb to the cranial skull base opacifies normally. The petrous, cavernous and the supraclinoid segments are widely patent. The right posterior communicating artery is seen opacifying the right posterior cerebral artery distribution. The right M1 segment is widely patent. The inferior division of the right middle cerebral artery demonstrates mild atherosclerotic irregularity in the M2 M3 region. The superior division demonstrates approximately 80% stenosss of the proximal portion of the superior division. Distal to this the M2 M3 regions of the superior division opacify into the capillary and venous phases. The right anterior cerebral A1 segment and distally demonstrates wide patency. The right vertebral artery origin is while widely patent. The vessel is seen to opacify normally to  the cranial skull base. Normal opacification is seen of the right vertebrobasilar junction and the right posterior inferior cerebellar artery. The opacified portion of the basilar artery, the posterior cerebral artery, the superior cerebellar arteries and the anterior inferior cerebellar arteries is normal into the capillary and venous phases. The left common carotid arteriogram demonstrates the left external carotid artery and its major branches to be widely patent. The left internal carotid artery at the bulb demonstrates a shallow smooth plaque along the posterior wall without significant stenosis by the NASCET criteria. The vessel distal to this is seen to opacify normally to the cranial skull base. The petrous,  cavernous and the supraclinoid segments demonstrate wide patency. The left middle cerebral artery has mild caliber irregularity in the left M1 segment. The trifurcation branches, otherwise, opacify normally into the capillary and the venous phases. The left anterior cerebral artery opacifies into the capillary and venous phases. Mild caliber irregularity involving the pericallosal artery at the level of the body of the corpus callosum probably represents mild intracranial arteriosclerosis. Flash filling of the right anterior cerebral artery A2 segment is seen via the anterior communicating artery. The arteriogram demonstrates wide patency of the innominate artery, and the left common carotid artery. Again demonstrated is the high-grade stenosis of the left subclavian artery proximally. IMPRESSION: Approximately 85% stenosis of the left subclavian artery at its origin and just proximal to this. Slow antegrade ascent of contrast is seen into the left vertebral artery. Approximately 50% stenosis of the left vertebral artery at the level of C1. Approximately 80% stenosis of the proximal superior division of the right middle cerebral artery. PLAN: Continue with medical management at this time. Consultation for endovascular revascularization of high-grade stenosis of the left subclavian artery proximally. Electronically Signed   By: Luanne Bras M.D.   On: 05/13/2017 15:00   Ir Angio Vertebral Sel Subclavian Innominate Uni R Mod Sed  Result Date: 05/18/2017 CLINICAL DATA:  Vertebrobasilar insufficiency syndrome. EXAM: BILATERAL COMMON CAROTID AND INNOMINATE ANGIOGRAPHY; IR ANGIO VERTEBRAL SEL SUBCLAVIAN INNOMINATE UNI RIGHT MOD SED; IR ANGIO VERTEBRAL SEL VERTEBRAL UNI LEFT MOD SED COMPARISON:  CT angiogram of the head and neck of 04/21/2017. MEDICATIONS: Heparin 1000 units IV; no antibiotic was administered within 1 hour of the procedure. ANESTHESIA/SEDATION: Versed 1 mg IV; Fentanyl 25 mcg IV. Moderate  Sedation Time:  40 minutes. The patient was continuously monitored during the procedure by the interventional radiology nurse under my direct supervision. CONTRAST:  Isovue 300 approximately 80 cc. FLUOROSCOPY TIME:  Fluoroscopy Time: 9 minutes 36 seconds 1248 mGy). COMPLICATIONS: None immediate. TECHNIQUE: Informed written consent was obtained from the patient after a thorough discussion of the procedural risks, benefits and alternatives. All questions were addressed. Maximal Sterile Barrier Technique was utilized including caps, mask, sterile gowns, sterile gloves, sterile drape, hand hygiene and skin antiseptic. A timeout was performed prior to the initiation of the procedure. The right groin was prepped and draped in the usual sterile fashion. Thereafter using modified Seldinger technique, transfemoral access into the right common femoral artery was obtained without difficulty. Over a 0.035 inch guidewire, a 5 French Pinnacle sheath was inserted. Through this, and also over 0.035 inch guidewire, a 5 [place] JB 1 catheter was advanced to the aortic arch region and selectively positioned in the innominate artery , the right vertebral artery, the left common carotid artery and the left vertebral artery, and the aortic arch. FINDINGS: The left subclavian arteriogram demonstrates approximately 85% stenosis of the proximal left subclavian artery.  The origin of the left vertebral artery is widely patent with mild to moderate tortuosity just distal to this. There is slow ascent of contrast in the left vertebral artery to the cranial skull base. There is approximately 50% stenosis of the left vertebral artery at the level of C1. Distal to this the left vertebral artery and the left vertebrobasilar junction demonstrate wide patency. Normal opacification is seen of the left posterior inferior cerebellar artery and the left vertebrobasilar junction. The opacified portion of the basilar artery, the posterior cerebral  arteries, the superior cerebellar arteries and the anterior-inferior cerebellar arteries is normal into the capillary and venous phases. The innominate artery injection demonstrates wide patency of the right subclavian artery and the left common carotid artery. The right common carotid bifurcation demonstrates the right external carotid artery and its major branches to be widely patent. The right internal carotid artery at the bulb to the cranial skull base opacifies normally. The petrous, cavernous and the supraclinoid segments are widely patent. The right posterior communicating artery is seen opacifying the right posterior cerebral artery distribution. The right M1 segment is widely patent. The inferior division of the right middle cerebral artery demonstrates mild atherosclerotic irregularity in the M2 M3 region. The superior division demonstrates approximately 80% stenosss of the proximal portion of the superior division. Distal to this the M2 M3 regions of the superior division opacify into the capillary and venous phases. The right anterior cerebral A1 segment and distally demonstrates wide patency. The right vertebral artery origin is while widely patent. The vessel is seen to opacify normally to the cranial skull base. Normal opacification is seen of the right vertebrobasilar junction and the right posterior inferior cerebellar artery. The opacified portion of the basilar artery, the posterior cerebral artery, the superior cerebellar arteries and the anterior inferior cerebellar arteries is normal into the capillary and venous phases. The left common carotid arteriogram demonstrates the left external carotid artery and its major branches to be widely patent. The left internal carotid artery at the bulb demonstrates a shallow smooth plaque along the posterior wall without significant stenosis by the NASCET criteria. The vessel distal to this is seen to opacify normally to the cranial skull base. The petrous,  cavernous and the supraclinoid segments demonstrate wide patency. The left middle cerebral artery has mild caliber irregularity in the left M1 segment. The trifurcation branches, otherwise, opacify normally into the capillary and the venous phases. The left anterior cerebral artery opacifies into the capillary and venous phases. Mild caliber irregularity involving the pericallosal artery at the level of the body of the corpus callosum probably represents mild intracranial arteriosclerosis. Flash filling of the right anterior cerebral artery A2 segment is seen via the anterior communicating artery. The arteriogram demonstrates wide patency of the innominate artery, and the left common carotid artery. Again demonstrated is the high-grade stenosis of the left subclavian artery proximally. IMPRESSION: Approximately 85% stenosis of the left subclavian artery at its origin and just proximal to this. Slow antegrade ascent of contrast is seen into the left vertebral artery. Approximately 50% stenosis of the left vertebral artery at the level of C1. Approximately 80% stenosis of the proximal superior division of the right middle cerebral artery. PLAN: Continue with medical management at this time. Consultation for endovascular revascularization of high-grade stenosis of the left subclavian artery proximally. Electronically Signed   By: Luanne Bras M.D.   On: 05/13/2017 15:00   Ir Angio Vertebral Sel Vertebral Uni L Mod Sed  Result Date:  05/18/2017 CLINICAL DATA:  Vertebrobasilar insufficiency syndrome. EXAM: BILATERAL COMMON CAROTID AND INNOMINATE ANGIOGRAPHY; IR ANGIO VERTEBRAL SEL SUBCLAVIAN INNOMINATE UNI RIGHT MOD SED; IR ANGIO VERTEBRAL SEL VERTEBRAL UNI LEFT MOD SED COMPARISON:  CT angiogram of the head and neck of 04/21/2017. MEDICATIONS: Heparin 1000 units IV; no antibiotic was administered within 1 hour of the procedure. ANESTHESIA/SEDATION: Versed 1 mg IV; Fentanyl 25 mcg IV. Moderate Sedation Time:  40  minutes. The patient was continuously monitored during the procedure by the interventional radiology nurse under my direct supervision. CONTRAST:  Isovue 300 approximately 80 cc. FLUOROSCOPY TIME:  Fluoroscopy Time: 9 minutes 36 seconds 1248 mGy). COMPLICATIONS: None immediate. TECHNIQUE: Informed written consent was obtained from the patient after a thorough discussion of the procedural risks, benefits and alternatives. All questions were addressed. Maximal Sterile Barrier Technique was utilized including caps, mask, sterile gowns, sterile gloves, sterile drape, hand hygiene and skin antiseptic. A timeout was performed prior to the initiation of the procedure. The right groin was prepped and draped in the usual sterile fashion. Thereafter using modified Seldinger technique, transfemoral access into the right common femoral artery was obtained without difficulty. Over a 0.035 inch guidewire, a 5 French Pinnacle sheath was inserted. Through this, and also over 0.035 inch guidewire, a 5 [place] JB 1 catheter was advanced to the aortic arch region and selectively positioned in the innominate artery , the right vertebral artery, the left common carotid artery and the left vertebral artery, and the aortic arch. FINDINGS: The left subclavian arteriogram demonstrates approximately 85% stenosis of the proximal left subclavian artery. The origin of the left vertebral artery is widely patent with mild to moderate tortuosity just distal to this. There is slow ascent of contrast in the left vertebral artery to the cranial skull base. There is approximately 50% stenosis of the left vertebral artery at the level of C1. Distal to this the left vertebral artery and the left vertebrobasilar junction demonstrate wide patency. Normal opacification is seen of the left posterior inferior cerebellar artery and the left vertebrobasilar junction. The opacified portion of the basilar artery, the posterior cerebral arteries, the superior  cerebellar arteries and the anterior-inferior cerebellar arteries is normal into the capillary and venous phases. The innominate artery injection demonstrates wide patency of the right subclavian artery and the left common carotid artery. The right common carotid bifurcation demonstrates the right external carotid artery and its major branches to be widely patent. The right internal carotid artery at the bulb to the cranial skull base opacifies normally. The petrous, cavernous and the supraclinoid segments are widely patent. The right posterior communicating artery is seen opacifying the right posterior cerebral artery distribution. The right M1 segment is widely patent. The inferior division of the right middle cerebral artery demonstrates mild atherosclerotic irregularity in the M2 M3 region. The superior division demonstrates approximately 80% stenosss of the proximal portion of the superior division. Distal to this the M2 M3 regions of the superior division opacify into the capillary and venous phases. The right anterior cerebral A1 segment and distally demonstrates wide patency. The right vertebral artery origin is while widely patent. The vessel is seen to opacify normally to the cranial skull base. Normal opacification is seen of the right vertebrobasilar junction and the right posterior inferior cerebellar artery. The opacified portion of the basilar artery, the posterior cerebral artery, the superior cerebellar arteries and the anterior inferior cerebellar arteries is normal into the capillary and venous phases. The left common carotid arteriogram demonstrates the left  external carotid artery and its major branches to be widely patent. The left internal carotid artery at the bulb demonstrates a shallow smooth plaque along the posterior wall without significant stenosis by the NASCET criteria. The vessel distal to this is seen to opacify normally to the cranial skull base. The petrous, cavernous and the  supraclinoid segments demonstrate wide patency. The left middle cerebral artery has mild caliber irregularity in the left M1 segment. The trifurcation branches, otherwise, opacify normally into the capillary and the venous phases. The left anterior cerebral artery opacifies into the capillary and venous phases. Mild caliber irregularity involving the pericallosal artery at the level of the body of the corpus callosum probably represents mild intracranial arteriosclerosis. Flash filling of the right anterior cerebral artery A2 segment is seen via the anterior communicating artery. The arteriogram demonstrates wide patency of the innominate artery, and the left common carotid artery. Again demonstrated is the high-grade stenosis of the left subclavian artery proximally. IMPRESSION: Approximately 85% stenosis of the left subclavian artery at its origin and just proximal to this. Slow antegrade ascent of contrast is seen into the left vertebral artery. Approximately 50% stenosis of the left vertebral artery at the level of C1. Approximately 80% stenosis of the proximal superior division of the right middle cerebral artery. PLAN: Continue with medical management at this time. Consultation for endovascular revascularization of high-grade stenosis of the left subclavian artery proximally. Electronically Signed   By: Luanne Bras M.D.   On: 05/13/2017 15:00    Discharge Exam: Vitals:   06/05/17 1000 06/05/17 1414  BP: 105/64 105/62  Pulse: 77 74  Resp: 18 18  Temp: 98.4 F (36.9 C) 98.1 F (36.7 C)  SpO2: 93% 97%   Vitals:   06/05/17 0315 06/05/17 0330 06/05/17 1000 06/05/17 1414  BP: (!) 141/83 140/80 105/64 105/62  Pulse:   77 74  Resp:   18 18  Temp:   98.4 F (36.9 C) 98.1 F (36.7 C)  TempSrc:   Oral Oral  SpO2:   93% 97%  Weight:      Height:        General: NAD Ears: Left ear with bandage clean and intact, laceration looks clean and well approximated Cardiovascular: RRR, S1/S2 +,  no rubs, no gallops Respiratory: CTA bilaterally, no wheezing, no rhonchi Abdominal: Soft, NT, ND, bowel sounds + Neurology: Nonfocal Extremities: no edema,   The results of significant diagnostics from this hospitalization (including imaging, microbiology, ancillary and laboratory) are listed below for reference.     Microbiology: No results found for this or any previous visit (from the past 240 hour(s)).   Labs: BNP (last 3 results) No results for input(s): BNP in the last 8760 hours. Basic Metabolic Panel:  Recent Labs Lab 06/04/17 1638 06/04/17 1654  NA 137 140  K 4.1 4.0  CL 104 104  CO2 24  --   GLUCOSE 193* 198*  BUN 14 16  CREATININE 1.02* 0.90  CALCIUM 9.9  --    Liver Function Tests:  Recent Labs Lab 06/04/17 1638  AST 37  ALT 43  ALKPHOS 52  BILITOT 0.5  PROT 7.5  ALBUMIN 3.9   No results for input(s): LIPASE, AMYLASE in the last 168 hours. No results for input(s): AMMONIA in the last 168 hours. CBC:  Recent Labs Lab 06/04/17 1638 06/04/17 1654  WBC 10.1  --   NEUTROABS 8.5*  --   HGB 15.3* 16.3*  HCT 46.2* 48.0*  MCV 102.9*  --  PLT 157  --    Cardiac Enzymes: No results for input(s): CKTOTAL, CKMB, CKMBINDEX, TROPONINI in the last 168 hours. BNP: Invalid input(s): POCBNP CBG: No results for input(s): GLUCAP in the last 168 hours. D-Dimer No results for input(s): DDIMER in the last 72 hours. Hgb A1c No results for input(s): HGBA1C in the last 72 hours. Lipid Profile No results for input(s): CHOL, HDL, LDLCALC, TRIG, CHOLHDL, LDLDIRECT in the last 72 hours. Thyroid function studies No results for input(s): TSH, T4TOTAL, T3FREE, THYROIDAB in the last 72 hours.  Invalid input(s): FREET3 Anemia work up No results for input(s): VITAMINB12, FOLATE, FERRITIN, TIBC, IRON, RETICCTPCT in the last 72 hours. Urinalysis    Component Value Date/Time   COLORURINE YELLOW 03/20/2016 0806   APPEARANCEUR CLEAR 03/20/2016 0806   LABSPEC 1.018  03/20/2016 0806   PHURINE 6.0 03/20/2016 0806   GLUCOSEU NEGATIVE 03/20/2016 0806   HGBUR LARGE (A) 03/20/2016 0806   BILIRUBINUR NEGATIVE 03/20/2016 0806   KETONESUR NEGATIVE 03/20/2016 0806   PROTEINUR NEGATIVE 03/20/2016 0806   NITRITE NEGATIVE 03/20/2016 0806   LEUKOCYTESUR SMALL (A) 03/20/2016 0806   Sepsis Labs Invalid input(s): PROCALCITONIN,  WBC,  LACTICIDVEN Microbiology No results found for this or any previous visit (from the past 240 hour(s)).   Time coordinating discharge: 35 minutes  SIGNED:  Chipper Oman, MD  [facility] 06/05/2017, 6:42 PM  Pager please text page via  [email or web address] Password [email or web address]

## 2017-06-05 NOTE — ED Notes (Signed)
Bed assignment changed.  Attempted to call report however nurse was unavailable.  Will attempt again in 10 minutes.

## 2017-06-05 NOTE — Evaluation (Signed)
Physical Therapy Evaluation & Discharge Patient Details Name: Tracey Hartman MRN: [id number] DOB: 02-11-1930 Today's Date: 06/05/2017   History of Present Illness  Pt is an 83 y/o female admitted secondary to memory loss with suspected fall and LOC. MRI revealed acute subcentimeter R cerebral peduncle and thalamus infarcts as well as subcentimeter subacute hemorrhage of the L basal ganglia to internal capsule. PMH including but not limited to CAD, HTN and prior CVA.  Clinical Impression  Pt presented supine in bed with HOB elevated, awake and willing to participate in therapy session. Prior to admission, pt reported that she was independent with all functional mobility and ADLs. Pt lives alone but has a son that lives next door and has family that can provide 24/7 supervision/assistance if needed. Pt ambulated in hallway with close min guard to min A secondary to instability with challenges to dynamic balance. Pt participated in a higher level balance assessment and scored a 17/24 on the DGI, indicating that she is at an increased risk for falls. PT recommending pt f/u with Neuro OP PT services to work on her balance and coordination. No further acute PT needs identified at this time. PT signing off.     Follow Up Recommendations Outpatient PT;Supervision/Assistance - 24 hour;Other (comment) (initially)    Equipment Recommendations  None recommended by PT;Other (comment) (pt has RW, therapist encouraged pt to use it initially)    Recommendations for Other Services       Precautions / Restrictions Precautions Precautions: Fall Restrictions Weight Bearing Restrictions: No      Mobility  Bed Mobility Overal bed mobility: Modified Independent             General bed mobility comments: increased time and effort  Transfers Overall transfer level: Needs assistance Equipment used: None Transfers: Sit to/from Stand Sit to Stand: Supervision         General transfer comment: for  safety  Ambulation/Gait Ambulation/Gait assistance: Min guard;Min assist Ambulation Distance (Feet): 150 Feet Assistive device: None;1 person hand held assist Gait Pattern/deviations: Step-through pattern;Drifts right/left Gait velocity: WFL Gait velocity interpretation: at or above normal speed for age/gender General Gait Details: mild instability with ambulating on flat, even surfaces; with challanges to balance pt with modest instability requiring min A   Stairs            Wheelchair Mobility    Modified Rankin (Stroke Patients Only) Modified Rankin (Stroke Patients Only) Pre-Morbid Rankin Score: No symptoms Modified Rankin: Slight disability     Balance Overall balance assessment: Needs assistance;History of Falls Sitting-balance support: Feet supported Sitting balance-Leahy Scale: Normal Sitting balance - Comments: able to adjust socks sitting EOB with supervision   Standing balance support: During functional activity;No upper extremity supported Standing balance-Leahy Scale: Fair                   Standardized Balance Assessment Standardized Balance Assessment : Dynamic Gait Index   Dynamic Gait Index Level Surface: Normal Change in Gait Speed: Normal Gait with Horizontal Head Turns: Moderate Impairment Gait with Vertical Head Turns: Moderate Impairment Gait and Pivot Turn: Mild Impairment Step Over Obstacle: Mild Impairment Step Around Obstacles: Normal Steps: Mild Impairment Total Score: 17       Pertinent Vitals/Pain Pain Assessment: No/denies pain    Home Living Family/patient expects to be discharged to:: Private residence Living Arrangements: Alone Available Help at Discharge: Family;Friend(s);Available 24 hours/day Type of Home: House Home Access: Level entry     Home Layout: One level  Home Equipment: Clinical cytogeneticist - 2 wheels;Wheelchair - manual      Prior Function Level of Independence: Independent                Hand Dominance   Dominant Hand: Right    Extremity/Trunk Assessment   Upper Extremity Assessment Upper Extremity Assessment: Overall WFL for tasks assessed    Lower Extremity Assessment Lower Extremity Assessment: Overall WFL for tasks assessed       Communication   Communication: No difficulties  Cognition Arousal/Alertness: Awake/alert Behavior During Therapy: WFL for tasks assessed/performed Overall Cognitive Status: Impaired/Different from baseline Area of Impairment: Memory;Orientation;Safety/judgement                 Orientation Level: Disoriented to;Time   Memory: Decreased short-term memory   Safety/Judgement: Decreased awareness of deficits;Decreased awareness of safety            General Comments      Exercises     Assessment/Plan    PT Assessment All further PT needs can be met in the next venue of care  PT Problem List Decreased balance;Decreased mobility;Decreased coordination       PT Treatment Interventions      PT Goals (Current goals can be found in the Care Plan section)  Acute Rehab PT Goals Patient Stated Goal: return home ASAP    Frequency     Barriers to discharge        Co-evaluation               AM-PAC PT "6 Clicks" Daily Activity  Outcome Measure Difficulty turning over in bed (including adjusting bedclothes, sheets and blankets)?: None Difficulty moving from lying on back to sitting on the side of the bed? : None Difficulty sitting down on and standing up from a chair with arms (e.g., wheelchair, bedside commode, etc,.)?: None Help needed moving to and from a bed to chair (including a wheelchair)?: None Help needed walking in hospital room?: A Little Help needed climbing 3-5 steps with a railing? : A Little 6 Click Score: 22    End of Session   Activity Tolerance: Patient tolerated treatment well Patient left: in bed;with call bell/phone within reach;with family/visitor present Nurse Communication:  Mobility status PT Visit Diagnosis: Other abnormalities of gait and mobility (R26.89)    Time: 7092-9574 PT Time Calculation (min) (ACUTE ONLY): 23 min   Charges:   PT Evaluation $PT Eval Moderate Complexity: 1 Mod PT Treatments $Gait Training: 8-22 mins   PT G Codes:   PT G-Codes **NOT FOR INPATIENT CLASS** Functional Assessment Tool Used: AM-PAC 6 Clicks Basic Mobility;Clinical judgement Functional Limitation: Mobility: Walking and moving around Mobility: Walking and Moving Around Current Status (B3403): At least 20 percent but less than 40 percent impaired, limited or restricted Mobility: Walking and Moving Around Goal Status [phone]): 0 percent impaired, limited or restricted Mobility: Walking and Moving Around Discharge Status [phone]): At least 20 percent but less than 40 percent impaired, limited or restricted    [facility], [place], DPT [place] 06/05/2017, 5:05 PM

## 2017-06-05 NOTE — Progress Notes (Signed)
STROKE TEAM PROGRESS NOTE   HISTORY OF PRESENT ILLNESS (per record) Tracey Hartman is a 83 y.o. 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  She states that the last thing that she remembers was counting pills with her son.  Her memory abruptly stops there and subsequently she remembers being in the bathroom try to watch blood out of her hair.  According to her son, he left after they were sent is counting pills that she was still in her normal state of health, other than complaining of some vertical diplopia.  Another family member then got a phone call from her and she appeared to be confused and therefore he called the same son who is been with her earlier and he went to her house.  She unlocked the door and Tracey Hartman, and he found her to have a cut ear and evidence that she had fallen.  She does not know the position of her arm or other specifics around when she fell given that she has a period of amnesia prior to the fall.  Though she has had some memory issues, she has not had a lapse in time such as this before.  LKW: 11/1 given that she was having diplopia this morning. tpa given?: no, outside of window   SUBJECTIVE (INTERVAL HISTORY) Her sons are at the bedside.  Patient lying in bed, no complaints.  Patient cannot remember the episode.  However, there is evidence of falling.  Speculated that she had dizziness, vertigo due to stroke, fell, hitting her head, causing concussion with retrograde amnesia.  MRI showed right cerebral peduncle and right thalamus punctate infarcts.  Patient has scheduled next Tuesday for subclavian stent.  Family also complains of intermittent facial droop with resolution.   OBJECTIVE Temp:  [97.7 F (36.5 C)-98.4 F (36.9 C)] 98.4 F (36.9 C) (11/03 1000) Pulse Rate:  [59-84] 77 (11/03 1000) Cardiac Rhythm: Normal sinus rhythm (11/03 0858) Resp:  [14-22] 18 (11/03  1000) BP: (105-166)/(64-110) 105/64 (11/03 1000) SpO2:  [93 %-100 %] 93 % (11/03 1000) Weight:  [138 lb (62.6 kg)-139 lb 3.2 oz (63.1 kg)] 139 lb 3.2 oz (63.1 kg) (11/03 0300)  CBC:   Recent Labs Lab 06/04/17 1638 06/04/17 1654  WBC 10.1  --   NEUTROABS 8.5*  --   HGB 15.3* 16.3*  HCT 46.2* 48.0*  MCV 102.9*  --   PLT 157  --     Basic Metabolic Panel:   Recent Labs Lab 06/04/17 1638 06/04/17 1654  NA 137 140  K 4.1 4.0  CL 104 104  CO2 24  --   GLUCOSE 193* 198*  BUN 14 16  CREATININE 1.02* 0.90  CALCIUM 9.9  --     Lipid Panel: No results found for: CHOL, TRIG, HDL, CHOLHDL, VLDL, LDLCALC HgbA1c: No results found for: HGBA1C Urine Drug Screen: No results found for: LABOPIA, COCAINSCRNUR, LABBENZ, AMPHETMU, THCU, LABBARB  Alcohol Level No results found for: [place] I have personally reviewed the radiological images below and agree with the radiology interpretations.  Ct Head Wo Contrast 06/04/2017 IMPRESSION:  1. No acute intracranial abnormalities.  2. Age related volume loss, moderate to advanced chronic microvascular ischemic change and several old lacune infarcts, stable from the prior CT.   Mr Brain Wo Contrast 06/05/2017 IMPRESSION:  1. Acute subcentimeter RIGHT cerebral peduncle and thalamus infarcts.  2. Subcentimeter subacute hemorrhage LEFT basal ganglia  to internal capsule.  3. Severe chronic small vessel ischemic disease. Old small RIGHT cerebellar infarct.  4. Numerous chronic micro hemorrhages seen with amyloid angiopathy and, severe chronic hypertension.   IR Angiogram Dr Estanislado Pandy 05/13/2017 Approximately 85% stenosis of the left subclavian artery at its origin and just proximal to this. Slow antegrade ascent of contrast is seen into the left vertebral artery. Approximately 50% stenosis of the left vertebral artery at the level of C1. Approximately 80% stenosis of the proximal superior division of the right middle cerebral  artery.  EEG - normal EEG  Transthoracic Echocardiogram 03/25/2017 Study Conclusions - Left ventricle: The cavity size was normal. Wall thickness was   normal. Systolic function was normal. The estimated ejection   fraction was in the range of 55% to 60%. Wall motion was normal;   there were no regional wall motion abnormalities. Doppler   parameters are consistent with abnormal left ventricular   relaxation (grade 1 diastolic dysfunction). The E/e&' ratio is   between 8-15, suggesting indeterminate LV filling pressure. - Aortic valve: Trileaflet. Sclerosis without stenosis. There was   no regurgitation. - Mitral valve: Mildly thickened leaflets . There was trivial   regurgitation. - Left atrium: The atrium was normal in size. - Right ventricle: The cavity size was normal. Wall thickness was   normal. Systolic function was low normal. - Inferior vena cava: The vessel was dilated. The respirophasic   diameter changes were blunted (< 50%), consistent with elevated   central venous pressure. Impressions: - Compared to a prior study in 2015, the LVEF is slightly lower,   but normal at 55-60%.    PHYSICAL EXAM Vitals:   06/05/17 0300 06/05/17 0315 06/05/17 0330 06/05/17 1000  BP: (!) 153/72 (!) 141/83 140/80 105/64  Pulse: 64   77  Resp: 20   18  Temp: 97.7 F (36.5 C)   98.4 F (36.9 C)  TempSrc: Oral   Oral  SpO2: 94%   93%  Weight: 139 lb 3.2 oz (63.1 kg)     Height: 5\' 3"  (1.6 m)       Temp:  [97.7 F (36.5 C)-98.4 F (36.9 C)] 98.1 F (36.7 C) (11/03 1414) Pulse Rate:  [59-84] 74 (11/03 1414) Resp:  [14-22] 18 (11/03 1414) BP: (105-166)/(62-110) 105/62 (11/03 1414) SpO2:  [93 %-100 %] 97 % (11/03 1414) Weight:  [138 lb (62.6 kg)-139 lb 3.2 oz (63.1 kg)] 139 lb 3.2 oz (63.1 kg) (11/03 0300)  General - Well nourished, well developed, in no apparent distress.  Ophthalmologic - Sharp disc margins OU.   Cardiovascular - Regular rate and rhythm.  Mental Status -   Level of arousal and orientation to time, place, and person were intact. Language including expression, naming, repetition, comprehension was assessed and found intact. Fund of Knowledge was assessed and was intact.  Cranial Nerves II - XII - II - Visual field intact OU. III, IV, VI - Extraocular movements intact. V - Facial sensation intact bilaterally. VII - Facial movement intact bilaterally. VIII - Hearing & vestibular intact bilaterally. X - Palate elevates symmetrically. XI - Chin turning & shoulder shrug intact bilaterally. XII - Tongue protrusion intact.  Motor Strength - The patient's strength was normal in all extremities and pronator drift was absent.  Bulk was normal and fasciculations were absent.   Motor Tone - Muscle tone was assessed at the neck and appendages and was normal.  Reflexes - The patient's reflexes were 1+ in all extremities and she had  no pathological reflexes.  Sensory - Light touch, temperature/pinprick were assessed and were symmetrical.    Coordination - The patient had normal movements in the hands and feet with no ataxia or dysmetria.  Tremor was absent.  Gait and Station -deferred   ASSESSMENT/PLAN Tracey Hartman is a 83 y.o. female with history of previous stroke, hypertension, possible subclavian steal syndrome and coronary artery disease presenting with loss of consciousness, confusion, and amnesia regarding the event. She did not receive IV t-PA due to late presentation.  Stroke: Acute subcentimeter RIGHT cerebral peduncle and thalamus infarcts could be due to left subclavian steal.  Resultant deficit resolved  CT head - No acute intracranial abnormalities.   MRI head - Acute subcentimeter RIGHT cerebral peduncle and thalamus infarcts.     Cerebral angioma 05/13/17 - 85% left subclavian stenosis, 80% right M2 stenosis                              2D Echo - 03/25/2017  - EF 55-60%. No cardiac source of emboli identified.  LDL -  pending  HgbA1c - pending  VTE prophylaxis -  - Lovenox Diet Heart Room service appropriate? Yes; Fluid consistency: Thin  aspirin 81 mg daily and clopidogrel 75 mg daily prior to admission, now on aspirin 81 mg daily and clopidogrel 75 mg daily.   Patient counseled to be compliant with her antithrombotic medications  Ongoing aggressive stroke risk factor management  Therapy recommendations: - pending  Disposition: Pending  Left subclavian steal  CTA head and neck 12/1023 - brachiocephalic and left subclavian high-grade stenosis  Cerebral angiogram 05/2017 - 85% left subclavian stenosis, 80% right M2 stenosis.  However, left VA still antegrade flow at that time  Current posterior circulation infarct may be related to left subclavian steal with ?  Left arm exertion.  Left subclavian stent likely next Tuesday by Dr. Estanislado Pandy  History of stroke  11/2013 - right hand weakness, tingling, gait imbalance - on aspirin and statin - MRI 01/2014 no acute stroke - A1c 6.3 LDL 94  01/2017 - memory decline  03/2017 - MRI brain showed left CR, right occipital horn VM periventricular punctate infarct  Hypertension /hypotension  Stable, on the low side  Avoid hypotension, currently BP meds on hold  Long-term BP goal normotensive  Hyperlipidemia  Home meds: No lipid lowering medications prior to admission  LDL pending, goal < 70  Diabetes  HgbA1c pending, goal < 7.0  Unc / Controlled  Other Stroke Risk Factors  Advanced age  Former cigarette smoker - quit  CAD  Other Active Problems  Sutured laceration Lt earlobe.  Left elbow ecchymosis  Hospital day # 0  I spent  35 minutes in total face-to-face time with the patient, more than 50% of which was spent in counseling and coordination of care, reviewing test results, images and medication, and discussing the diagnosis of left subclavian steal syndrome, treatment plan and potential prognosis. This patient's care  requiresreview of multiple databases, neurological assessment, discussion with family, other specialists and medical decision making of high complexity. I had long discussion with sons at bedside, updated pt current condition, treatment plan and potential prognosis. They expressed understanding and appreciation.    Rosalin Hawking, MD PhD Stroke Neurology 06/05/2017 4:35 PM  To contact Stroke Continuity provider, please refer to [email or web address] After hours, contact General Neurology

## 2017-06-05 NOTE — Progress Notes (Signed)
Bedside EEG completed, results pending. 

## 2017-06-05 NOTE — Progress Notes (Signed)
Late entry for 06/05/17 @ 0319 Patient has purple ecccymotic are on R groin area, purple In color. Patient still denies pain. Will continue to monitor. Safety maintained.

## 2017-06-08 ENCOUNTER — Ambulatory Visit (HOSPITAL_COMMUNITY)
Admission: RE | Admit: 2017-06-08 | Discharge: 2017-06-08 | Disposition: A | Discharge status: Home / Self Care | Payer: Medicare Other | Source: Ambulatory Visit | Attending: Interventional Radiology | Admitting: Interventional Radiology

## 2017-06-08 DIAGNOSIS — I771 Stricture of artery: Secondary | ICD-10-CM

## 2017-06-08 DIAGNOSIS — G450 Vertebro-basilar artery syndrome: Secondary | ICD-10-CM | POA: Diagnosis not present

## 2017-06-08 HISTORY — PX: IR RADIOLOGIST EVAL & MGMT: IMG5224

## 2017-06-09 ENCOUNTER — Other Ambulatory Visit (HOSPITAL_COMMUNITY): Payer: Self-pay | Admitting: Interventional Radiology

## 2017-06-09 ENCOUNTER — Encounter (HOSPITAL_COMMUNITY): Payer: Self-pay | Admitting: Interventional Radiology

## 2017-06-09 DIAGNOSIS — I771 Stricture of artery: Secondary | ICD-10-CM

## 2017-06-11 DIAGNOSIS — R8290 Unspecified abnormal findings in urine: Secondary | ICD-10-CM | POA: Diagnosis not present

## 2017-06-14 ENCOUNTER — Other Ambulatory Visit: Payer: Self-pay | Admitting: Radiology

## 2017-06-14 LAB — PLATELET INHIBITION P2Y12: Platelet Function  P2Y12: 130 [PRU] — ABNORMAL LOW (ref 194–418)

## 2017-06-15 ENCOUNTER — Other Ambulatory Visit: Payer: Self-pay | Admitting: Student

## 2017-06-15 ENCOUNTER — Encounter (HOSPITAL_COMMUNITY): Payer: Self-pay | Admitting: *Deleted

## 2017-06-15 ENCOUNTER — Other Ambulatory Visit: Payer: Self-pay | Admitting: Radiology

## 2017-06-15 ENCOUNTER — Other Ambulatory Visit: Payer: Self-pay | Admitting: General Surgery

## 2017-06-15 NOTE — Progress Notes (Signed)
Anesthesia Chart Review: SAME DAY WORK-UP.  Patient is a 83 year old female posted for angioplasty with stenting 06/16/17 by Dr. Luanne Bras.  (Stent assisted angioplasty of a severe left subclavian artery stenosis.)   History includes former smoker, IBS, osteoporosis, recurrent UTI, HTN, vertebobasilar insufficiency, CVA 06/04/17 ("Felt to be secondary to subclavian steal syndrome, posterior circulation insufficient, resulting in stroke and syncopal episode", CAD (angioplasty 1986, last saw Dr. Bing Quarry 08/25/10 with low risk stress test).  - Admission 06/04/17 -06/05/17 for syncope/vertical diplopia with MRI showed right cerebral peduncle and thalamus infarcts. EEG was negative. IR performed cerebral a-gram with results as below. CVA felt to be secondary to subclavian steal syndrome, posterior circulation insufficient. Out-patient left SCA stent planned by IR. Also seen by neurologist Dr. Rosalin Hawking who was aware that procedure was planned.    PCP is Dr. Mayra Neer.  Meds include ASA 81 mg, Lipitor, Plavix, Lopressor, fish oil, trimethoprim (chronic for UTI prevention).  EKG 06/04/17: SR, non-specific T wave abnormality. Artifact.   Echo 03/25/17: Study Conclusions: - Left ventricle: The cavity size was normal. Wall thickness was   normal. Systolic function was normal. The estimated ejection   fraction was in the range of 55% to 60%. Wall motion was normal;   there were no regional wall motion abnormalities. Doppler   parameters are consistent with abnormal left ventricular   relaxation (grade 1 diastolic dysfunction). The E/e&' ratio is   between 8-15, suggesting indeterminate LV filling pressure. - Aortic valve: Trileaflet. Sclerosis without stenosis. There was   no regurgitation. - Mitral valve: Mildly thickened leaflets . There was trivial   regurgitation. - Left atrium: The atrium was normal in size. - Right ventricle: The cavity size was normal. Wall thickness was    normal. Systolic function was low normal. - Inferior vena cava: The vessel was dilated. The respirophasic   diameter changes were blunted (< 50%), consistent with elevated   central venous pressure. Impressions: - Compared to a prior study in 2015, the LVEF is slightly lower,   but normal at 55-60%.  Nuclear stress test 09/03/10: Overall Impression: Small apical lateral infarct with no ischemia. EF 72%. Findings: Low risk nuclear study. Evidence for anterior (septal apical) infarct.  CTA head/neck 04/20/17: IMPRESSION: 1. Calcified Aortic Atherosclerosis (ICD10-I70.0) with bulky calcified plaque resulting in High-grade proximal great vessel stenoses of both the Brachiocephalic and Left Subclavian Arteries. 2. But mild for age atherosclerosis elsewhere. No hemodynamically significant carotid or vertebral artery stenosis. 3. Mild generalized intracranial artery ectasia with no intracranial stenosis or branch occlusion. 4. 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 5. No acute intracranial abnormality. Progressed chronic small vessel disease in the cerebral white matter and right cerebellum since the prior 2011 head CT. 6. No acute findings in the neck or upper chest.  Bilateral CCA and innominate/vertebral/subclavian angiography 05/13/17: IMPRESSION: - Approximately 85% stenosis of the left subclavian artery at its origin and just proximal to this. - Slow antegrade ascent of contrast is seen into the left vertebral artery. - Approximately 50% stenosis of the left vertebral artery at the level of C1. - Approximately 80% stenosis of the proximal superior division of the right middle cerebral artery. PLAN: Continue with medical management at this time. Consultation for endovascular revascularization of high-grade stenosis of the left subclavian artery proximally.  Brain MRI 06/05/17: IMPRESSION: 1. Acute subcentimeter RIGHT cerebral peduncle and  thalamus infarcts. 2. Subcentimeter subacute hemorrhage LEFT basal ganglia to internal capsule. 3.  Severe chronic small vessel ischemic disease. Old small RIGHT cerebellar infarct. 4. Numerous chronic micro hemorrhages seen with amyloid angiopathy and, severe chronic hypertension.  Labs from 06/04/17 noted. p2y12 on 06/14/17 was 130. Additional labs scheduled for the day of her procedure per IR.  Anesthesiologist to evaluate on the day of surgery.  George Hugh [facility] Short Stay Center/Anesthesiology Phone [phone] 06/15/2017 5:36 PM

## 2017-06-15 NOTE — Progress Notes (Signed)
Denies chest pain or shortness of breath. [facility] is PCP. Reports that she does not have a current cardiologist. Spoke to Dr. Linna Caprice regarding cardiac history advised that it would be reviewed by assigned Anesthesiologist day of surgery.

## 2017-06-16 ENCOUNTER — Ambulatory Visit (HOSPITAL_COMMUNITY)
Admission: RE | Admit: 2017-06-16 | Discharge: 2017-06-16 | Disposition: A | Discharge status: Home / Self Care | Payer: Medicare Other | Source: Ambulatory Visit | Attending: Interventional Radiology | Admitting: Interventional Radiology

## 2017-06-16 ENCOUNTER — Encounter (HOSPITAL_COMMUNITY): Payer: Self-pay | Admitting: Certified Registered"

## 2017-06-16 ENCOUNTER — Ambulatory Visit (HOSPITAL_COMMUNITY): Payer: Medicare Other | Admitting: Certified Registered"

## 2017-06-16 ENCOUNTER — Encounter (HOSPITAL_COMMUNITY)
Admission: RE | Disposition: A | Discharge status: Home / Self Care | Payer: Self-pay | Source: Ambulatory Visit | Attending: Interventional Radiology

## 2017-06-16 DIAGNOSIS — I10 Essential (primary) hypertension: Secondary | ICD-10-CM | POA: Insufficient documentation

## 2017-06-16 DIAGNOSIS — Z7902 Long term (current) use of antithrombotics/antiplatelets: Secondary | ICD-10-CM | POA: Diagnosis not present

## 2017-06-16 DIAGNOSIS — G450 Vertebro-basilar artery syndrome: Secondary | ICD-10-CM | POA: Insufficient documentation

## 2017-06-16 DIAGNOSIS — M810 Age-related osteoporosis without current pathological fracture: Secondary | ICD-10-CM | POA: Insufficient documentation

## 2017-06-16 DIAGNOSIS — Z87891 Personal history of nicotine dependence: Secondary | ICD-10-CM | POA: Diagnosis not present

## 2017-06-16 DIAGNOSIS — Z7982 Long term (current) use of aspirin: Secondary | ICD-10-CM | POA: Diagnosis not present

## 2017-06-16 DIAGNOSIS — I252 Old myocardial infarction: Secondary | ICD-10-CM | POA: Diagnosis not present

## 2017-06-16 DIAGNOSIS — Z8673 Personal history of transient ischemic attack (TIA), and cerebral infarction without residual deficits: Secondary | ICD-10-CM | POA: Diagnosis not present

## 2017-06-16 DIAGNOSIS — I2510 Atherosclerotic heart disease of native coronary artery without angina pectoris: Secondary | ICD-10-CM | POA: Insufficient documentation

## 2017-06-16 DIAGNOSIS — Z7901 Long term (current) use of anticoagulants: Secondary | ICD-10-CM | POA: Diagnosis not present

## 2017-06-16 DIAGNOSIS — I771 Stricture of artery: Secondary | ICD-10-CM

## 2017-06-16 DIAGNOSIS — Z87440 Personal history of urinary (tract) infections: Secondary | ICD-10-CM | POA: Diagnosis not present

## 2017-06-16 DIAGNOSIS — I658 Occlusion and stenosis of other precerebral arteries: Secondary | ICD-10-CM | POA: Diagnosis not present

## 2017-06-16 HISTORY — DX: Acute myocardial infarction, unspecified: I21.9

## 2017-06-16 HISTORY — PX: RADIOLOGY WITH ANESTHESIA: SHX6223

## 2017-06-16 HISTORY — PX: IR ANGIO VERTEBRAL SEL SUBCLAVIAN INNOMINATE UNI L MOD SED: IMG5364

## 2017-06-16 LAB — CBC WITH DIFFERENTIAL/PLATELET
BASOS ABS: 0 10*3/uL (ref 0.0–0.1)
BASOS PCT: 1 %
EOS PCT: 4 %
Eosinophils Absolute: 0.4 10*3/uL (ref 0.0–0.7)
HEMATOCRIT: 43 % (ref 36.0–46.0)
Hemoglobin: 14.2 g/dL (ref 12.0–15.0)
LYMPHS PCT: 28 %
Lymphs Abs: 2.3 10*3/uL (ref 0.7–4.0)
MCH: 34.1 pg — ABNORMAL HIGH (ref 26.0–34.0)
MCHC: 33 g/dL (ref 30.0–36.0)
MCV: 103.1 fL — AB (ref 78.0–100.0)
MONO ABS: 0.6 10*3/uL (ref 0.1–1.0)
Monocytes Relative: 7 %
NEUTROS ABS: 5.1 10*3/uL (ref 1.7–7.7)
Neutrophils Relative %: 60 %
PLATELETS: 176 10*3/uL (ref 150–400)
RBC: 4.17 MIL/uL (ref 3.87–5.11)
RDW: 12.4 % (ref 11.5–15.5)
WBC: 8.4 10*3/uL (ref 4.0–10.5)

## 2017-06-16 LAB — COMPREHENSIVE METABOLIC PANEL
ALBUMIN: 3.4 g/dL — AB (ref 3.5–5.0)
ALT: 39 U/L (ref 14–54)
AST: 30 U/L (ref 15–41)
Alkaline Phosphatase: 49 U/L (ref 38–126)
Anion gap: 8 (ref 5–15)
BUN: 21 mg/dL — AB (ref 6–20)
CHLORIDE: 107 mmol/L (ref 101–111)
CO2: 24 mmol/L (ref 22–32)
CREATININE: 1.06 mg/dL — AB (ref 0.44–1.00)
Calcium: 9 mg/dL (ref 8.9–10.3)
GFR calc Af Amer: 54 mL/min — ABNORMAL LOW (ref >60)
GFR, EST NON AFRICAN AMERICAN: 47 mL/min — AB (ref >60)
GLUCOSE: 118 mg/dL — AB (ref 65–99)
POTASSIUM: 4.3 mmol/L (ref 3.5–5.1)
Sodium: 139 mmol/L (ref 135–145)
Total Bilirubin: 0.7 mg/dL (ref 0.3–1.2)
Total Protein: 6.7 g/dL (ref 6.5–8.1)

## 2017-06-16 LAB — APTT: APTT: 32 s (ref 24–36)

## 2017-06-16 LAB — PROTIME-INR
INR: 0.92
Prothrombin Time: 12.2 seconds (ref 11.4–15.2)

## 2017-06-16 LAB — POCT ACTIVATED CLOTTING TIME: Activated Clotting Time: 136 seconds

## 2017-06-16 LAB — PLATELET INHIBITION P2Y12: Platelet Function  P2Y12: 162 [PRU] — ABNORMAL LOW (ref 194–418)

## 2017-06-16 SURGERY — RADIOLOGY WITH ANESTHESIA
Anesthesia: Monitor Anesthesia Care

## 2017-06-16 MED ORDER — CLOPIDOGREL BISULFATE 75 MG PO TABS
75.0000 mg | ORAL_TABLET | ORAL | Status: AC
  Administered 2017-06-16: 75 mg via ORAL
  Filled 2017-06-16: qty 1

## 2017-06-16 MED ORDER — LIDOCAINE HCL (PF) 1 % IJ SOLN
INTRAMUSCULAR | Status: DC | PRN
  Administered 2017-06-16: 20 mL

## 2017-06-16 MED ORDER — LACTATED RINGERS IV SOLN: INTRAVENOUS | Status: DC

## 2017-06-16 MED ORDER — NIMODIPINE 30 MG PO CAPS
0.0000 mg | ORAL_CAPSULE | ORAL | Status: DC
  Filled 2017-06-16: qty 2

## 2017-06-16 MED ORDER — IOPAMIDOL (ISOVUE-300) INJECTION 61%
INTRAVENOUS | Status: AC
  Filled 2017-06-16: qty 150

## 2017-06-16 MED ORDER — ASPIRIN 81 MG PO CHEW
CHEWABLE_TABLET | ORAL | Status: AC
  Administered 2017-06-16: 243 mg
  Filled 2017-06-16: qty 3

## 2017-06-16 MED ORDER — LIDOCAINE HCL 1 % IJ SOLN
INTRAMUSCULAR | Status: AC
  Filled 2017-06-16: qty 20

## 2017-06-16 MED ORDER — LACTATED RINGERS IV SOLN
INTRAVENOUS | Status: DC | PRN
  Administered 2017-06-16: 10:00:00 via INTRAVENOUS

## 2017-06-16 MED ORDER — FENTANYL CITRATE (PF) 100 MCG/2ML IJ SOLN
INTRAMUSCULAR | Status: DC | PRN
  Administered 2017-06-16 (×2): 25 ug via INTRAVENOUS

## 2017-06-16 MED ORDER — ASPIRIN EC 325 MG PO TBEC
325.0000 mg | DELAYED_RELEASE_TABLET | ORAL | Status: DC
  Filled 2017-06-16: qty 1

## 2017-06-16 MED ORDER — CLOPIDOGREL BISULFATE 75 MG PO TABS: 75.0000 mg | ORAL_TABLET | Freq: Once | ORAL | Status: DC

## 2017-06-16 MED ORDER — HEPARIN SODIUM (PORCINE) 1000 UNIT/ML IJ SOLN
INTRAMUSCULAR | Status: DC | PRN
  Administered 2017-06-16: 3000 [IU] via INTRAVENOUS

## 2017-06-16 MED ORDER — NITROGLYCERIN 1 MG/10 ML FOR IR/CATH LAB
INTRA_ARTERIAL | Status: AC
  Filled 2017-06-16: qty 10

## 2017-06-16 MED ORDER — MIDAZOLAM HCL 5 MG/5ML IJ SOLN
INTRAMUSCULAR | Status: DC | PRN
  Administered 2017-06-16: 0.5 mg via INTRAVENOUS

## 2017-06-16 MED ORDER — CEFAZOLIN SODIUM-DEXTROSE 2-4 GM/100ML-% IV SOLN
2.0000 g | INTRAVENOUS | Status: AC
  Administered 2017-06-16: 2 g via INTRAVENOUS
  Filled 2017-06-16: qty 100

## 2017-06-16 MED ORDER — IOPAMIDOL (ISOVUE-300) INJECTION 61%: 35.0000 mL | Freq: Once | INTRAVENOUS | Status: DC | PRN

## 2017-06-16 MED ORDER — SODIUM CHLORIDE 0.9 % IV SOLN: INTRAVENOUS | Status: DC

## 2017-06-16 NOTE — H&P (Signed)
Chief Complaint: Patient was seen in consultation today for stenosis  Supervising Physician: Luanne Bras  Patient Status: [facility] - Out-pt  History of Present Illness: Tracey Hartman is a 83 y.o. female with past medical history of CAD, HTN, MI s/p cardiac cath, and CVA on Elliquis who presents for possible angioplasty and stent.   Patient underwent angiogram 05/13/17 with Dr. Estanislado Pandy which showed: Approximately 85% stenosis of the left subclavian artery at its origin and just proximal to this.  Slow antegrade ascent of contrast is seen into the left vertebral artery. Approximately 50% stenosis of the left vertebral artery at the level of C1. Approximately 80% stenosis of the proximal superior division of the right middle cerebral artery.  Patient presents to radiology department today for intervention of her high-grade stenosis.  She has been in her usual state of health.  She has taken her Plavix regularly at home.  She is NPO.  Past Medical History:  Diagnosis Date  . Coronary artery disease   . Hypertension   . Myocardial infarction ([place]) 1986  . Osteoporosis   . Recurrent UTI   . Stroke [facility])     Past Surgical History:  Procedure Laterality Date  . APPENDECTOMY    . CATARACT EXTRACTION Bilateral   . COLONOSCOPY    . IR ANGIO INTRA EXTRACRAN SEL COM CAROTID INNOMINATE BILAT MOD SED  05/13/2017  . IR ANGIO VERTEBRAL SEL SUBCLAVIAN INNOMINATE UNI R MOD SED  05/13/2017  . IR ANGIO VERTEBRAL SEL VERTEBRAL UNI L MOD SED  05/13/2017  . IR RADIOLOGIST EVAL & MGMT  06/08/2017    Allergies: Codeine  Medications: Prior to Admission medications   Medication Sig Start Date End Date Taking? Authorizing Provider  aspirin EC 81 MG tablet Take 1 tablet (81 mg total) by mouth daily. 06/03/14   Philmore Pali, NP  atorvastatin (LIPITOR) 40 MG tablet Take 1 tablet (40 mg total) by mouth daily. 06/05/17 06/05/18  Doreatha Lew, MD  Biotin 10 MG TABS Take 1 tablet by  mouth daily.    [provider]  Calcium Carbonate-Vitamin D 600-400 MG-UNIT tablet Take 2 tablets by mouth daily with breakfast.     [provider]  clopidogrel (PLAVIX) 75 MG tablet Take 1 tablet (75 mg total) by mouth daily. 04/28/17   Garvin Fila, MD  Cranberry 1000 MG CAPS Take 1,000 mg by mouth daily.    [provider]  GARLIC PO Take 1 capsule by mouth daily.    [provider]  metoprolol tartrate (LOPRESSOR) 50 MG tablet Take 25 mg daily by mouth.    [provider]  Multiple Vitamins-Minerals (MULTIVITAMIN WITH MINERALS) tablet Take 1 tablet by mouth daily.    [provider]  Omega-3 Fatty Acids (FISH OIL) 1200 MG CAPS Take 2 capsules by mouth daily.     [provider]  trimethoprim (TRIMPEX) 100 MG tablet Take 100 mg by mouth daily.     [provider]     Family History  Problem Relation Age of Onset  . Kidney failure Mother        renal failure  . Heart attack Father     Social History   Socioeconomic History  . Marital status: Married    Spouse name: Not on file  . Number of children: 4  . Years of education: 12th  . Highest education level: Not on file  Social Needs  . Financial resource strain: Not on file  .  Food insecurity - worry: Not on file  . Food insecurity - inability: Not on file  . Transportation needs - medical: Not on file  . Transportation needs - non-medical: Not on file  Occupational History  . Occupation: Therapist, music: [employer]  Tobacco Use  . Smoking status: Former Research scientist (life sciences)  . Smokeless tobacco: Never Used  Substance and Sexual Activity  . Alcohol use: No  . Drug use: No  . Sexual activity: No  Other Topics Concern  . Not on file  Social History Narrative   Patient lives at home alone    Patient is right handed   Patient drinks coffee daily    Review of Systems  Constitutional: Negative for fatigue and fever.  Respiratory: Negative for cough  and shortness of breath.   Cardiovascular: Negative for chest pain.  Gastrointestinal: Negative for abdominal pain.  Psychiatric/Behavioral: Negative for behavioral problems and confusion.    Vital Signs: BP 102/82 (BP Location: Right Arm)   Physical Exam  Constitutional: She is oriented to person, place, and time. She appears well-developed.  Cardiovascular: Normal rate, regular rhythm, normal heart sounds and intact distal pulses.  Distal pulses 2+ bilaterally.  Pulmonary/Chest: Effort normal and breath sounds normal. No respiratory distress.  Abdominal: Soft.  Neurological: She is alert and oriented to person, place, and time.  No focal deficits. Strength 5/5 in bilateral upper and lower extremities.   Psychiatric: She has a normal mood and affect. Her behavior is normal. Judgment and thought content normal.  Nursing note and vitals reviewed.   Imaging: Dg Elbow Complete Left  Result Date: 06/04/2017 CLINICAL DATA:  Acute left elbow pain following fall today. Initial encounter. EXAM: LEFT ELBOW - COMPLETE 3+ VIEW COMPARISON:  None. FINDINGS: There is no evidence of acute fracture, subluxation or dislocation. There is no evidence of joint effusion. Posterior soft tissue swelling is noted. IMPRESSION: Posterior soft tissue swelling without acute bony abnormality. Electronically Signed   By: Margarette Canada M.D.   On: 06/04/2017 16:57   Ct Head Wo Contrast  Result Date: 06/04/2017 CLINICAL DATA:  Pt found by son after having unwitnessed fall, pt does not recall event. Bloody right ear noted. EXAM: CT HEAD WITHOUT CONTRAST TECHNIQUE: Contiguous axial images were obtained from the base of the skull through the vertex without intravenous contrast. COMPARISON:  04/21/2017 FINDINGS: Brain: No evidence of acute infarction, hemorrhage, hydrocephalus, extra-axial collection or mass lesion/mass effect. There is ventricular and sulcal enlargement reflecting age related volume loss. Patchy white matter  hypoattenuation is noted consistent with moderate to advanced chronic microvascular ischemic change. There also several small more well-defined foci of hypoattenuation in the white matter, with another in the right inferior cerebellum, consistent with old lacune infarcts. Vascular: No hyperdense vessel or unexpected calcification. Skull: Normal. Negative for fracture or focal lesion. Sinuses/Orbits: Globes and orbits are unremarkable. Sinuses, mastoid air cells and middle ear cavities are clear. Other: None. IMPRESSION: 1. No acute intracranial abnormalities. 2. Age related volume loss, moderate to advanced chronic microvascular ischemic change and several old lacune infarcts, stable from the prior CT. Electronically Signed   By: Lajean Manes M.D.   On: 06/04/2017 17:20   Mr Brain Wo Contrast  Result Date: 06/05/2017 CLINICAL DATA:  Syncopal episode. Fell and hit head. Evaluate recurrent syncope. History of hypertension, stroke. EXAM: MRI HEAD WITHOUT CONTRAST TECHNIQUE: Multiplanar, multiecho pulse sequences of the brain and surrounding structures were obtained without intravenous contrast. COMPARISON:  CT HEAD April 21, 2017 and MRI of the head March 11, 2017 FINDINGS: BRAIN: Multiple subcentimeter to punctate foci of reduced diffusion RIGHT cerebral peduncle and inferior RIGHT thalamus with low ADC values though punctate lesions are difficult to evaluate. Subcentimeter reduced diffusion and faint T1 shortening LEFT basal ganglia to internal capsule, new from prior MRI. Numerous supra and infratentorial scattered chronic micro hemorrhages, nonspecific distribution. Confluent supratentorial and patchy pontine white matter FLAIR T2 hyperintensities. Prominent perivascular spaces basal ganglia and thalamus associated with chronic small vessel ischemic disease. Old small RIGHT cerebellar infarct. Ventricles and sulci are normal for patient's age. No midline shift, mass effect or masses. No abnormal  extra-axial fluid collections. VASCULAR: Normal major intracranial vascular flow voids present at skull base. SKULL AND UPPER CERVICAL SPINE: No abnormal sellar expansion. No suspicious calvarial bone marrow signal. Craniocervical junction maintained. Moderate LEFT parietal scalp hematoma. SINUSES/ORBITS: The mastoid air-cells and included paranasal sinuses are well-aerated. The included ocular globes and orbital contents are non-suspicious. Status post bilateral ocular lens implants. OTHER: None. IMPRESSION: 1. Acute subcentimeter RIGHT cerebral peduncle and thalamus infarcts. 2. Subcentimeter subacute hemorrhage LEFT basal ganglia to internal capsule. 3. Severe chronic small vessel ischemic disease. Old small RIGHT cerebellar infarct. 4. Numerous chronic micro hemorrhages seen with amyloid angiopathy and, severe chronic hypertension. Electronically Signed   By: Elon Alas M.D.   On: 06/05/2017 01:23   Ir Radiologist Eval & Mgmt  Result Date: 06/09/2017 INDICATION: Vertebrobasilar insufficiency symptoms. History of left subclavian artery stenosis proximally. EXAM: ESTABLISHED PATIENT OFFICE VISIT CHIEF COMPLAINT: Dizziness. Vertebrobasilar insufficiency symptoms of blurred vision and gait imbalance. Patient with known history of left subclavian artery stenosis proximally. Current Pain Level: 1-10 HISTORY OF PRESENT ILLNESS: The patient is an 83 year old right-handed lady who has been referred for evaluation of treatment of left subclavian artery stenosis confirmed angiographically on 05/13/2017. The patient was most recently admitted with symptoms of vertical diplopia and syncope on 06/04/2017. The patient does not recall the event of having passed out. However, does reveal experiencing blurred vision and diplopia prior to passing out. She was subsequently brought to the hospital where she underwent further investigations in the form of MRI MRA of the brain. MRI of the brain revealed two small focal areas  of diffusion restriction involving the right periventricular region of the hypothalamus, and the right middle cerebral peduncle. The MRI also revealed previous ischemic changes related to small vessel type disease etiology involving the periventricular white matter, the subcortical white matter bifrontally and the centrum semiovale. Also seen were previous areas of ischemia involving the right cerebral hemisphere. Also noted were multiple focal areas of miliary type hypointensity in both cerebral hemispheres and to a lesser degree the cerebellum usually seen in patients with chronic uncontrolled hypertension though the patient denies having had untreated hypertension. The patient was then subsequently discharged and was brought to the consultation with her son. The patient verbalizes that she has had no further symptoms of vertebrobasilar insufficiency. However, she does feel generally tired all the time. Her appetite is slowly improving. She has no difficulty chewing or swallowing liquids. She denies any perioral numbness, or of pulsatile tinnitus. She walks without assistance though the son states she does have a walker at home. The patient lives by herself and is able to cope with her daily activities. However, the family does keep an eye on her on a daily basis. The patient denies any undue headaches, nausea, vomiting, autonomic dysfunction of her bowel or bladder. She denies any chest pain, shortness  of breath or breathing difficulties. She denies any wheezing, or coughing or hemoptysis. She denies any abdominal pains or constipation or diarrhea. The patient denies any symptoms of recent chills, fever or rigors. She denies any lower abdominal pain related to UTI. She denies any dysuria, hematuria or polyuria. The patient is on chronic trimethoprim. According to her this was started by her urologist almost a year ago secondary to repeated UTIs. She hasn't visited with her urologist recently. Past medical  history, family history and social history as per recent a discharge summary unchanged. She denies drinking alcohol or using cigarettes or illegal chemicals. She denies any history of diabetes. REVIEW OF SYSTEMS: As above. PHYSICAL EXAMINATION: The patient is awake, alert and oriented to time, place, space. Responses are appropriate and prompt. No gross neurological abnormalities discerned grossly. The patient is able to walk independently without losing balance. ASSESSMENT AND PLAN: The patient's recent MRI scan of the brain, on the most recent admission, and the cerebral arteriogram were reviewed with her and her son. Brought to their attention was the probable high-grade stenosis of the proximal left subclavian artery. The patient also has symptoms of stenosis of the right vertebral artery at its origin. The procedure of stent assisted angioplasty of a severe left subclavian artery stenosis was reviewed with her and her son. Risks of the ischemic stroke of 1 - 5%, with a potential worsening neurological function and remotely of death and dependency were reviewed. Given that the patient has failed dual anti-platelet therapy, they are in agreement to proceed with endovascular treatment of the left subclavian artery severe stenosis. The patient will be restarted on Plavix 75 mg a day. She has been advised to continue taking her aspirin 81 mg a day in addition to all her other medications. Given her history of UTIs, a urinalysis will be arranged at her primary care physician's office on the Friday before the scheduled procedure on the Wednesday following that Friday. On the Monday the patient undergo a P2Y12 testing. Questions were answered to their satisfaction. They were asked to call should they have any concerns or questions. Should her symptoms returned, the patient and the son were asked to call [phone]. They both leave with good understanding and agreement with the above management plan. Electronically Signed    By: Luanne Bras M.D.   On: 06/08/2017 14:03    Labs:  CBC: Recent Labs    05/13/17 1003 06/04/17 1638 06/04/17 1654 06/16/17 0736  WBC 6.9 10.1  --  8.4  HGB 15.3* 15.3* 16.3* 14.2  HCT 45.5 46.2* 48.0* 43.0  PLT 135* 157  --  176    COAGS: Recent Labs    05/13/17 1046 06/04/17 1638 06/16/17 0736  INR 0.99 0.93 0.92  APTT 32 30 32    BMP: Recent Labs    05/13/17 1046 06/04/17 1638 06/04/17 1654 06/16/17 0736  NA 138 137 140 139  K 4.3 4.1 4.0 4.3  CL 104 104 104 107  CO2 26 24  --  24  GLUCOSE 102* 193* 198* 118*  BUN 9 14 16  21*  CALCIUM 9.4 9.9  --  9.0  CREATININE 1.07* 1.02* 0.90 1.06*  GFRNONAA 46* 49*  --  47*  GFRAA 53* 56*  --  54*    LIVER FUNCTION TESTS: Recent Labs    06/04/17 1638 06/16/17 0736  BILITOT 0.5 0.7  AST 37 30  ALT 43 39  ALKPHOS 52 49  PROT 7.5 6.7  ALBUMIN 3.9 3.4*  TUMOR MARKERS: No results for input(s): AFPTM, CEA, CA199, CHROMGRNA in the last 8760 hours.  Assessment and Plan: Patient with past medical history of subclavian artery stenosis presents to radiology department today for possible intervention.  Patient presents today in their usual state of health.  She has been NPO and is not currently on blood thinners.  She has taken her Plavix and aspirin regularly.  Her P2Y12 this AM is 162.  She is given a second dose of Plavix 75mg .  Risks and benefits were discussed with the patient including, but not limited to bleeding, infection, vascular injury or contrast induced renal failure.  This interventional procedure involves the use of X-rays and because of the nature of the planned procedure, it is possible that we will have prolonged use of X-ray fluoroscopy.  Potential radiation risks to you include (but are not limited to) the following: - A slightly elevated risk for cancer several years later in life. This risk is typically less than 0.5% percent. This risk is low in comparison to the normal incidence  of human cancer, which is 33% for women and 50% for men according to the [place]. - Radiation induced injury can include skin redness, resembling a rash, tissue breakdown / ulcers and hair loss (which can be temporary or permanent).   The likelihood of either of these occurring depends on the difficulty of the procedure and whether you are sensitive to radiation due to previous procedures, disease, or genetic conditions.   IF your procedure requires a prolonged use of radiation, you will be notified and given written instructions for further action.  It is your responsibility to monitor the irradiated area for the 2 weeks following the procedure and to notify your physician if you are concerned that you have suffered a radiation induced injury.    All of the patient's questions were answered, patient is agreeable to proceed.  Consent signed and in chart.  Thank you for this interesting consult.  I greatly enjoyed meeting Aven B Probasco and look forward to participating in their care.  A copy of this report was sent to the requesting provider on this date.  Electronically Signed: Docia Barrier, PA 06/16/2017, 9:13 AM   I spent a total of    15 Minutes in face to face in clinical consultation, greater than 50% of which was counseling/coordinating care for subclavian artery stenosis.

## 2017-06-16 NOTE — Procedures (Signed)
S/P Lt subclavian arteriogram. RT CFA approach. Fidings. 1.Approx 65 % stenosis of prox Lt subclavian artery.

## 2017-06-16 NOTE — H&P (Deleted)
  The note originally documented on this encounter has been moved the the encounter in which it belongs.  

## 2017-06-16 NOTE — Discharge Instructions (Signed)
**Note -identified via Obfuscation** Angiogram, Care After °This sheet gives you information about how to care for yourself after your procedure. Your doctor may also give you more specific instructions. If you have problems or questions, contact your doctor. °Follow these instructions at home: °Insertion site care °· Follow instructions from your doctor about how to take care of your long, thin tube (catheter) insertion area. Make sure you: °? Wash your hands with soap and water before you change your bandage (dressing). If you cannot use soap and water, use hand sanitizer. °? Change your bandage as told by your doctor. °? Leave stitches (sutures), skin glue, or skin tape (adhesive) strips in place. They may need to stay in place for 2 weeks or longer. If tape strips get loose and curl up, you may trim the loose edges. Do not remove tape strips completely unless your doctor says it is okay. °· Do not take baths, swim, or use a hot tub until your doctor says it is okay. °· You may shower 24-48 hours after the procedure or as told by your doctor. °? Gently wash the area with plain soap and water. °? Pat the area dry with a clean towel. °? Do not rub the area. This may cause bleeding. °· Do not apply powder or lotion to the area. Keep the area clean and dry. °· Check your insertion area every day for signs of infection. Check for: °? More redness, swelling, or pain. °? Fluid or blood. °? Warmth. °? Pus or a bad smell. °Activity °· Rest as told by your doctor, usually for 1-2 days. °· Do not lift anything that is heavier than 10 lbs. (4.5 kg) or as told by your doctor. °· Do not drive for 24 hours if you were given a medicine to help you relax (sedative). °· Do not drive or use heavy machinery while taking prescription pain medicine. °General instructions °· Go back to your normal activities as told by your doctor, usually in about a week. Ask your doctor what activities are safe for you. °· If the insertion area starts to bleed, lie flat and put pressure  on the area. If the bleeding does not stop, get help right away. This is an emergency. °· Drink enough fluid to keep your pee (urine) clear or pale yellow. °· Take over-the-counter and prescription medicines only as told by your doctor. °· Keep all follow-up visits as told by your doctor. This is important. °Contact a doctor if: °· You have a fever. °· You have chills. °· You have more redness, swelling, or pain around your insertion area. °· You have fluid or blood coming from your insertion area. °· The insertion area feels warm to the touch. °· You have pus or a bad smell coming from your insertion area. °· You have more bruising around the insertion area. °· Blood collects in the tissue around the insertion area (hematoma) that may be painful to the touch. °Get help right away if: °· You have a lot of pain in the insertion area. °· The insertion area swells very fast. °· The insertion area is bleeding, and the bleeding does not stop after holding steady pressure on the area. °· The area near or just beyond the insertion area becomes pale, cool, tingly, or numb. °These symptoms may be an emergency. Do not wait to see if the symptoms will go away. Get medical help right away. Call your local emergency services (911 in the [place] Do not drive yourself to the hospital. °Summary °·  **Note -identified via Obfuscation** After the procedure, it is common to have bruising and tenderness at the long, thin tube insertion area. °· After the procedure, it is important to rest and drink plenty of fluids. °· Do not take baths, swim, or use a hot tub until your doctor says it is okay to do so. You may shower 24-48 hours after the procedure or as told by your doctor. °· If the insertion area starts to bleed, lie flat and put pressure on the area. If the bleeding does not stop, get help right away. This is an emergency. °This information is not intended to replace advice given to you by your health care provider. Make sure you discuss any questions you have with  your health care provider. °Document Released: 10/16/2008 Document Revised: 07/14/2016 Document Reviewed: 07/14/2016 °Elsevier Interactive Patient Education © 2017 [employer] ° °

## 2017-06-16 NOTE — Anesthesia Postprocedure Evaluation (Signed)
Anesthesia Post Note  Patient: Tracey Hartman  Procedure(s) Performed: ANGIOPLASTY WITH STENTING (N/A )     Patient location during evaluation: PACU Anesthesia Type: MAC Level of consciousness: awake and alert Pain management: pain level controlled Vital Signs Assessment: post-procedure vital signs reviewed and stable Respiratory status: spontaneous breathing and respiratory function stable Cardiovascular status: stable Postop Assessment: no apparent nausea or vomiting Anesthetic complications: no    Last Vitals:  Vitals:   06/16/17 0717 06/16/17 0900  BP: (!) 145/80 (!) 174/82  Pulse: (!) 59 63  Resp: 18   Temp: 36.6 C   SpO2: 96%     Last Pain:  Vitals:   06/16/17 0804  TempSrc:   PainSc: 0-No pain                 Pao Haffey DANIEL

## 2017-06-16 NOTE — Transfer of Care (Signed)
Immediate Anesthesia Transfer of Care Note  Patient: Tracey Hartman  Procedure(s) Performed: ANGIOPLASTY WITH STENTING (N/A )  Patient Location: Short Stay  Anesthesia Type:MAC  Level of Consciousness: awake, alert  and oriented  Airway & Oxygen Therapy: Patient Spontanous Breathing and Patient connected to nasal cannula oxygen  Post-op Assessment: Report given to RN, Post -op Vital signs reviewed and stable and Patient moving all extremities X 4  Post vital signs: Reviewed and stable  Last Vitals:  Vitals:   06/16/17 0717 06/16/17 0900  BP: (!) 145/80 (!) 174/82  Pulse: (!) 59 63  Resp: 18   Temp: 36.6 C   SpO2: 96%     Last Pain:  Vitals:   06/16/17 0804  TempSrc:   PainSc: 0-No pain      Patients Stated Pain Goal: 2 (30/09/23 3007)  Complications: No apparent anesthesia complications

## 2017-06-16 NOTE — Anesthesia Preprocedure Evaluation (Signed)
Anesthesia Evaluation    Reviewed: Allergy & Precautions, Patient's Chart, lab work & pertinent test results  History of Anesthesia Complications Negative for: history of anesthetic complications  Airway Mallampati: I  TM Distance: >3 FB  Positive for:  Tracheal deviation   Dental  (+) Edentulous Upper, Edentulous Lower   Pulmonary former smoker,    Pulmonary exam normal        Cardiovascular hypertension, + CAD and + Past MI  Normal cardiovascular exam     Neuro/Psych TIACVA negative psych ROS   GI/Hepatic negative GI ROS, Neg liver ROS,   Endo/Other  negative endocrine ROS  Renal/GU negative Renal ROS     Musculoskeletal negative musculoskeletal ROS (+)   Abdominal   Peds  Hematology negative hematology ROS (+)   Anesthesia Other Findings Day of surgery medications reviewed with the patient.  Reproductive/Obstetrics                             Anesthesia Physical Anesthesia Plan  ASA: III  Anesthesia Plan: MAC and General   Post-op Pain Management:    Induction:   PONV Risk Score and Plan: 3 and Ondansetron and Dexamethasone  Airway Management Planned: Oral ETT  Additional Equipment:   Intra-op Plan:   Post-operative Plan: Possible Post-op intubation/ventilation  Informed Consent: I have reviewed the patients History and Physical, chart, labs and discussed the procedure including the risks, benefits and alternatives for the proposed anesthesia with the patient or authorized representative who has indicated his/her understanding and acceptance.   Dental advisory given  Plan Discussed with: CRNA and Anesthesiologist  Anesthesia Plan Comments:         Anesthesia Quick Evaluation

## 2017-06-16 NOTE — Progress Notes (Signed)
Pt taken to [facility] to recover, since no intervention needed. Groin WNL, VS stable, report given from [facility]. IR team signing off

## 2017-06-17 ENCOUNTER — Encounter (HOSPITAL_COMMUNITY): Payer: Self-pay | Admitting: Interventional Radiology

## 2017-06-23 ENCOUNTER — Telehealth: Payer: Self-pay | Admitting: Neurology

## 2017-06-23 NOTE — Telephone Encounter (Signed)
Pt son John(on DPR) called  And stated that the family receieved a call from [facility] re: physical therapy, one of the sons stated they wanted to wait.  John now stating they would like to know if pt will be set up for out patient PT or In Home physical therapy.  Pt son Jenny Reichmann was informed that Dr Leonie Man will return to the office on Monday, please call pt son then

## 2017-06-28 ENCOUNTER — Other Ambulatory Visit: Payer: Self-pay | Admitting: Neurology

## 2017-06-28 ENCOUNTER — Telehealth: Payer: Self-pay | Admitting: Neurology

## 2017-06-28 NOTE — Telephone Encounter (Signed)
Called and Spoke to [facility] and they have received Referral . [facility] will call  To schedule . Thanks Hinton Dyer .  General 06/28/2017 1:15 PM [facility], [facility] - -  Note   Please call Lambert Mody To schedule Patient's apt's Telephone [phone]

## 2017-06-28 NOTE — Telephone Encounter (Signed)
Referral sent by Hinton Dyer to [facility] outpatient therapy.

## 2017-06-28 NOTE — Telephone Encounter (Signed)
Dr.Sethi patient would like outpatient therapy.Please advised thanks

## 2017-06-30 ENCOUNTER — Ambulatory Visit: Payer: Medicare Other | Attending: Family Medicine | Admitting: Physical Therapy

## 2017-06-30 ENCOUNTER — Encounter: Payer: Self-pay | Admitting: Physical Therapy

## 2017-06-30 DIAGNOSIS — R262 Difficulty in walking, not elsewhere classified: Secondary | ICD-10-CM | POA: Insufficient documentation

## 2017-06-30 DIAGNOSIS — M6281 Muscle weakness (generalized): Secondary | ICD-10-CM | POA: Insufficient documentation

## 2017-06-30 NOTE — Therapy (Signed)
[place] MAIN [facility] SERVICES [address] [place], [place], [address] Phone: [phone]   Fax:  [phone]  Physical Therapy Evaluation  Patient Details  Name: Tracey Hartman MRN: [id number] Date of Birth: [birth year] Referring Provider: Rosalin Hawking,   Encounter Date: 06/30/2017  PT End of Session - 06/30/17 1529    Visit Number  1    Number of Visits  17    Date for PT Re-Evaluation  08/25/17    PT Start Time  0315    PT Stop Time  0400    PT Time Calculation (min)  45 min    Equipment Utilized During Treatment  Gait belt    Activity Tolerance  Patient tolerated treatment well;No increased pain;Patient limited by fatigue    Behavior During Therapy  [facility] for tasks assessed/performed       Past Medical History:  Diagnosis Date  . Coronary artery disease   . Hypertension   . Myocardial infarction ([place]) 1986  . Osteoporosis   . Recurrent UTI   . Stroke [facility])     Past Surgical History:  Procedure Laterality Date  . APPENDECTOMY    . CATARACT EXTRACTION Bilateral   . COLONOSCOPY    . IR ANGIO INTRA EXTRACRAN SEL COM CAROTID INNOMINATE BILAT MOD SED  05/13/2017  . IR ANGIO VERTEBRAL SEL SUBCLAVIAN INNOMINATE UNI L MOD SED  06/16/2017  . IR ANGIO VERTEBRAL SEL SUBCLAVIAN INNOMINATE UNI R MOD SED  05/13/2017  . IR ANGIO VERTEBRAL SEL VERTEBRAL UNI L MOD SED  05/13/2017  . IR RADIOLOGIST EVAL & MGMT  06/08/2017  . RADIOLOGY WITH ANESTHESIA N/A 06/16/2017   Procedure: ANGIOPLASTY WITH STENTING;  Surgeon: Luanne Bras, MD;  Location: [facility];  Service: Radiology;  Laterality: N/A;    There were no vitals filed for this visit.   Subjective Assessment - 06/30/17 1518    Subjective  Patient had a fall and she hit her head. She feels a little unsteady and now her family is rotating staying with her for safety concerns.     Patient is accompained by:  Family member    Pertinent History  Patinet lives alone and uses a RW if she needs  it. Her children are staying with her after her recent fall. She has lived on her own up until recently when she had a fall and hit her head needing stiches. She has 3 sons and they are rotating staying with her for safety concerns with her balance     Patient Stated Goals  Patient wants to be able to walk better.     Currently in Pain?  No/denies    Pain Score  0-No pain         [facility] PT Assessment - 06/30/17 1522      Assessment   Medical Diagnosis  imbalance    Referring Provider  Rosalin Hawking,    Onset Date/Surgical Date  06/05/17    Hand Dominance  Right    Prior Therapy  no      Precautions   Precautions  Fall      Restrictions   Weight Bearing Restrictions  No    Other Position/Activity Restrictions  no      Balance Screen   Has the patient fallen in the past 6 months  Yes    How many times?  1    Has the patient had a decrease in activity level because of a fear of falling?   No  Is the patient reluctant to leave their home because of a fear of falling?   No      Home Film/video editor residence    Living Arrangements  Alone;Children    Available Help at Discharge  Family    Type of [place] Access  Level entry    Home Layout  One level    [place] - 2 wheels;Shower seat;Grab bars - tub/shower;Hand held shower head      Prior Function   Level of Independence  Independent with basic ADLs;Independent with transfers;Independent with gait    Vocation  Retired    Leisure  church,           POSTURE: WNL   PROM/AROM:WFL  STRENGTH:  Graded on a 0-5 scale Muscle Group Left Right                          Hip Flex 4/5 4/5  Hip Abd 4/5 4/5  Hip Add 3/5 3/5  Hip Ext 4/5 4/5  Hip IR/ER 3/5 3/5  Knee Flex 5/5 5/5  Knee Ext 5/5 5/5  Ankle DF 4/5 4/5  Ankle PF 3/5 3/5   SENSATION:WNL   FUNCTIONAL MOBILITY: Patient transfers with SBA due to pushing against the back of the chair for  stabiity   BALANCE: Static Standing Balance  Normal Able to maintain standing balance against maximal resistance   Good Able to maintain standing balance against moderate resistance   Good-/Fair+ Able to maintain standing balance against minimal resistance x  Fair Able to stand unsupported without UE support and without LOB for 1-2 min   Fair- Requires Min A and UE support to maintain standing without loss of balance   Poor+ Requires mod A and UE support to maintain standing without loss of balance   Poor Requires max A and UE support to maintain standing balance without loss    Standing Dynamic Balance  Normal Stand independently unsupported, able to weight shift and cross midline maximally   Good Stand independently unsupported, able to weight shift and cross midline moderately   Good-/Fair+ Stand independently unsupported, able to weight shift across midline minimally x  Fair Stand independently unsupported, weight shift, and reach ipsilaterally, loss of balance when crossing midline   Poor+ Able to stand with Min A and reach ipsilaterally, unable to weight shift   Poor Able to stand with Mod A and minimally reach ipsilaterally, unable to cross midline.       GAIT    :Ambulates without AD with slow gait speed 1.0  OUTCOME MEASURES: TEST Outcome Interpretation  5 times sit<>stand 30.21sec >52 yo, >15 sec indicates increased risk for falls  10 meter walk test           1.0      m/s <1.0 m/s indicates increased risk for falls; limited community ambulator  Timed up and Go   19.81              sec <14 sec indicates increased risk for falls                      Objective measurements completed on examination: See above findings.    Treatment: Hip abd x 10 x 2 BLE YTB Hip ext x 10 x 2 BLE YTB Heel raises BLE x 20          PT  Education - 06/30/17 1528    Education provided  Yes    Education Details  POC    Person(s) Educated  Patient    Methods   Explanation;Demonstration    Comprehension  Verbalized understanding       PT Short Term Goals - 06/30/17 1650      PT SHORT TERM GOAL #1   Title  Patient will be independent in home exercise program to improve strength/mobility for better functional independence with ADLs    Time  4    Period  Weeks    Status  New    Target Date  07/28/17      PT SHORT TERM GOAL #2   Title  Patient (< 66 years old) will complete five times sit to stand test in < 15 seconds indicating an increased LE strength and improved balance.    Baseline  30.2 sec    Time  4    Period  Weeks    Status  New    Target Date  07/28/17        PT Long Term Goals - 06/30/17 1702      PT LONG TERM GOAL #1   Title  Patient will reduce timed up and go to <11 seconds to reduce fall risk and demonstrate improved transfer/gait ability.    Baseline  19.2 sec    Time  8    Period  Weeks    Status  New    Target Date  08/25/17      PT LONG TERM GOAL #2   Title  Patient (> 26 years old) will complete five times sit to stand test in < 15 seconds indicating an increased LE strength and improved balance.    Baseline  30.2 sec    Time  8    Period  Weeks    Status  New    Target Date  08/25/17      PT LONG TERM GOAL #3   Title  Patient will tolerate 5 seconds of single leg stance without loss of balance to improve ability to get in and out of shower safely.    Baseline  2 sec BLE    Time  8    Period  Weeks    Status  New    Target Date  08/25/17      PT LONG TERM GOAL #4   Title  Patient will increase BLE gross strength to 4+/5 as to improve functional strength for independent gait, increased standing tolerance and increased ADL ability.    Baseline  4/5 BLE hips except hip add and hip ext, ankle 3/5 BLE PF    Time  8    Period  Weeks    Status  New    Target Date  08/25/17             Plan - 06/30/17 1648    Clinical Impression Statement  Patient is 83 year old female with a recent fall hitting  her head and needing stiches. She lived alone and now has her children taking turns to supervise her. She has decreased strength BLE hips and ankles, decreased static and dynamic stanidng balance. She ambulates with a RW intermittently at home but not all the time. She has decreased 5 x sit to stand indicating a falls risk and will benefit from skilled PT to improve safety, balance, increase her strength, and decrease her falls risk.    Clinical Presentation  Stable  Clinical Decision Making  Low    Rehab Potential  Good    PT Frequency  2x / week    PT Duration  8 weeks    PT Treatment/Interventions  Patient/family education;Therapeutic exercise;Therapeutic activities;Functional mobility training;Balance training;Neuromuscular re-education    PT Next Visit Plan  balance training and hip and ankle strengthening    PT Home Exercise Plan  heel raises, hip abd, hip ext with YTB    Consulted and Agree with Plan of Care  Patient;Family member/caregiver       Patient will benefit from skilled therapeutic intervention in order to improve the following deficits and impairments:  Abnormal gait, Decreased balance, Decreased endurance, Decreased mobility, Difficulty walking, Decreased strength  Visit Diagnosis: Muscle weakness (generalized)  Difficulty in walking, not elsewhere classified     Problem List Patient Active Problem List   Diagnosis Date Noted  . Cerebral thrombosis with cerebral infarction 06/05/2017  . Syncope 06/04/2017  . Lacunar infarction 02/20/2014  . Small vessel disease, cerebrovascular 02/20/2014  . Abnormality of gait 02/20/2014  . HYPERCHOLESTEROLEMIA  IIA 08/25/2010  . CORONARY ATHEROSCLEROSIS NATIVE CORONARY ARTERY 08/25/2010  . CHEST PAIN, PRECORDIAL 08/25/2010    Alanson Puls, PT DPT 07/01/2017, 8:18 AM  [place] MAIN [facility] SERVICES [place], [place], [address] Phone: [phone]   Fax:   [phone]  Name: CAMRY ROBELLO MRN: [id number] Date of Birth: 1930/05/17

## 2017-07-08 ENCOUNTER — Encounter: Payer: Self-pay | Admitting: Physical Therapy

## 2017-07-08 ENCOUNTER — Ambulatory Visit: Payer: Medicare Other | Attending: Family Medicine | Admitting: Physical Therapy

## 2017-07-08 DIAGNOSIS — M6281 Muscle weakness (generalized): Secondary | ICD-10-CM | POA: Diagnosis not present

## 2017-07-08 DIAGNOSIS — R262 Difficulty in walking, not elsewhere classified: Secondary | ICD-10-CM | POA: Insufficient documentation

## 2017-07-08 NOTE — Therapy (Signed)
[place] MAIN [facility] SERVICES [address] [place], [place], [address] Phone: [phone]   Fax:  [phone]  Physical Therapy Treatment  Patient Details  Name: Tracey Hartman MRN: [id number] Date of Birth: March 23, 1930 Referring Provider: Rosalin Hawking,   Encounter Date: 07/08/2017  PT End of Session - 07/08/17 1523    Visit Number  2    Number of Visits  17    Date for PT Re-Evaluation  08/25/17    PT Start Time  0315    PT Stop Time  0400    PT Time Calculation (min)  45 min    Equipment Utilized During Treatment  Gait belt    Activity Tolerance  Patient tolerated treatment well;No increased pain;Patient limited by fatigue    Behavior During Therapy  [facility] for tasks assessed/performed       Past Medical History:  Diagnosis Date  . Coronary artery disease   . Hypertension   . Myocardial infarction ([place]) 1986  . Osteoporosis   . Recurrent UTI   . Stroke [facility])     Past Surgical History:  Procedure Laterality Date  . APPENDECTOMY    . CATARACT EXTRACTION Bilateral   . COLONOSCOPY    . IR ANGIO INTRA EXTRACRAN SEL COM CAROTID INNOMINATE BILAT MOD SED  05/13/2017  . IR ANGIO VERTEBRAL SEL SUBCLAVIAN INNOMINATE UNI L MOD SED  06/16/2017  . IR ANGIO VERTEBRAL SEL SUBCLAVIAN INNOMINATE UNI R MOD SED  05/13/2017  . IR ANGIO VERTEBRAL SEL VERTEBRAL UNI L MOD SED  05/13/2017  . IR RADIOLOGIST EVAL & MGMT  06/08/2017  . RADIOLOGY WITH ANESTHESIA N/A 06/16/2017   Procedure: ANGIOPLASTY WITH STENTING;  Surgeon: Luanne Bras, MD;  Location: [facility];  Service: Radiology;  Laterality: N/A;    There were no vitals filed for this visit.  Subjective Assessment - 07/08/17 1522    Subjective  Patient is doing well today, no new concerns.    Patient is accompained by:  Family member    Pertinent History  Patinet lives alone and uses a RW if she needs it. Her children are staying with her after her recent fall. She has lived on her own up until  recently when she had a fall and hit her head needing stiches. She has 3 sons and they are rotating staying with her for safety concerns with her balance     Patient Stated Goals  Patient wants to be able to walk better.     Currently in Pain?  No/denies    Pain Score  0-No pain       THER-EX Standing exercises with YTB:: Abduction 2 x 10; Extension 2 x 10; Heel raises 2 x 10; Resisted side-steeping YTB 4 lengths x 2; Sit to stand without UE support 2 x 10; Step-ups to 6" step x 10 bilateral; Quantum leg press 75 # x 10, x 3   NEUROMUSCULAR RE-EDUCATION Airex NBOS eyes open x 30 seconds x 3, feet together Airex  Tandem stand with head turns x 30 seconds; Tandem gait in // bars x 4 laps,need of UE     Continuous verbal cues and tactile cues needed to relax his shoulder muscles. Patient has fatigue with standing exercises and needs constant VC to have correct posture                     PT Education - 07/08/17 1523    Education provided  Yes    Education  Details  safety with transfers    Person(s) Educated  Patient    Methods  Explanation    Comprehension  Verbalized understanding       PT Short Term Goals - 06/30/17 1650      PT SHORT TERM GOAL #1   Title  Patient will be independent in home exercise program to improve strength/mobility for better functional independence with ADLs    Time  4    Period  Weeks    Status  New    Target Date  07/28/17      PT SHORT TERM GOAL #2   Title  Patient (< 35 years old) will complete five times sit to stand test in < 15 seconds indicating an increased LE strength and improved balance.    Baseline  30.2 sec    Time  4    Period  Weeks    Status  New    Target Date  07/28/17        PT Long Term Goals - 06/30/17 1702      PT LONG TERM GOAL #1   Title  Patient will reduce timed up and go to <11 seconds to reduce fall risk and demonstrate improved transfer/gait ability.    Baseline  19.2 sec    Time  8     Period  Weeks    Status  New    Target Date  08/25/17      PT LONG TERM GOAL #2   Title  Patient (> 45 years old) will complete five times sit to stand test in < 15 seconds indicating an increased LE strength and improved balance.    Baseline  30.2 sec    Time  8    Period  Weeks    Status  New    Target Date  08/25/17      PT LONG TERM GOAL #3   Title  Patient will tolerate 5 seconds of single leg stance without loss of balance to improve ability to get in and out of shower safely.    Baseline  2 sec BLE    Time  8    Period  Weeks    Status  New    Target Date  08/25/17      PT LONG TERM GOAL #4   Title  Patient will increase BLE gross strength to 4+/5 as to improve functional strength for independent gait, increased standing tolerance and increased ADL ability.    Baseline  4/5 BLE hips except hip add and hip ext, ankle 3/5 BLE PF    Time  8    Period  Weeks    Status  New    Target Date  08/25/17            Plan - 07/08/17 1524    Clinical Impression Statement  Patient required min verbal cues to perform 3 way hip with red theraband correctly and required verbal and tactile cues during all dynamic standing balance activities. Patient demonstrated decreased gait speed with ambulation without AD.  Patient will continue to benefit from skilled therapy in order to improve strength, dynamic standing balance and increase gait speed to reduce risk for falls    Rehab Potential  Good    PT Frequency  2x / week    PT Duration  8 weeks    PT Treatment/Interventions  Patient/family education;Therapeutic exercise;Therapeutic activities;Functional mobility training;Balance training;Neuromuscular re-education    PT Next Visit Plan  balance training and hip  and ankle strengthening    PT Home Exercise Plan  heel raises, hip abd, hip ext with YTB    Consulted and Agree with Plan of Care  Patient;Family member/caregiver       Patient will benefit from skilled therapeutic intervention  in order to improve the following deficits and impairments:  Abnormal gait, Decreased balance, Decreased endurance, Decreased mobility, Difficulty walking, Decreased strength  Visit Diagnosis: Muscle weakness (generalized)  Difficulty in walking, not elsewhere classified     Problem List Patient Active Problem List   Diagnosis Date Noted  . Cerebral thrombosis with cerebral infarction 06/05/2017  . Syncope 06/04/2017  . Lacunar infarction 02/20/2014  . Small vessel disease, cerebrovascular 02/20/2014  . Abnormality of gait 02/20/2014  . HYPERCHOLESTEROLEMIA  IIA 08/25/2010  . CORONARY ATHEROSCLEROSIS NATIVE CORONARY ARTERY 08/25/2010  . CHEST PAIN, PRECORDIAL 08/25/2010    Alanson Puls, PT  DPT 07/08/2017, 3:25 PM  [place] MAIN [facility] SERVICES [address] [place], [place], [address] Phone: [phone]   Fax:  [phone]  Name: SUNDY HOUCHINS MRN: [id number] Date of Birth: 1929-09-14

## 2017-07-09 ENCOUNTER — Encounter: Payer: Self-pay | Admitting: Neurology

## 2017-07-12 ENCOUNTER — Ambulatory Visit: Payer: Medicare Other | Admitting: Physical Therapy

## 2017-07-15 ENCOUNTER — Encounter: Payer: Self-pay | Admitting: Physical Therapy

## 2017-07-15 ENCOUNTER — Ambulatory Visit: Payer: Medicare Other | Admitting: Physical Therapy

## 2017-07-15 DIAGNOSIS — M6281 Muscle weakness (generalized): Secondary | ICD-10-CM | POA: Diagnosis not present

## 2017-07-15 DIAGNOSIS — R262 Difficulty in walking, not elsewhere classified: Secondary | ICD-10-CM | POA: Diagnosis not present

## 2017-07-15 NOTE — Therapy (Signed)
[address] MAIN [facility] SERVICES [address] [place], [place], [address] Phone: [phone]   Fax:  [phone]  Physical Therapy Treatment  Patient Details  Name: Tracey Hartman MRN: [id number] Date of Birth: 11/19/1929 Referring Provider: Rosalin Hawking,   Encounter Date: 07/15/2017  PT End of Session - 07/15/17 1600    Visit Number  3    Number of Visits  17    Date for PT Re-Evaluation  08/25/17    Authorization Type  3/10 g codes    PT Start Time  0488    PT Stop Time  0435    PT Time Calculation (min)  40 min    Equipment Utilized During Treatment  Gait belt    Activity Tolerance  Patient tolerated treatment well;No increased pain;Patient limited by fatigue    Behavior During Therapy  [facility] for tasks assessed/performed       Past Medical History:  Diagnosis Date  . Coronary artery disease   . Hypertension   . Myocardial infarction ([place]) 1986  . Osteoporosis   . Recurrent UTI   . Stroke [facility])     Past Surgical History:  Procedure Laterality Date  . APPENDECTOMY    . CATARACT EXTRACTION Bilateral   . COLONOSCOPY    . IR ANGIO INTRA EXTRACRAN SEL COM CAROTID INNOMINATE BILAT MOD SED  05/13/2017  . IR ANGIO VERTEBRAL SEL SUBCLAVIAN INNOMINATE UNI L MOD SED  06/16/2017  . IR ANGIO VERTEBRAL SEL SUBCLAVIAN INNOMINATE UNI R MOD SED  05/13/2017  . IR ANGIO VERTEBRAL SEL VERTEBRAL UNI L MOD SED  05/13/2017  . IR RADIOLOGIST EVAL & MGMT  06/08/2017  . RADIOLOGY WITH ANESTHESIA N/A 06/16/2017   Procedure: ANGIOPLASTY WITH STENTING;  Surgeon: Luanne Bras, MD;  Location: [facility];  Service: Radiology;  Laterality: N/A;    There were no vitals filed for this visit.  Subjective Assessment - 07/15/17 1559    Subjective  Patient is doing well today, no new concerns. She has been doing her HEP.    Patient is accompained by:  Family member    Pertinent History  Patinet lives alone and uses a RW if she needs it. Her children are staying  with her after her recent fall. She has lived on her own up until recently when she had a fall and hit her head needing stiches. She has 3 sons and they are rotating staying with her for safety concerns with her balance     Patient Stated Goals  Patient wants to be able to walk better.     Currently in Pain?  No/denies    Pain Score  0-No pain    Multiple Pain Sites  No         NEUROMUSCULAR RE-EDUCATION Airex NBOS eyes open with head turns x 30 seconds each x 10 Airex feet together and head turns  x 30 seconds x 5 Stool  taps alternating LE x 60 seconds; Tandem stand in // bars x 1 min x 5  Matrix fwd/ bwd x 5 reps 7. 5 lbs, side stepping x 3 with mod assist Standing on 1/2 foam flat side down and head turns x 30 sec x 10  Min cueing needed to appropriately perform balance tasks with max cues for posture.   Decreased coordination demonstrated requiring consistent verbal cueing to correct form. Cognitive understanding of task was delayed. Patient continues to demonstrate some in coordination of movement with select exercises such as side stepping.  Patient responds well to verbal and tactile cues to correct form and technique.  CGA to SBA for safety with activities.                      PT Education - 07/15/17 1559    Education provided  Yes    Education Details  safety with transfers    Person(s) Educated  Patient    Methods  Explanation    Comprehension  Verbalized understanding       PT Short Term Goals - 06/30/17 1650      PT SHORT TERM GOAL #1   Title  Patient will be independent in home exercise program to improve strength/mobility for better functional independence with ADLs    Time  4    Period  Weeks    Status  New    Target Date  07/28/17      PT SHORT TERM GOAL #2   Title  Patient (< 62 years old) will complete five times sit to stand test in < 15 seconds indicating an increased LE strength and improved balance.    Baseline  30.2 sec    Time  4     Period  Weeks    Status  New    Target Date  07/28/17        PT Long Term Goals - 06/30/17 1702      PT LONG TERM GOAL #1   Title  Patient will reduce timed up and go to <11 seconds to reduce fall risk and demonstrate improved transfer/gait ability.    Baseline  19.2 sec    Time  8    Period  Weeks    Status  New    Target Date  08/25/17      PT LONG TERM GOAL #2   Title  Patient (> 43 years old) will complete five times sit to stand test in < 15 seconds indicating an increased LE strength and improved balance.    Baseline  30.2 sec    Time  8    Period  Weeks    Status  New    Target Date  08/25/17      PT LONG TERM GOAL #3   Title  Patient will tolerate 5 seconds of single leg stance without loss of balance to improve ability to get in and out of shower safely.    Baseline  2 sec BLE    Time  8    Period  Weeks    Status  New    Target Date  08/25/17      PT LONG TERM GOAL #4   Title  Patient will increase BLE gross strength to 4+/5 as to improve functional strength for independent gait, increased standing tolerance and increased ADL ability.    Baseline  4/5 BLE hips except hip add and hip ext, ankle 3/5 BLE PF    Time  8    Period  Weeks    Status  New    Target Date  08/25/17            Plan - 07/15/17 1600    Clinical Impression Statement  Max cueing needed to appropriately  perform balance tasks with leg, hip and head position. Decreased coordination demonstrated requiring consistent verbal cueing to correct form. Cognitive understanding of task was  delayed. Motor control of LE much improved.  Muscle fatigue but no major pain complaintsPatient will continue to benefit from skilled PT  to improve balance and mobility.     Rehab Potential  Good    PT Frequency  2x / week    PT Duration  8 weeks    PT Treatment/Interventions  Patient/family education;Therapeutic exercise;Therapeutic activities;Functional mobility training;Balance training;Neuromuscular  re-education    PT Next Visit Plan  balance training and hip and ankle strengthening    PT Home Exercise Plan  heel raises, hip abd, hip ext with YTB    Consulted and Agree with Plan of Care  Patient;Family member/caregiver       Patient will benefit from skilled therapeutic intervention in order to improve the following deficits and impairments:  Abnormal gait, Decreased balance, Decreased endurance, Decreased mobility, Difficulty walking, Decreased strength  Visit Diagnosis: Muscle weakness (generalized)  Difficulty in walking, not elsewhere classified     Problem List Patient Active Problem List   Diagnosis Date Noted  . Cerebral thrombosis with cerebral infarction 06/05/2017  . Syncope 06/04/2017  . Lacunar infarction 02/20/2014  . Small vessel disease, cerebrovascular 02/20/2014  . Abnormality of gait 02/20/2014  . HYPERCHOLESTEROLEMIA  IIA 08/25/2010  . CORONARY ATHEROSCLEROSIS NATIVE CORONARY ARTERY 08/25/2010  . CHEST PAIN, PRECORDIAL 08/25/2010    Alanson Puls, PT DPT 07/15/2017, 4:41 PM  [place] MAIN [facility] SERVICES [address] [place], [place], [address] Phone: [phone]   Fax:  [phone]  Name: Tracey Hartman MRN: [id number] Date of Birth: 01/22/1930

## 2017-07-20 ENCOUNTER — Encounter: Payer: Self-pay | Admitting: Physical Therapy

## 2017-07-20 ENCOUNTER — Ambulatory Visit: Payer: Medicare Other | Admitting: Physical Therapy

## 2017-07-20 DIAGNOSIS — R262 Difficulty in walking, not elsewhere classified: Secondary | ICD-10-CM | POA: Diagnosis not present

## 2017-07-20 DIAGNOSIS — M6281 Muscle weakness (generalized): Secondary | ICD-10-CM

## 2017-07-20 NOTE — Therapy (Signed)
[place] MAIN [facility] SERVICES [address] [place], [place], [address] Phone: [phone]   Fax:  [phone]  Physical Therapy Treatment  Patient Details  Name: Tracey Hartman MRN: [id number] Date of Birth: May 13, 1930 Referring Provider: Rosalin Hawking,   Encounter Date: 07/20/2017  PT End of Session - 07/20/17 1539    Visit Number  4    Number of Visits  17    Date for PT Re-Evaluation  08/25/17    Authorization Type  4/10 g codes    PT Start Time  0332    PT Stop Time  0412    PT Time Calculation (min)  40 min    Equipment Utilized During Treatment  Gait belt    Activity Tolerance  Patient tolerated treatment well;No increased pain;Patient limited by fatigue    Behavior During Therapy  [facility] for tasks assessed/performed       Past Medical History:  Diagnosis Date  . Coronary artery disease   . Hypertension   . Myocardial infarction ([place]) 1986  . Osteoporosis   . Recurrent UTI   . Stroke [facility])     Past Surgical History:  Procedure Laterality Date  . APPENDECTOMY    . CATARACT EXTRACTION Bilateral   . COLONOSCOPY    . IR ANGIO INTRA EXTRACRAN SEL COM CAROTID INNOMINATE BILAT MOD SED  05/13/2017  . IR ANGIO VERTEBRAL SEL SUBCLAVIAN INNOMINATE UNI L MOD SED  06/16/2017  . IR ANGIO VERTEBRAL SEL SUBCLAVIAN INNOMINATE UNI R MOD SED  05/13/2017  . IR ANGIO VERTEBRAL SEL VERTEBRAL UNI L MOD SED  05/13/2017  . IR RADIOLOGIST EVAL & MGMT  06/08/2017  . RADIOLOGY WITH ANESTHESIA N/A 06/16/2017   Procedure: ANGIOPLASTY WITH STENTING;  Surgeon: Luanne Bras, MD;  Location: [place];  Service: Radiology;  Laterality: N/A;    There were no vitals filed for this visit.  Subjective Assessment - 07/20/17 1538    Subjective  Patient is doing well today, no new concerns. She has been doing her HEP.    Patient is accompained by:  Family member    Pertinent History  Patinet lives alone and uses a RW if she needs it. Her children are staying  with her after her recent fall. She has lived on her own up until recently when she had a fall and hit her head needing stiches. She has 3 sons and they are rotating staying with her for safety concerns with her balance     Patient Stated Goals  Patient wants to be able to walk better.     Currently in Pain?  No/denies    Pain Score  0-No pain    Multiple Pain Sites  No       TREATMENT Therapeutic exercise; BOSU ball lunges x 20 x 2 slde stepping on TM left and right with . 4 Miles / hour 4 square fwd/bwd/ side stepping left and right/ diagonals Star stepping left and right x 10 Tapping on steps x 20  Ascending/descending steps without railing  Side stepping with RTB x 4 lengths Squats x 10 x 2 Heel raises x 10 x 2.  THER-EX Octane fitness x 5 minutes  Quantum double leg press  75 lbs x 15 x 3 Heel raises with UE support and toes on 2x4, 2 x 10; Mini squats with RTB around knees to prevent valgus 2 x 10; RTB side stepping in // bars 4 lengths x 2; Min assist and CGA required for balance  and mod verbal cues for correct posture and techniques during exercises and balance challenges.                       PT Education - 07/20/17 1539    Education provided  Yes    Education Details  exercises and balance    Person(s) Educated  Patient    Methods  Explanation    Comprehension  Verbalized understanding       PT Short Term Goals - 06/30/17 1650      PT SHORT TERM GOAL #1   Title  Patient will be independent in home exercise program to improve strength/mobility for better functional independence with ADLs    Time  4    Period  Weeks    Status  New    Target Date  07/28/17      PT SHORT TERM GOAL #2   Title  Patient (< 40 years old) will complete five times sit to stand test in < 15 seconds indicating an increased LE strength and improved balance.    Baseline  30.2 sec    Time  4    Period  Weeks    Status  New    Target Date  07/28/17        PT  Long Term Goals - 06/30/17 1702      PT LONG TERM GOAL #1   Title  Patient will reduce timed up and go to <11 seconds to reduce fall risk and demonstrate improved transfer/gait ability.    Baseline  19.2 sec    Time  8    Period  Weeks    Status  New    Target Date  08/25/17      PT LONG TERM GOAL #2   Title  Patient (> 15 years old) will complete five times sit to stand test in < 15 seconds indicating an increased LE strength and improved balance.    Baseline  30.2 sec    Time  8    Period  Weeks    Status  New    Target Date  08/25/17      PT LONG TERM GOAL #3   Title  Patient will tolerate 5 seconds of single leg stance without loss of balance to improve ability to get in and out of shower safely.    Baseline  2 sec BLE    Time  8    Period  Weeks    Status  New    Target Date  08/25/17      PT LONG TERM GOAL #4   Title  Patient will increase BLE gross strength to 4+/5 as to improve functional strength for independent gait, increased standing tolerance and increased ADL ability.    Baseline  4/5 BLE hips except hip add and hip ext, ankle 3/5 BLE PF    Time  8    Period  Weeks    Status  New    Target Date  08/25/17            Plan - 07/20/17 1540    Clinical Impression Statement  Pt requires redirection and verbal cues for correct performance of exercises. Patient demonstrates LOB with standing balance exercises indicating decreased balancing strategies. Pt was able to progress dynamic balance exercises today, noting improved postural reactions with LOB and moving outside normal BOS.  Pt was able to perform all exercises on uneven surfaces with min assist. Patient struggles with  speed during movement as well as balance with unstable surfaces. Pt encouraged to continue HEP .Follow-up as scheduled.    Rehab Potential  Good    PT Frequency  2x / week    PT Duration  8 weeks    PT Treatment/Interventions  Patient/family education;Therapeutic exercise;Therapeutic  activities;Functional mobility training;Balance training;Neuromuscular re-education    PT Next Visit Plan  balance training and hip and ankle strengthening    PT Home Exercise Plan  heel raises, hip abd, hip ext with YTB    Consulted and Agree with Plan of Care  Patient;Family member/caregiver       Patient will benefit from skilled therapeutic intervention in order to improve the following deficits and impairments:  Abnormal gait, Decreased balance, Decreased endurance, Decreased mobility, Difficulty walking, Decreased strength  Visit Diagnosis: Muscle weakness (generalized)  Difficulty in walking, not elsewhere classified     Problem List Patient Active Problem List   Diagnosis Date Noted  . Cerebral thrombosis with cerebral infarction 06/05/2017  . Syncope 06/04/2017  . Lacunar infarction 02/20/2014  . Small vessel disease, cerebrovascular 02/20/2014  . Abnormality of gait 02/20/2014  . HYPERCHOLESTEROLEMIA  IIA 08/25/2010  . CORONARY ATHEROSCLEROSIS NATIVE CORONARY ARTERY 08/25/2010  . CHEST PAIN, PRECORDIAL 08/25/2010    Alanson Puls, PT DPT 07/20/2017, 3:44 PM  [place] MAIN [facility] SERVICES [address] [place], [place], [address] Phone: [phone]   Fax:  [phone]  Name: Tracey Hartman MRN: [id number] Date of Birth: 20-Jun-1930

## 2017-07-21 ENCOUNTER — Encounter: Payer: Self-pay | Admitting: Neurology

## 2017-07-21 ENCOUNTER — Ambulatory Visit (INDEPENDENT_AMBULATORY_CARE_PROVIDER_SITE_OTHER): Payer: Medicare Other | Admitting: Neurology

## 2017-07-21 VITALS — BP 157/93 | HR 58 | Wt 144.0 lb

## 2017-07-21 DIAGNOSIS — I6381 Other cerebral infarction due to occlusion or stenosis of small artery: Secondary | ICD-10-CM

## 2017-07-21 DIAGNOSIS — I6330 Cerebral infarction due to thrombosis of unspecified cerebral artery: Secondary | ICD-10-CM | POA: Diagnosis not present

## 2017-07-21 NOTE — Progress Notes (Signed)
[facility] [address] [place]. [place] [address] [phone]       OFFICE FOLLOW UP VISIT NOTE  Tracey. Tracey Hartman Date of Birth:  20-Sep-1929 Medical Record Number:  [id number]   Referring MD:   self  Reason for Referral:  Memory and balance difficulties  HPI: Initial consult 03/01/17 : Tracey Hartman is a pleasant 83 year old Caucasian lady who is accompanied today by her son and granddaughter. She has been having some memory and cognitive difficulties for several months. She attributes this due to significant stress that she is going through. She is in the process of selling her house and is currently building a house in [place] where she plans to move to be close to her family. 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 She had times feels she has trouble remembering recent information as well as at times finding words and completing sentences. Her son feels that her communication is not as crisp as it used to be and she has trouble reading words out and cannot think and sharply. Patient is still independent and manages all activities of daily living. She denies any significant headaches, head injury with loss of consciousness, seizures. She has a prior history of small stroke in July 2015 when she saw me for episode of gait ataxia and some mild right-sided weakness which lasted several days this event and MRI scan of the brain done on 01/14/2014 which I personally reviewed and shows moderate changes of small vessel disease and several tiny microhemorrhages on gradient echo images. Intracranial and extracranial vascular imaging has not been performed and echo has also not been done. Hemoglobin A1c was borderline at 6.3 and lipid profile showed LDL of 94 mg percent She is on aspirin daily which is tolerating well without side effects. She is also on pravastatin and is tolerating it well without muscle aches or pains. She also takes fish oil  daily. She's had some mild balance difficulties for long time but feels awful 8 when she makes a sudden turn or tries to walk quickly she is insecure. She is a had a couple of minor falls but has not had fortunately any major injuries. She denies any tingling numbness burning or pain in her feet. She denies feeling depressed but does admit to some anxiety due to her move. There is no history of episodes of confusion, disorientation. She still driving and has never gotten lost. There is no history of delusions, hallucinations or agitation or abnormal behavior Update 04/28/2017 ; she returns for follow-up after last visit 2 months ago. She is accompanied by her son and granddaughter. Patient states she is doing well she's had no stroke or TIA symptoms. She continues to have dizziness and imbalance. She is careful if she gets gets up slowly but if she makes sudden movements she is more off-balance. She is at no falls or injuries. She did undergo MRI scan of the brain on 03/11/17 which I personally reviewed shows 2 subacute small white matter infarcts on either side which appear to be lacunar in nature. There are also remote age lacunar infarcts. CT angiogram of the brain and neck was performed on 04/20/17 which shows severely calcified bulky plaque extends stenosis involving the right brachiocephalic and left subclavian arteries. There was a tiny 2 mm posterior right internal carotid artery terminus aneurysm or infundibulum. I personally reviewed the imaging films and discuss with the family. Patient denies any symptoms of vertebrobasilar  steal in the form of dizziness and lightheadedness with arms raised about the shoulder. All memory panel labs done on 03/01/17 were normal. Update 07/21/2017 : She returns for follow-up after recent hospital admission on 06/04/2017 with episode of loss of consciousness and fall. She is accompanied by her granddaughter and son who provide history. I have personally reviewed electronic  medical records and imaging films. She was emergently admitted for consideration for subclavian steal syndrome given the fact that cerebral catheter angiogram in October 2018 had shown 85% proximal left subclavian stenosis. Patient did complain of confusion and vertical diplopia on admission. She had some amnesia after her fall. MRI scan of the brain done on 06/04/17 showed small right cerebral peduncle and right thalamus punctate lacunar infarct. She underwent cerebral catheter angiogram on 06/06/17 by Dr. Estanislado Pandy but it showed only 65% proximal left subclavian stenosis without clear evidence of subclavian steal. Hence it was decided to treat her medically and subclavian stenting was not performed. Patient was seen by physical occupational therapy and has been getting therapy. She is doing better now she can walk short distances independently. Her blood pressure has been pretty good she is brought with her today her blood pressure log mostly transcend 110 to 130s. Today it is slightly elevated in my office at 157/93. She was changed from Lipitor to Pravachol as she had some side effects. She continues to have mild short-term memory difficulties but these have not progressive. She does not parts. In cognitively challenging activities. At last office visit in September she had scored 29/30 on the Mini-Mental status exam. ROS:   14 system review of systems is positive for  memory loss,  , , imbalance, walking difficulty and all other systems negative  PMH:  Past Medical History:  Diagnosis Date  . Coronary artery disease   . Hypertension   . Myocardial infarction ([place]) 1986  . Osteoporosis   . Recurrent UTI   . Stroke [facility])     Social History:  Social History   Socioeconomic History  . Marital status: Married    Spouse name: Not on file  . Number of children: 4  . Years of education: 12th  . Highest education level: Not on file  Social Needs  . Financial resource strain: Not on file  . Food  insecurity - worry: Not on file  . Food insecurity - inability: Not on file  . Transportation needs - medical: Not on file  . Transportation needs - non-medical: Not on file  Occupational History  . Occupation: Therapist, music: [employer]  Tobacco Use  . Smoking status: Former Research scientist (life sciences)  . Smokeless tobacco: Never Used  Substance and Sexual Activity  . Alcohol use: No  . Drug use: No  . Sexual activity: No  Other Topics Concern  . Not on file  Social History Narrative   Patient lives at home alone    Patient is right handed   Patient drinks coffee daily    Medications:   Current Outpatient Medications on File Prior to Visit  Medication Sig Dispense Refill  . aspirin EC 81 MG tablet Take 1 tablet (81 mg total) by mouth daily.    . Biotin 10 MG TABS Take 1 tablet by mouth daily.    . Calcium Carbonate-Vitamin D 600-400 MG-UNIT tablet Take 2 tablets by mouth daily with breakfast.     . clopidogrel (PLAVIX) 75 MG tablet Take 1 tablet (75 mg total) by mouth daily. 30 tablet 11  .  Cranberry 1000 MG CAPS Take 1,000 mg by mouth daily.    Marland Kitchen GARLIC PO Take 1 capsule by mouth daily.    . metoprolol tartrate (LOPRESSOR) 50 MG tablet Take 25 mg daily by mouth.    . Multiple Vitamins-Minerals (MULTIVITAMIN WITH MINERALS) tablet Take 1 tablet by mouth daily.    . Omega-3 Fatty Acids (FISH OIL) 1200 MG CAPS Take 2 capsules by mouth daily.     . pravastatin (PRAVACHOL) 80 MG tablet Take 80 mg by mouth daily.    Marland Kitchen trimethoprim (TRIMPEX) 100 MG tablet Take 100 mg by mouth daily.      No current facility-administered medications on file prior to visit.     Allergies:   Allergies  Allergen Reactions  . Codeine     UNSPECIFIED REACTION     Physical Exam General: well developed, well nourished, pleasant elderly Caucasian lady seated, in no evident distress Head: head normocephalic and atraumatic.   Neck: supple with no carotid or supraclavicular bruits Cardiovascular: regular rate  and rhythm, no murmurs Musculoskeletal: no deformity Skin:  no rash/petichiae Vascular:  Normal pulses all extremities.Symmetric bilateral radial pulses at rest but Adson's test is positive on the left.  Neurologic Exam Mental Status: Awake and fully alert. Oriented to place and time. Recent and remote memory intact. Attention span, concentration and fund of knowledge appropriate. Mood and affect appropriate. Diminished recall 2/3 Animal naming only 5. Geriatric depression scale not done. Cranial Nerves: Fundoscopic exam reveals sharp disc margins. Pupils equal, briskly reactive to light. Extraocular movements full without nystagmus. Visual fields full to confrontation. Hearing intact. Facial sensation intact. Face, tongue, palate moves normally and symmetrically.  Motor: Normal bulk and tone. Normal strength in all tested extremity muscles. Sensory.: intact to touch , pinprick , position and vibratory sensation.  Coordination: Rapid alternating movements normal in all extremities. Finger-to-nose and heel-to-shin performed accurately bilaterally. Gait and Station: Arises from chair without difficulty. Stance is slightly broad-based. Gait demonstrates normal stride length and mild imbalance .   Reflexes: 1+ and symmetric. Toes downgoing.       ASSESSMENT: 54 year Caucasian lady with mild memory and cognitive difficulties likely from mild cognitive impairment. Mild gait and balance difficulties likely multifactorial secondary to age-related small vessel disease and prior strokes . Recurrent small right brainstem and thalamic infarcts in November 2018 likely from small vessel disease. Cerebral angiogram shows moderate proximal left subclavian stenosis but clinical exam and history do not support subclavian steal  PLAN: I had a long d/w patient about his recent brainstem strokes, moderate subclavian stenosis, risk for recurrent stroke/TIAs, personally independently reviewed imaging studies and  stroke evaluation results and answered questions.Continue aspirin and Plavix for 3 months and then discontinue aspirin and stay on Plavix alone for secondary stroke prevention and maintain strict control of hypertension with blood pressure goal below 130/90, diabetes with hemoglobin A1c goal below 6.5% and lipids with LDL cholesterol goal below 70 mg/dL. I also advised the patient to eat a healthy diet with plenty of whole grains, cereals, fruits and vegetables, exercise regularly and maintain ideal body weight .we also talked about fall and safety prevention precautions. I also advised her to participate in mentally challenging activities like solving crossword puzzles, playing sudoku and brain games to help with the mild cognitive impairment We also discussed symptoms of subclavian steal and advised the patient to call me if she develops these. Followup in the future with my nurse practitioner Janett Billow in 6 months or call earlier if  necessary Greater than 50% time during this 30 minute visit was spent on counseling and coordination of care about her son and strokes, mild cognitive impairment, need to do further diagnostic evaluation and discuss treatment options including angioplasty stenting and answering questions. Antony Contras, MD  [facility] [address] [place], [place] [address]  Phone [phone] Fax [phone] Note: This document was prepared with digital dictation and possible smart phrase technology. Any transcriptional errors that result from this process are unintentional.

## 2017-07-21 NOTE — Patient Instructions (Signed)
I had a long d/w patient about his recent brainstem strokes, moderate subclavian stenosis, risk for recurrent stroke/TIAs, personally independently reviewed imaging studies and stroke evaluation results and answered questions.Continue aspirin and Plavix for 3 months and then discontinue aspirin and stay on Plavix alone for secondary stroke prevention and maintain strict control of hypertension with blood pressure goal below 130/90, diabetes with hemoglobin A1c goal below 6.5% and lipids with LDL cholesterol goal below 70 mg/dL. I also advised the patient to eat a healthy diet with plenty of whole grains, cereals, fruits and vegetables, exercise regularly and maintain ideal body weight .we also talked about fall and safety prevention precautions. We also discussed symptoms of subclavian steal and advised the patient to call me if she develops these. Followup in the future with my nurse practitioner Janett Billow in 6 months or call earlier if necessary  Fall Prevention in the Home Falls can cause injuries. They can happen to people of all ages. There are many things you can do to make your home safe and to help prevent falls. What can I do on the outside of my home?  Regularly fix the edges of walkways and driveways and fix any cracks.  Remove anything that might make you trip as you walk through a door, such as a raised step or threshold.  Trim any bushes or trees on the path to your home.  Use bright outdoor lighting.  Clear any walking paths of anything that might make someone trip, such as rocks or tools.  Regularly check to see if handrails are loose or broken. Make sure that both sides of any steps have handrails.  Any raised decks and porches should have guardrails on the edges.  Have any leaves, snow, or ice cleared regularly.  Use sand or salt on walking paths during winter.  Clean up any spills in your garage right away. This includes oil or grease spills. What can I do in the  bathroom?  Use night lights.  Install grab bars by the toilet and in the tub and shower. Do not use towel bars as grab bars.  Use non-skid mats or decals in the tub or shower.  If you need to sit down in the shower, use a plastic, non-slip stool.  Keep the floor dry. Clean up any water that spills on the floor as soon as it happens.  Remove soap buildup in the tub or shower regularly.  Attach bath mats securely with double-sided non-slip rug tape.  Do not have throw rugs and other things on the floor that can make you trip. What can I do in the bedroom?  Use night lights.  Make sure that you have a light by your bed that is easy to reach.  Do not use any sheets or blankets that are too big for your bed. They should not hang down onto the floor.  Have a firm chair that has side arms. You can use this for support while you get dressed.  Do not have throw rugs and other things on the floor that can make you trip. What can I do in the kitchen?  Clean up any spills right away.  Avoid walking on wet floors.  Keep items that you use a lot in easy-to-reach places.  If you need to reach something above you, use a strong step stool that has a grab bar.  Keep electrical cords out of the way.  Do not use floor polish or wax that makes floors slippery. If  you must use wax, use non-skid floor wax.  Do not have throw rugs and other things on the floor that can make you trip. What can I do with my stairs?  Do not leave any items on the stairs.  Make sure that there are handrails on both sides of the stairs and use them. Fix handrails that are broken or loose. Make sure that handrails are as long as the stairways.  Check any carpeting to make sure that it is firmly attached to the stairs. Fix any carpet that is loose or worn.  Avoid having throw rugs at the top or bottom of the stairs. If you do have throw rugs, attach them to the floor with carpet tape.  Make sure that you have a  light switch at the top of the stairs and the bottom of the stairs. If you do not have them, ask someone to add them for you. What else can I do to help prevent falls?  Wear shoes that: ? Do not have high heels. ? Have rubber bottoms. ? Are comfortable and fit you well. ? Are closed at the toe. Do not wear sandals.  If you use a stepladder: ? Make sure that it is fully opened. Do not climb a closed stepladder. ? Make sure that both sides of the stepladder are locked into place. ? Ask someone to hold it for you, if possible.  Clearly mark and make sure that you can see: ? Any grab bars or handrails. ? First and last steps. ? Where the edge of each step is.  Use tools that help you move around (mobility aids) if they are needed. These include: ? Canes. ? Walkers. ? Scooters. ? Crutches.  Turn on the lights when you go into a dark area. Replace any light bulbs as soon as they burn out.  Set up your furniture so you have a clear path. Avoid moving your furniture around.  If any of your floors are uneven, fix them.  If there are any pets around you, be aware of where they are.  Review your medicines with your doctor. Some medicines can make you feel dizzy. This can increase your chance of falling. Ask your doctor what other things that you can do to help prevent falls. This information is not intended to replace advice given to you by your health care provider. Make sure you discuss any questions you have with your health care provider. Document Released: 05/16/2009 Document Revised: 12/26/2015 Document Reviewed: 08/24/2014 Elsevier Interactive Patient Education  [employer].

## 2017-07-22 ENCOUNTER — Ambulatory Visit: Payer: Medicare Other | Admitting: Physical Therapy

## 2017-07-22 ENCOUNTER — Encounter: Payer: Self-pay | Admitting: Physical Therapy

## 2017-07-22 DIAGNOSIS — M6281 Muscle weakness (generalized): Secondary | ICD-10-CM

## 2017-07-22 DIAGNOSIS — R262 Difficulty in walking, not elsewhere classified: Secondary | ICD-10-CM

## 2017-07-22 NOTE — Therapy (Signed)
[place] MAIN [facility] SERVICES [address] [place], [place], [address] Phone: [phone]   Fax:  [phone]  Physical Therapy Treatment  Patient Details  Name: Tracey Hartman MRN: [id number] Date of Birth: Jul 05, 1930 Referring Provider: Rosalin Hawking,   Encounter Date: 07/22/2017  PT End of Session - 07/22/17 1437    Visit Number  5    Number of Visits  17    Date for PT Re-Evaluation  08/25/17    Authorization Type  5/10 g codes    PT Start Time  0233    PT Stop Time  0315    PT Time Calculation (min)  42 min    Equipment Utilized During Treatment  Gait belt    Activity Tolerance  Patient tolerated treatment well;No increased pain;Patient limited by fatigue    Behavior During Therapy  [facility] for tasks assessed/performed       Past Medical History:  Diagnosis Date  . Coronary artery disease   . Hypertension   . Myocardial infarction ([place]) 1986  . Osteoporosis   . Recurrent UTI   . Stroke [facility])     Past Surgical History:  Procedure Laterality Date  . APPENDECTOMY    . CATARACT EXTRACTION Bilateral   . COLONOSCOPY    . IR ANGIO INTRA EXTRACRAN SEL COM CAROTID INNOMINATE BILAT MOD SED  05/13/2017  . IR ANGIO VERTEBRAL SEL SUBCLAVIAN INNOMINATE UNI L MOD SED  06/16/2017  . IR ANGIO VERTEBRAL SEL SUBCLAVIAN INNOMINATE UNI R MOD SED  05/13/2017  . IR ANGIO VERTEBRAL SEL VERTEBRAL UNI L MOD SED  05/13/2017  . IR RADIOLOGIST EVAL & MGMT  06/08/2017  . RADIOLOGY WITH ANESTHESIA N/A 06/16/2017   Procedure: ANGIOPLASTY WITH STENTING;  Surgeon: Luanne Bras, MD;  Location: [facility];  Service: Radiology;  Laterality: N/A;    There were no vitals filed for this visit.  Subjective Assessment - 07/22/17 1436    Subjective  Patient is doing well today, no new concerns. She has been doing her HEP.    Patient is accompained by:  Family member    Pertinent History  Patinet lives alone and uses a RW if she needs it. Her children are staying  with her after her recent fall. She has lived on her own up until recently when she had a fall and hit her head needing stiches. She has 3 sons and they are rotating staying with her for safety concerns with her balance     Patient Stated Goals  Patient wants to be able to walk better.     Currently in Pain?  No/denies    Pain Score  0-No pain       NEUROMUSCULAR RE-ED Gait with horizontal and vertical head turns 80' x 2;fast stops and 180 deg turns , CGA Standing slow marching without UE support 2 x 10;, CGA Toe taps from blue foam to stool  x 10 bilateral, alternating;CGA Step ups from blue foam to stool x 20 ,CGA Side step ups from blue foam to 6 inch stool left and right x20,CGA Tandem gait in // bars x 4 laps,CGA Matrix side stepping / fwd/ bwd stepping with min assist  Leg press 75 lbs x 20 x 2 CGA and Min to mod verbal cues used throughout with increased in postural sway and LOB most seen with narrow base of support and while on uneven surfaces. Continues to have balance deficits typical with diagnosis. Patient performs intermediate level exercises without pain behaviors  and needs verbal cuing for postural alignment and head positioning                       PT Education - 07/22/17 1437    Education provided  Yes    Education Details  xercise technique    Person(s) Educated  Patient    Methods  Explanation;Demonstration;Tactile cues    Comprehension  Verbalized understanding;Returned demonstration       PT Short Term Goals - 06/30/17 1650      PT SHORT TERM GOAL #1   Title  Patient will be independent in home exercise program to improve strength/mobility for better functional independence with ADLs    Time  4    Period  Weeks    Status  New    Target Date  07/28/17      PT SHORT TERM GOAL #2   Title  Patient (< 97 years old) will complete five times sit to stand test in < 15 seconds indicating an increased LE strength and improved balance.    Baseline   30.2 sec    Time  4    Period  Weeks    Status  New    Target Date  07/28/17        PT Long Term Goals - 06/30/17 1702      PT LONG TERM GOAL #1   Title  Patient will reduce timed up and go to <11 seconds to reduce fall risk and demonstrate improved transfer/gait ability.    Baseline  19.2 sec    Time  8    Period  Weeks    Status  New    Target Date  08/25/17      PT LONG TERM GOAL #2   Title  Patient (> 9 years old) will complete five times sit to stand test in < 15 seconds indicating an increased LE strength and improved balance.    Baseline  30.2 sec    Time  8    Period  Weeks    Status  New    Target Date  08/25/17      PT LONG TERM GOAL #3   Title  Patient will tolerate 5 seconds of single leg stance without loss of balance to improve ability to get in and out of shower safely.    Baseline  2 sec BLE    Time  8    Period  Weeks    Status  New    Target Date  08/25/17      PT LONG TERM GOAL #4   Title  Patient will increase BLE gross strength to 4+/5 as to improve functional strength for independent gait, increased standing tolerance and increased ADL ability.    Baseline  4/5 BLE hips except hip add and hip ext, ankle 3/5 BLE PF    Time  8    Period  Weeks    Status  New    Target Date  08/25/17            Plan - 07/22/17 1438    Clinical Impression Statement  Patient required min verbal cueing during matrix machine stepping, and required CGA during all dynamic standing balance activities. Patient required occasional rest breaks between exercises due to fatigue. Patient tolerated exercise well. Patient will continue to benefit from skilled therapy in order to improve dynamic standing balance activities and increase gait speed to reduce risk for falls    Rehab Potential  Good    PT Frequency  2x / week    PT Duration  8 weeks    PT Treatment/Interventions  Patient/family education;Therapeutic exercise;Therapeutic activities;Functional mobility  training;Balance training;Neuromuscular re-education    PT Next Visit Plan  balance training and hip and ankle strengthening    PT Home Exercise Plan  heel raises, hip abd, hip ext with YTB    Consulted and Agree with Plan of Care  Patient;Family member/caregiver       Patient will benefit from skilled therapeutic intervention in order to improve the following deficits and impairments:  Abnormal gait, Decreased balance, Decreased endurance, Decreased mobility, Difficulty walking, Decreased strength  Visit Diagnosis: Muscle weakness (generalized)  Difficulty in walking, not elsewhere classified     Problem List Patient Active Problem List   Diagnosis Date Noted  . Cerebral thrombosis with cerebral infarction 06/05/2017  . Syncope 06/04/2017  . Lacunar infarction 02/20/2014  . Small vessel disease, cerebrovascular 02/20/2014  . Abnormality of gait 02/20/2014  . HYPERCHOLESTEROLEMIA  IIA 08/25/2010  . CORONARY ATHEROSCLEROSIS NATIVE CORONARY ARTERY 08/25/2010  . CHEST PAIN, PRECORDIAL 08/25/2010    Alanson Puls, PT DPT 07/22/2017, 2:39 PM  [place] MAIN [facility] SERVICES [address] [place], [place], [address] Phone: [phone]   Fax:  [phone]  Name: Tracey Hartman MRN: [id number] Date of Birth: 1929/09/24

## 2017-07-29 ENCOUNTER — Ambulatory Visit: Payer: Medicare Other | Admitting: Physical Therapy

## 2017-07-29 ENCOUNTER — Encounter: Payer: Self-pay | Admitting: Physical Therapy

## 2017-07-29 DIAGNOSIS — R262 Difficulty in walking, not elsewhere classified: Secondary | ICD-10-CM

## 2017-07-29 DIAGNOSIS — M6281 Muscle weakness (generalized): Secondary | ICD-10-CM

## 2017-07-29 NOTE — Therapy (Signed)
[place] MAIN [facility] SERVICES [address] [place], [place], [address] Phone: [phone]   Fax:  [phone]  Physical Therapy Treatment  Patient Details  Name: Tracey Hartman MRN: [id number] Date of Birth: Mar 12, 1930 Referring Provider: Rosalin Hawking,   Encounter Date: 07/29/2017  PT End of Session - 07/29/17 1450    Visit Number  6    Number of Visits  17    Date for PT Re-Evaluation  08/25/17    Authorization Type  6/10 g codes    PT Start Time  1324    PT Stop Time  0330    PT Time Calculation (min)  45 min    Equipment Utilized During Treatment  Gait belt    Activity Tolerance  Patient tolerated treatment well;No increased pain;Patient limited by fatigue    Behavior During Therapy  [facility] for tasks assessed/performed       Past Medical History:  Diagnosis Date  . Coronary artery disease   . Hypertension   . Myocardial infarction ([place]) 1986  . Osteoporosis   . Recurrent UTI   . Stroke [facility])     Past Surgical History:  Procedure Laterality Date  . APPENDECTOMY    . CATARACT EXTRACTION Bilateral   . COLONOSCOPY    . IR ANGIO INTRA EXTRACRAN SEL COM CAROTID INNOMINATE BILAT MOD SED  05/13/2017  . IR ANGIO VERTEBRAL SEL SUBCLAVIAN INNOMINATE UNI L MOD SED  06/16/2017  . IR ANGIO VERTEBRAL SEL SUBCLAVIAN INNOMINATE UNI R MOD SED  05/13/2017  . IR ANGIO VERTEBRAL SEL VERTEBRAL UNI L MOD SED  05/13/2017  . IR RADIOLOGIST EVAL & MGMT  06/08/2017  . RADIOLOGY WITH ANESTHESIA N/A 06/16/2017   Procedure: ANGIOPLASTY WITH STENTING;  Surgeon: Luanne Bras, MD;  Location: [place];  Service: Radiology;  Laterality: N/A;    There were no vitals filed for this visit.  Subjective Assessment - 07/29/17 1448    Subjective  Patient is doing ok today , her right  foot has a sore toe,  she is not sure what has made it sore. She has been doing her HEP.    Patient is accompained by:  Family member    Pertinent History  Patinet lives alone and  uses a RW if she needs it. Her children are staying with her after her recent fall. She has lived on her own up until recently when she had a fall and hit her head needing stiches. She has 3 sons and they are rotating staying with her for safety concerns with her balance     Patient Stated Goals  Patient wants to be able to walk better.     Currently in Pain?  Yes    Pain Score  5     Pain Location  Toe (Comment which one)    Pain Orientation  Right    Pain Descriptors / Indicators  Sore    Pain Type  Acute pain    Pain Onset  Today    Aggravating Factors   bumps it    Effect of Pain on Daily Activities  minimal    Multiple Pain Sites  No       There ex: Leg press with 60#x 20 x 2, followed by 75 lbs x20 x 2 Leg press with heel raises at 60 lbs x 20 x 2  sit to stand 2x10   Mini squats in //bars x 15 x 2  Eccentric step downs from 6 inch stool  x 5 left and right x 4 sets  Single leg bridge x 5 Left and right , difficult to raise up buttocks Hip abd sidelying x 15 x 2 Hip SLR x 15 x 2  Bridging x 15 x 2 Unable to perform outcome measures today due to right toe pain. Will review goals next session.                        PT Education - 07/29/17 1450    Education provided  Yes    Education Details  HEP    Person(s) Educated  Patient    Methods  Explanation;Demonstration    Comprehension  Verbalized understanding       PT Short Term Goals - 06/30/17 1650      PT SHORT TERM GOAL #1   Title  Patient will be independent in home exercise program to improve strength/mobility for better functional independence with ADLs    Time  4    Period  Weeks    Status  New    Target Date  07/28/17      PT SHORT TERM GOAL #2   Title  Patient (< 81 years old) will complete five times sit to stand test in < 15 seconds indicating an increased LE strength and improved balance.    Baseline  30.2 sec    Time  4    Period  Weeks    Status  New    Target Date  07/28/17         PT Long Term Goals - 06/30/17 1702      PT LONG TERM GOAL #1   Title  Patient will reduce timed up and go to <11 seconds to reduce fall risk and demonstrate improved transfer/gait ability.    Baseline  19.2 sec    Time  8    Period  Weeks    Status  New    Target Date  08/25/17      PT LONG TERM GOAL #2   Title  Patient (> 81 years old) will complete five times sit to stand test in < 15 seconds indicating an increased LE strength and improved balance.    Baseline  30.2 sec    Time  8    Period  Weeks    Status  New    Target Date  08/25/17      PT LONG TERM GOAL #3   Title  Patient will tolerate 5 seconds of single leg stance without loss of balance to improve ability to get in and out of shower safely.    Baseline  2 sec BLE    Time  8    Period  Weeks    Status  New    Target Date  08/25/17      PT LONG TERM GOAL #4   Title  Patient will increase BLE gross strength to 4+/5 as to improve functional strength for independent gait, increased standing tolerance and increased ADL ability.    Baseline  4/5 BLE hips except hip add and hip ext, ankle 3/5 BLE PF    Time  8    Period  Weeks    Status  New    Target Date  08/25/17            Plan - 07/29/17 1451    Clinical Impression Statement  Pt requires redirection and verbal cues for correct performance of exercises. Patient demonstrates LOB with standing  balance exercises indicating decreased balancing strategies. Pt was able to progress dynamic balance exercises today, noting improved postural reactions with LOB and moving outside normal BOS.  Pt was able to perform all exercises on uneven surfaces with min assist. Patient struggles with speed during movement as well as balance with unstable surfaces. Pt encouraged to continue HEP .Follow-up as scheduled.    Rehab Potential  Good    PT Frequency  2x / week    PT Duration  8 weeks    PT Treatment/Interventions  Patient/family education;Therapeutic  exercise;Therapeutic activities;Functional mobility training;Balance training;Neuromuscular re-education    PT Next Visit Plan  balance training and hip and ankle strengthening    PT Home Exercise Plan  heel raises, hip abd, hip ext with YTB    Consulted and Agree with Plan of Care  Patient;Family member/caregiver       Patient will benefit from skilled therapeutic intervention in order to improve the following deficits and impairments:  Abnormal gait, Decreased balance, Decreased endurance, Decreased mobility, Difficulty walking, Decreased strength  Visit Diagnosis: Muscle weakness (generalized)  Difficulty in walking, not elsewhere classified     Problem List Patient Active Problem List   Diagnosis Date Noted  . Cerebral thrombosis with cerebral infarction 06/05/2017  . Syncope 06/04/2017  . Lacunar infarction 02/20/2014  . Small vessel disease, cerebrovascular 02/20/2014  . Abnormality of gait 02/20/2014  . HYPERCHOLESTEROLEMIA  IIA 08/25/2010  . CORONARY ATHEROSCLEROSIS NATIVE CORONARY ARTERY 08/25/2010  . CHEST PAIN, PRECORDIAL 08/25/2010    Alanson Puls, PT  DPT 07/29/2017, 2:52 PM  [place] MAIN [facility] SERVICES [address] , [place], [address] Phone: [phone]   Fax:  [phone]  Name: BAYLIE DRAKES MRN: [id number] Date of Birth: 1929-10-03

## 2017-08-02 ENCOUNTER — Ambulatory Visit: Payer: Medicare Other | Admitting: Physical Therapy

## 2017-08-04 ENCOUNTER — Ambulatory Visit: Payer: Medicare Other | Admitting: Physical Therapy

## 2017-08-04 DIAGNOSIS — J069 Acute upper respiratory infection, unspecified: Secondary | ICD-10-CM | POA: Diagnosis not present

## 2017-08-09 ENCOUNTER — Ambulatory Visit: Payer: Medicare Other | Attending: Family Medicine | Admitting: Physical Therapy

## 2017-08-09 ENCOUNTER — Encounter: Payer: Self-pay | Admitting: Physical Therapy

## 2017-08-09 DIAGNOSIS — R262 Difficulty in walking, not elsewhere classified: Secondary | ICD-10-CM | POA: Insufficient documentation

## 2017-08-09 DIAGNOSIS — R41841 Cognitive communication deficit: Secondary | ICD-10-CM | POA: Diagnosis not present

## 2017-08-09 DIAGNOSIS — R269 Unspecified abnormalities of gait and mobility: Secondary | ICD-10-CM | POA: Insufficient documentation

## 2017-08-09 DIAGNOSIS — M6281 Muscle weakness (generalized): Secondary | ICD-10-CM | POA: Diagnosis not present

## 2017-08-09 NOTE — Therapy (Signed)
[place] MAIN [facility] SERVICES [address] [place], [place], [address] Phone: [phone]   Fax:  [phone]  Physical Therapy Treatment  Patient Details  Name: Tracey Hartman MRN: [id number] Date of Birth: 1930/04/13 Referring Provider: Rosalin Hawking,   Encounter Date: 08/09/2017  PT End of Session - 08/09/17 1538    Visit Number  7    Number of Visits  17    Date for PT Re-Evaluation  08/25/17    PT Start Time  0335    PT Stop Time  0400    PT Time Calculation (min)  25 min    Equipment Utilized During Treatment  Gait belt    Activity Tolerance  Patient tolerated treatment well;No increased pain;Patient limited by fatigue    Behavior During Therapy  [facility] for tasks assessed/performed       Past Medical History:  Diagnosis Date  . Coronary artery disease   . Hypertension   . Myocardial infarction ([place]) 1986  . Osteoporosis   . Recurrent UTI   . Stroke [facility])     Past Surgical History:  Procedure Laterality Date  . APPENDECTOMY    . CATARACT EXTRACTION Bilateral   . COLONOSCOPY    . IR ANGIO INTRA EXTRACRAN SEL COM CAROTID INNOMINATE BILAT MOD SED  05/13/2017  . IR ANGIO VERTEBRAL SEL SUBCLAVIAN INNOMINATE UNI L MOD SED  06/16/2017  . IR ANGIO VERTEBRAL SEL SUBCLAVIAN INNOMINATE UNI R MOD SED  05/13/2017  . IR ANGIO VERTEBRAL SEL VERTEBRAL UNI L MOD SED  05/13/2017  . IR RADIOLOGIST EVAL & MGMT  06/08/2017  . RADIOLOGY WITH ANESTHESIA N/A 06/16/2017   Procedure: ANGIOPLASTY WITH STENTING;  Surgeon: Luanne Bras, MD;  Location: [place];  Service: Radiology;  Laterality: N/A;    There were no vitals filed for this visit.  Subjective Assessment - 08/09/17 1537    Subjective   She has been doing her HEP. She has been sick and is recoverying from being sick and does not feel strong today.    Patient is accompained by:  Family member    Pertinent History  Patinet lives alone and uses a RW if she needs it. Her children are staying  with her after her recent fall. She has lived on her own up until recently when she had a fall and hit her head needing stiches. She has 3 sons and they are rotating staying with her for safety concerns with her balance     Patient Stated Goals  Patient wants to be able to walk better.     Currently in Pain?  No/denies    Pain Score  0-No pain    Pain Onset  Today    Multiple Pain Sites  No      Treatment: [employer] x 5 mins  Strength testing including BLE hip flex/abd/add/ext with 4/5 strength except hip add 3+/5: B knee strength 5/5, B ankle PF 3/5 unable to perform greater than 8 heel raises bilaterally Outcome measures performed : TUG, 5 x sit to stand, single leg stand test bilaterally unchanged from 2 seconds Patient is progressing with goal #1, #2 : She is not progressing with goal #3 and goal #4 is ongoing. Patient was unable to continue with treatment due to just getting over a virus and not having enough energy to continue today.                        PT  Education - 08/09/17 1538    Education provided  Yes    Education Details  saftey with balance     Person(s) Educated  Patient    Methods  Explanation;Demonstration;Verbal cues    Comprehension  Verbalized understanding;Returned demonstration       PT Short Term Goals - 08/09/17 1601      PT SHORT TERM GOAL #1   Title  Patient will be independent in home exercise program to improve strength/mobility for better functional independence with ADLs    Time  4    Period  Weeks    Status  On-going      PT SHORT TERM GOAL #2   Title  Patient (84 years old) will complete five times sit to stand test in < 15 seconds indicating an increased LE strength and improved balance.    Baseline  30.2 sec    Time  4    Period  Weeks    Status  On-going    Target Date  07/28/17        PT Long Term Goals - 08/09/17 1540      PT LONG TERM GOAL #1   Title  Patient will reduce timed up and go to <11 seconds  to reduce fall risk and demonstrate improved transfer/gait ability.    Baseline  19.2 sec; 08/09/17 16.86 sec    Time  8    Period  Weeks    Status  Partially Met    Target Date  08/25/17      PT LONG TERM GOAL #2   Title  Patient (> 82 years old) will complete five times sit to stand test in < 15 seconds indicating an increased LE strength and improved balance.    Baseline  30.2 sec; 08/09/17 24.87    Time  8    Period  Weeks    Status  Partially Met    Target Date  08/25/17      PT LONG TERM GOAL #3   Title  Patient will tolerate 5 seconds of single leg stance without loss of balance to improve ability to get in and out of shower safely.    Baseline  2 sec BLE    Time  8    Period  Weeks    Status  On-going    Target Date  08/25/17      PT LONG TERM GOAL #4   Title  Patient will increase BLE gross strength to 4+/5 as to improve functional strength for independent gait, increased standing tolerance and increased ADL ability.    Baseline  4/5 BLE hips except hip add and hip ext, ankle 3/5 BLE PF; except left add 3/5    Time  8    Period  Weeks    Status  On-going    Target Date  08/25/17            Plan - 08/09/17 1539    Clinical Impression Statement  Patient performs outcome measures and goals were reviewed. Patient has improved with 5 x sit to stand indicating that her falls risk is decreased,; her TUG score also decreased  indicating  a decrease in her falls risk. She continues to have decreased static and dynamic stanidng balance deficit and will continue to benefit from skilled PT to improve her safety with mobility..    Rehab Potential  Good    PT Frequency  2x / week    PT Duration  8 weeks  PT Treatment/Interventions  Patient/family education;Therapeutic exercise;Therapeutic activities;Functional mobility training;Balance training;Neuromuscular re-education    PT Next Visit Plan  balance training and hip and ankle strengthening    PT Home Exercise Plan  heel  raises, hip abd, hip ext with YTB    Consulted and Agree with Plan of Care  Patient;Family member/caregiver       Patient will benefit from skilled therapeutic intervention in order to improve the following deficits and impairments:  Abnormal gait, Decreased balance, Decreased endurance, Decreased mobility, Difficulty walking, Decreased strength  Visit Diagnosis: Muscle weakness (generalized)  Difficulty in walking, not elsewhere classified     Problem List Patient Active Problem List   Diagnosis Date Noted  . Cerebral thrombosis with cerebral infarction 06/05/2017  . Syncope 06/04/2017  . Lacunar infarction 02/20/2014  . Small vessel disease, cerebrovascular 02/20/2014  . Abnormality of gait 02/20/2014  . HYPERCHOLESTEROLEMIA  IIA 08/25/2010  . CORONARY ATHEROSCLEROSIS NATIVE CORONARY ARTERY 08/25/2010  . CHEST PAIN, PRECORDIAL 08/25/2010    Alanson Puls, PT DPT 08/09/2017, 5:28 PM  [place] MAIN [facility] SERVICES [address] [place], [place], [address] Phone: [phone]   Fax:  [phone]  Name: Tracey Hartman MRN: [id number] Date of Birth: 02-20-1930

## 2017-08-10 DIAGNOSIS — R739 Hyperglycemia, unspecified: Secondary | ICD-10-CM | POA: Diagnosis not present

## 2017-08-10 DIAGNOSIS — I771 Stricture of artery: Secondary | ICD-10-CM | POA: Diagnosis not present

## 2017-08-10 DIAGNOSIS — G459 Transient cerebral ischemic attack, unspecified: Secondary | ICD-10-CM | POA: Diagnosis not present

## 2017-08-10 DIAGNOSIS — I639 Cerebral infarction, unspecified: Secondary | ICD-10-CM | POA: Diagnosis not present

## 2017-08-11 ENCOUNTER — Ambulatory Visit: Payer: Medicare Other | Admitting: Physical Therapy

## 2017-08-11 ENCOUNTER — Encounter: Payer: Self-pay | Admitting: Physical Therapy

## 2017-08-11 DIAGNOSIS — M6281 Muscle weakness (generalized): Secondary | ICD-10-CM

## 2017-08-11 DIAGNOSIS — R262 Difficulty in walking, not elsewhere classified: Secondary | ICD-10-CM

## 2017-08-11 DIAGNOSIS — R269 Unspecified abnormalities of gait and mobility: Secondary | ICD-10-CM | POA: Diagnosis not present

## 2017-08-11 DIAGNOSIS — R41841 Cognitive communication deficit: Secondary | ICD-10-CM | POA: Diagnosis not present

## 2017-08-11 NOTE — Therapy (Signed)
[place] MAIN [facility] SERVICES [address] [place], [place], [address] Phone: [phone]   Fax:  [phone]  Physical Therapy Treatment  Patient Details  Name: Tracey Hartman MRN: [id number] Date of Birth: 22-Oct-1929 Referring Provider: Rosalin Hawking,   Encounter Date: 08/11/2017  PT End of Session - 08/11/17 1548    Visit Number  8    Number of Visits  17    Date for PT Re-Evaluation  08/25/17    PT Start Time  0330    PT Stop Time  1572    PT Time Calculation (min)  45 min    Equipment Utilized During Treatment  Gait belt    Activity Tolerance  Patient tolerated treatment well;No increased pain;Patient limited by fatigue    Behavior During Therapy  [facility] for tasks assessed/performed       Past Medical History:  Diagnosis Date  . Coronary artery disease   . Hypertension   . Myocardial infarction ([place]) 1986  . Osteoporosis   . Recurrent UTI   . Stroke [facility])     Past Surgical History:  Procedure Laterality Date  . APPENDECTOMY    . CATARACT EXTRACTION Bilateral   . COLONOSCOPY    . IR ANGIO INTRA EXTRACRAN SEL COM CAROTID INNOMINATE BILAT MOD SED  05/13/2017  . IR ANGIO VERTEBRAL SEL SUBCLAVIAN INNOMINATE UNI L MOD SED  06/16/2017  . IR ANGIO VERTEBRAL SEL SUBCLAVIAN INNOMINATE UNI R MOD SED  05/13/2017  . IR ANGIO VERTEBRAL SEL VERTEBRAL UNI L MOD SED  05/13/2017  . IR RADIOLOGIST EVAL & MGMT  06/08/2017  . RADIOLOGY WITH ANESTHESIA N/A 06/16/2017   Procedure: ANGIOPLASTY WITH STENTING;  Surgeon: Luanne Bras, MD;  Location: [place];  Service: Radiology;  Laterality: N/A;    There were no vitals filed for this visit.  Subjective Assessment - 08/11/17 1546    Subjective   She has been doing her HEP. She was sick and now is beginning to feel better.     Patient is accompained by:  Family member    Pertinent History  Patinet lives alone and uses a RW if she needs it. Her children are staying with her after her recent fall. She  has lived on her own up until recently when she had a fall and hit her head needing stiches. She has 3 sons and they are rotating staying with her for safety concerns with her balance     Patient Stated Goals  Patient wants to be able to walk better.     Currently in Pain?  No/denies    Pain Score  0-No pain    Pain Onset  Today        Treatment: Balance beam side stepping x 5 and min assist and max VC for staying on task  Balance beam fwd stepping x 5 and min assist and max VC for staying on task  Standing on foam with step ups to 6 inch stool and vc for technique and posture and reminders not to use UE for support x 20  Heel raises with cues for posture x 20  Ascending/descending with railings , both descending and 1 rail ascending  4 square side stepping and fwd/bwd stepping x 10   CGA with all above exercises and constant VC to redirect and instruct. Patient has right toe pain during standing exercises and son was informed to look at her foot.  PT Education - 08/11/17 1547    Education provided  Yes    Education Details  saftey with balance and mobility    Person(s) Educated  Patient    Methods  Explanation;Tactile cues;Verbal cues    Comprehension  Verbalized understanding;Returned demonstration       PT Short Term Goals - 08/09/17 1601      PT SHORT TERM GOAL #1   Title  Patient will be independent in home exercise program to improve strength/mobility for better functional independence with ADLs    Time  4    Period  Weeks    Status  On-going      PT SHORT TERM GOAL #2   Title  Patient (< 37 years old) will complete five times sit to stand test in < 15 seconds indicating an increased LE strength and improved balance.    Baseline  30.2 sec    Time  4    Period  Weeks    Status  On-going    Target Date  07/28/17        PT Long Term Goals - 08/09/17 1540      PT LONG TERM GOAL #1   Title  Patient will reduce timed  up and go to <11 seconds to reduce fall risk and demonstrate improved transfer/gait ability.    Baseline  19.2 sec; 08/09/17 16.86 sec    Time  8    Period  Weeks    Status  Partially Met    Target Date  08/25/17      PT LONG TERM GOAL #2   Title  Patient (> 49 years old) will complete five times sit to stand test in < 15 seconds indicating an increased LE strength and improved balance.    Baseline  30.2 sec; 08/09/17 24.87    Time  8    Period  Weeks    Status  Partially Met    Target Date  08/25/17      PT LONG TERM GOAL #3   Title  Patient will tolerate 5 seconds of single leg stance without loss of balance to improve ability to get in and out of shower safely.    Baseline  2 sec BLE    Time  8    Period  Weeks    Status  On-going    Target Date  08/25/17      PT LONG TERM GOAL #4   Title  Patient will increase BLE gross strength to 4+/5 as to improve functional strength for independent gait, increased standing tolerance and increased ADL ability.    Baseline  4/5 BLE hips except hip add and hip ext, ankle 3/5 BLE PF; except left add 3/5    Time  8    Period  Weeks    Status  On-going    Target Date  08/25/17            Plan - 08/11/17 1549    Clinical Impression Statement  Max cueing needed to appropriately perform balance tasks with leg, hip and head position. Decreased coordination demonstrated requiring consistent verbal cueing to correct form. Cognitive understanding of task was delayed. Motor control of LE much improved. Muscle fatigue but no major pain complaintsPatient will continue to benefit from skilled PT to improve balance and mobility.    Rehab Potential  Good    PT Frequency  2x / week    PT Duration  8 weeks    PT Treatment/Interventions  Patient/family  education;Therapeutic exercise;Therapeutic activities;Functional mobility training;Balance training;Neuromuscular re-education    PT Next Visit Plan  balance training and hip and ankle strengthening    PT  Home Exercise Plan  heel raises, hip abd, hip ext with YTB    Consulted and Agree with Plan of Care  Patient;Family member/caregiver       Patient will benefit from skilled therapeutic intervention in order to improve the following deficits and impairments:  Abnormal gait, Decreased balance, Decreased endurance, Decreased mobility, Difficulty walking, Decreased strength  Visit Diagnosis: Muscle weakness (generalized)  Difficulty in walking, not elsewhere classified     Problem List Patient Active Problem List   Diagnosis Date Noted  . Cerebral thrombosis with cerebral infarction 06/05/2017  . Syncope 06/04/2017  . Lacunar infarction 02/20/2014  . Small vessel disease, cerebrovascular 02/20/2014  . Abnormality of gait 02/20/2014  . HYPERCHOLESTEROLEMIA  IIA 08/25/2010  . CORONARY ATHEROSCLEROSIS NATIVE CORONARY ARTERY 08/25/2010  . CHEST PAIN, PRECORDIAL 08/25/2010    Alanson Puls, PT DPT 08/11/2017, 3:50 PM  [place] MAIN [facility] SERVICES [address] [place], [place], [address] Phone: [phone]   Fax:  [phone]  Name: Tracey Hartman MRN: [id number] Date of Birth: 1929/08/24

## 2017-08-16 ENCOUNTER — Ambulatory Visit: Payer: Medicare Other | Admitting: Physical Therapy

## 2017-08-16 ENCOUNTER — Telehealth: Payer: Self-pay

## 2017-08-16 ENCOUNTER — Other Ambulatory Visit: Payer: Self-pay | Admitting: Neurology

## 2017-08-16 DIAGNOSIS — R410 Disorientation, unspecified: Secondary | ICD-10-CM

## 2017-08-16 NOTE — Telephone Encounter (Signed)
Pt's granddaughter requesting speech therapy referral outpatient for cognitive evaluation. Pt's granddaughter has spoken with rehab dept where pt is receiving PT, and that dept can do the speech therapy. [facility] is the facility.

## 2017-08-16 NOTE — Telephone Encounter (Signed)
Ok will do so 

## 2017-08-18 ENCOUNTER — Ambulatory Visit: Payer: Medicare Other | Admitting: Neurology

## 2017-08-19 ENCOUNTER — Ambulatory Visit: Payer: Medicare Other

## 2017-08-19 VITALS — BP 174/109 | HR 69

## 2017-08-19 DIAGNOSIS — R269 Unspecified abnormalities of gait and mobility: Secondary | ICD-10-CM | POA: Diagnosis not present

## 2017-08-19 DIAGNOSIS — R262 Difficulty in walking, not elsewhere classified: Secondary | ICD-10-CM | POA: Diagnosis not present

## 2017-08-19 DIAGNOSIS — M6281 Muscle weakness (generalized): Secondary | ICD-10-CM | POA: Diagnosis not present

## 2017-08-19 DIAGNOSIS — R41841 Cognitive communication deficit: Secondary | ICD-10-CM | POA: Diagnosis not present

## 2017-08-19 NOTE — Therapy (Signed)
[place] MAIN [facility] SERVICES [address] [place], [place], [address] Phone: [phone]   Fax:  [phone]  Physical Therapy Treatment  Patient Details  Name: Tracey Hartman MRN: [id number] Date of Birth: 1930-06-03 Referring Provider: Rosalin Hawking,   Encounter Date: 08/19/2017  PT End of Session - 08/19/17 1708    Visit Number  9    Number of Visits  17    Date for PT Re-Evaluation  08/25/17    PT Start Time  3875    PT Stop Time  6433    PT Time Calculation (min)  35 min    Equipment Utilized During Treatment  Gait belt    Activity Tolerance  Patient tolerated treatment well;No increased pain;Patient limited by fatigue    Behavior During Therapy  [facility] for tasks assessed/performed       Past Medical History:  Diagnosis Date  . Coronary artery disease   . Hypertension   . Myocardial infarction ([place]) 1986  . Osteoporosis   . Recurrent UTI   . Stroke [facility])     Past Surgical History:  Procedure Laterality Date  . APPENDECTOMY    . CATARACT EXTRACTION Bilateral   . COLONOSCOPY    . IR ANGIO INTRA EXTRACRAN SEL COM CAROTID INNOMINATE BILAT MOD SED  05/13/2017  . IR ANGIO VERTEBRAL SEL SUBCLAVIAN INNOMINATE UNI L MOD SED  06/16/2017  . IR ANGIO VERTEBRAL SEL SUBCLAVIAN INNOMINATE UNI R MOD SED  05/13/2017  . IR ANGIO VERTEBRAL SEL VERTEBRAL UNI L MOD SED  05/13/2017  . IR RADIOLOGIST EVAL & MGMT  06/08/2017  . RADIOLOGY WITH ANESTHESIA N/A 06/16/2017   Procedure: ANGIOPLASTY WITH STENTING;  Surgeon: Luanne Bras, MD;  Location: [place];  Service: Radiology;  Laterality: N/A;    Vitals:   08/19/17 1543  BP: (!) 174/109  Pulse: 69  SpO2: 98%    Subjective Assessment - 08/19/17 1543    Subjective  Pt reports that she has been sick recently but she is feeling better today. No pain reported. She is performing her HEP but has been somewhat limited due to sickness. No specific questions or concerns.     Patient is accompained  by:  Family member Son    Pertinent History  Patinet lives alone and uses a RW if she needs it. Her children are staying with her after her recent fall. She has lived on her own up until recently when she had a fall and hit her head needing stiches. She has 3 sons and they are rotating staying with her for safety concerns with her balance     Patient Stated Goals  Patient wants to be able to walk better.     Currently in Pain?  No/denies    Pain Onset  --       Vitals at intake: 174/109, HR: 69, SaO2: 98% Repeat vitals after 3 minutes: 175/80, HR: 64, SaO2: 99% Manual check: 160/90; Aymptomatic   TREATMENT  Neuromuscular Re-education  Balance beam side stepping x 5 and min assist and max VC for staying on task, frequent 2 finger assist on // bars by patient; Balance beam fwd stepping x 5 and min assist and max VC for staying on task, frequent 2 finger assist on // bars by patient; Standing on foam with toe taps to 6 inch stool and vc for technique and posture and reminders not to use UE for support alternating LE x 10 each; Standing on foam with step ups  to 6 inch stool and vc for technique and posture and reminders not to use UE for support alternating LE x 5 each;  Ther-ex  NuStep L1 x 3 minutes for warm-up; Quantum leg press 90# x 15, 105# x 15, 120# x 15, pt provided 90-120s rest breaks between sets;                  PT Education - 08/19/17 1708    Education provided  Yes    Education Details  exercise form/technique    Person(s) Educated  Patient    Methods  Explanation    Comprehension  Verbalized understanding       PT Short Term Goals - 08/09/17 1601      PT SHORT TERM GOAL #1   Title  Patient will be independent in home exercise program to improve strength/mobility for better functional independence with ADLs    Time  4    Period  Weeks    Status  On-going      PT SHORT TERM GOAL #2   Title  Patient (< 97 years old) will complete five times sit to  stand test in < 15 seconds indicating an increased LE strength and improved balance.    Baseline  30.2 sec    Time  4    Period  Weeks    Status  On-going    Target Date  07/28/17        PT Long Term Goals - 08/09/17 1540      PT LONG TERM GOAL #1   Title  Patient will reduce timed up and go to <11 seconds to reduce fall risk and demonstrate improved transfer/gait ability.    Baseline  19.2 sec; 08/09/17 16.86 sec    Time  8    Period  Weeks    Status  Partially Met    Target Date  08/25/17      PT LONG TERM GOAL #2   Title  Patient (> 11 years old) will complete five times sit to stand test in < 15 seconds indicating an increased LE strength and improved balance.    Baseline  30.2 sec; 08/09/17 24.87    Time  8    Period  Weeks    Status  Partially Met    Target Date  08/25/17      PT LONG TERM GOAL #3   Title  Patient will tolerate 5 seconds of single leg stance without loss of balance to improve ability to get in and out of shower safely.    Baseline  2 sec BLE    Time  8    Period  Weeks    Status  On-going    Target Date  08/25/17      PT LONG TERM GOAL #4   Title  Patient will increase BLE gross strength to 4+/5 as to improve functional strength for independent gait, increased standing tolerance and increased ADL ability.    Baseline  4/5 BLE hips except hip add and hip ext, ankle 3/5 BLE PF; except left add 3/5    Time  8    Period  Weeks    Status  On-going    Target Date  08/25/17            Plan - 08/19/17 1709    Clinical Impression Statement  Pt arrived late for her appointment so session had to be shortened accordingly. Pt requires frequent redirection due to distraction during session. She  appears to have some difficulty with cognition, especially short term recall during session. Pt is able to complete all exercises as instructed but she does demonstrate difficulty with balance tasks not reaching for support due to high fear of falling. Pt encouraged to  follow-up as scheduled.     Rehab Potential  Good    PT Frequency  2x / week    PT Duration  8 weeks    PT Treatment/Interventions  Patient/family education;Therapeutic exercise;Therapeutic activities;Functional mobility training;Balance training;Neuromuscular re-education    PT Next Visit Plan  balance training and hip and ankle strengthening    PT Home Exercise Plan  heel raises, hip abd, hip ext with YTB    Consulted and Agree with Plan of Care  Patient;Family member/caregiver       Patient will benefit from skilled therapeutic intervention in order to improve the following deficits and impairments:  Abnormal gait, Decreased balance, Decreased endurance, Decreased mobility, Difficulty walking, Decreased strength  Visit Diagnosis: Muscle weakness (generalized)  Difficulty in walking, not elsewhere classified     Problem List Patient Active Problem List   Diagnosis Date Noted  . Cerebral thrombosis with cerebral infarction 06/05/2017  . Syncope 06/04/2017  . Lacunar infarction 02/20/2014  . Small vessel disease, cerebrovascular 02/20/2014  . Abnormality of gait 02/20/2014  . HYPERCHOLESTEROLEMIA  IIA 08/25/2010  . CORONARY ATHEROSCLEROSIS NATIVE CORONARY ARTERY 08/25/2010  . CHEST PAIN, PRECORDIAL 08/25/2010   Phillips Grout PT, DPT   Hannie Shoe 08/19/2017, 5:11 PM  [place] MAIN [facility] SERVICES [address] [place], [place], [address] Phone: [phone]   Fax:  [phone]  Name: LIZETT CHOWNING MRN: [id number] Date of Birth: May 10, 1930

## 2017-08-23 ENCOUNTER — Encounter: Payer: Self-pay | Admitting: Physical Therapy

## 2017-08-23 ENCOUNTER — Ambulatory Visit: Payer: Medicare Other | Admitting: Physical Therapy

## 2017-08-23 DIAGNOSIS — R269 Unspecified abnormalities of gait and mobility: Secondary | ICD-10-CM | POA: Diagnosis not present

## 2017-08-23 DIAGNOSIS — M6281 Muscle weakness (generalized): Secondary | ICD-10-CM | POA: Diagnosis not present

## 2017-08-23 DIAGNOSIS — R262 Difficulty in walking, not elsewhere classified: Secondary | ICD-10-CM | POA: Diagnosis not present

## 2017-08-23 DIAGNOSIS — R41841 Cognitive communication deficit: Secondary | ICD-10-CM | POA: Diagnosis not present

## 2017-08-23 NOTE — Therapy (Signed)
[place] MAIN [facility] SERVICES [address] [place], [place], [address] Phone: [phone]   Fax:  [phone]  Physical Therapy Treatment  Patient Details  Name: Tracey Hartman MRN: [id number] Date of Birth: 11-Nov-1929 Referring Provider: Rosalin Hawking,   Encounter Date: 08/23/2017  PT End of Session - 08/23/17 1541    Visit Number  10    Number of Visits  33    Date for PT Re-Evaluation  10/18/17    PT Start Time  0330    PT Stop Time  6962    PT Time Calculation (min)  45 min       Past Medical History:  Diagnosis Date  . Coronary artery disease   . Hypertension   . Myocardial infarction ([place]) 1986  . Osteoporosis   . Recurrent UTI   . Stroke [facility])     Past Surgical History:  Procedure Laterality Date  . APPENDECTOMY    . CATARACT EXTRACTION Bilateral   . COLONOSCOPY    . IR ANGIO INTRA EXTRACRAN SEL COM CAROTID INNOMINATE BILAT MOD SED  05/13/2017  . IR ANGIO VERTEBRAL SEL SUBCLAVIAN INNOMINATE UNI L MOD SED  06/16/2017  . IR ANGIO VERTEBRAL SEL SUBCLAVIAN INNOMINATE UNI R MOD SED  05/13/2017  . IR ANGIO VERTEBRAL SEL VERTEBRAL UNI L MOD SED  05/13/2017  . IR RADIOLOGIST EVAL & MGMT  06/08/2017  . RADIOLOGY WITH ANESTHESIA N/A 06/16/2017   Procedure: ANGIOPLASTY WITH STENTING;  Surgeon: Luanne Bras, MD;  Location: [place];  Service: Radiology;  Laterality: N/A;    There were no vitals filed for this visit.  Subjective Assessment - 08/23/17 1540    Subjective  Pt reports that she has been sick recently and is very tired and weak. No pain reported. She is performing her HEP but has been somewhat limited due to sickness. No specific questions or concerns.     Patient is accompained by:  Family member Son    Pertinent History  Patinet lives alone and uses a RW if she needs it. Her children are staying with her after her recent fall. She has lived on her own up until recently when she had a fall and hit her head needing  stiches. She has 3 sons and they are rotating staying with her for safety concerns with her balance     Patient Stated Goals  Patient wants to be able to walk better.     Currently in Pain?  No/denies    Pain Score  0-No pain    Pain Onset  Today       NEUROMUSCULAR RE-EDUCATION  Airex NBOS eyes open x 30 seconds each; Airex NBOS eyes open horizontal and vertical head turns x 30 seconds; Airex  cone reaching crossing midline x 20 with cones Toe tapping 6 inch stool without UE assist Tandem gait in // bars x 4 laps  Side stepping on blue foam balance beam x 5 lengths of the parallel bars    Therapeutic exercise Quantum double leg press 75 ls x 20 x 2, heel raises x 20 x 2 Matrix resisted gait forward, retro x 5 each, and lateral x 3 each direction all 12.5# tried 17.5# but too much; Sit to stand without UE support x 10 x2                       PT Education - 08/23/17 1541    Education provided  Yes  Education Details  reviewed HEP    Person(s) Educated  Patient    Methods  Explanation    Comprehension  Verbalized understanding       PT Short Term Goals - 08/09/17 1601      PT SHORT TERM GOAL #1   Title  Patient will be independent in home exercise program to improve strength/mobility for better functional independence with ADLs    Time  4    Period  Weeks    Status  On-going      PT SHORT TERM GOAL #2   Title  Patient (< 57 years old) will complete five times sit to stand test in < 15 seconds indicating an increased LE strength and improved balance.    Baseline  30.2 sec    Time  4    Period  Weeks    Status  On-going    Target Date  07/28/17        PT Long Term Goals - 08/23/17 1548      PT LONG TERM GOAL #1   Title  Patient will reduce timed up and go to <11 seconds to reduce fall risk and demonstrate improved transfer/gait ability.    Baseline  19.2 sec; 08/09/17 16.86 sec    Time  8    Period  Weeks    Status  Partially Met    Target  Date  10/18/17      PT LONG TERM GOAL #2   Title  Patient (> 51 years old) will complete five times sit to stand test in < 15 seconds indicating an increased LE strength and improved balance.    Baseline  30.2 sec; 08/09/17 24.87    Time  8    Period  Weeks    Status  Partially Met    Target Date  10/18/17      PT LONG TERM GOAL #3   Title  Patient will tolerate 5 seconds of single leg stance without loss of balance to improve ability to get in and out of shower safely.    Baseline  2 sec BLE    Time  8    Period  Weeks    Status  On-going    Target Date  10/18/17      PT LONG TERM GOAL #4   Title  Patient will increase BLE gross strength to 4+/5 as to improve functional strength for independent gait, increased standing tolerance and increased ADL ability.    Baseline  4/5 BLE hips except hip add and hip ext, ankle 3/5 BLE PF; except left add 3/5    Time  8    Period  Weeks    Status  On-going    Target Date  10/18/17            Plan - 08/23/17 1542    Clinical Impression Statement  Patient has had 2 episodes of the flu and has gotten weak from this illness. She will benefit from continued skilled PT to improve her strength and balance. Patient required min verbal cues to perform matrix fwd/ bwd/ side stepping with weights for posture and control and required verbal and tactile cues during all dynamic standing balance activities. Patient demonstrated decreased gait speed with ambulation without AD.  Patient will continue to benefit from skilled therapy in order to improve strength, dynamic standing balance and increase gait speed to reduce risk for falls    Rehab Potential  Good    PT Frequency  2x /  week    PT Duration  8 weeks    PT Treatment/Interventions  Patient/family education;Therapeutic exercise;Therapeutic activities;Functional mobility training;Balance training;Neuromuscular re-education    PT Next Visit Plan  balance training and hip and ankle strengthening    PT  Home Exercise Plan  heel raises, hip abd, hip ext with YTB    Consulted and Agree with Plan of Care  Patient;Family member/caregiver       Patient will benefit from skilled therapeutic intervention in order to improve the following deficits and impairments:  Abnormal gait, Decreased balance, Decreased endurance, Decreased mobility, Difficulty walking, Decreased strength  Visit Diagnosis: Abnormality of gait     Problem List Patient Active Problem List   Diagnosis Date Noted  . Cerebral thrombosis with cerebral infarction 06/05/2017  . Syncope 06/04/2017  . Lacunar infarction 02/20/2014  . Small vessel disease, cerebrovascular 02/20/2014  . Abnormality of gait 02/20/2014  . HYPERCHOLESTEROLEMIA  IIA 08/25/2010  . CORONARY ATHEROSCLEROSIS NATIVE CORONARY ARTERY 08/25/2010  . CHEST PAIN, PRECORDIAL 08/25/2010    Alanson Puls, PT DPT 08/23/2017, 3:50 PM  [place] MAIN [facility] SERVICES [address] [place], [place], [address] Phone: [phone]   Fax:  [phone]  Name: Tracey Hartman MRN: [id number] Date of Birth: Jul 29, 1930

## 2017-08-25 ENCOUNTER — Encounter: Payer: Self-pay | Admitting: Physical Therapy

## 2017-08-25 ENCOUNTER — Ambulatory Visit: Payer: Medicare Other | Admitting: Physical Therapy

## 2017-08-25 DIAGNOSIS — R269 Unspecified abnormalities of gait and mobility: Secondary | ICD-10-CM | POA: Diagnosis not present

## 2017-08-25 DIAGNOSIS — R262 Difficulty in walking, not elsewhere classified: Secondary | ICD-10-CM

## 2017-08-25 DIAGNOSIS — M6281 Muscle weakness (generalized): Secondary | ICD-10-CM | POA: Diagnosis not present

## 2017-08-25 DIAGNOSIS — R41841 Cognitive communication deficit: Secondary | ICD-10-CM | POA: Diagnosis not present

## 2017-08-25 NOTE — Therapy (Signed)
[place] MAIN [facility] SERVICES [address] [place], [place], [address] Phone: [phone]   Fax:  [phone]  Physical Therapy Treatment  Patient Details  Name: Tracey Hartman MRN: [id number] Date of Birth: 1930-07-04 Referring Provider: Rosalin Hawking,   Encounter Date: 08/25/2017  PT End of Session - 08/25/17 1524    Visit Number  11    Number of Visits  33    Date for PT Re-Evaluation  10/18/17    PT Start Time  0315    PT Stop Time  0400    PT Time Calculation (min)  45 min    Equipment Utilized During Treatment  Gait belt    Activity Tolerance  Patient tolerated treatment well;No increased pain;Patient limited by fatigue    Behavior During Therapy  [facility] for tasks assessed/performed       Past Medical History:  Diagnosis Date  . Coronary artery disease   . Hypertension   . Myocardial infarction ([place]) 1986  . Osteoporosis   . Recurrent UTI   . Stroke [facility])     Past Surgical History:  Procedure Laterality Date  . APPENDECTOMY    . CATARACT EXTRACTION Bilateral   . COLONOSCOPY    . IR ANGIO INTRA EXTRACRAN SEL COM CAROTID INNOMINATE BILAT MOD SED  05/13/2017  . IR ANGIO VERTEBRAL SEL SUBCLAVIAN INNOMINATE UNI L MOD SED  06/16/2017  . IR ANGIO VERTEBRAL SEL SUBCLAVIAN INNOMINATE UNI R MOD SED  05/13/2017  . IR ANGIO VERTEBRAL SEL VERTEBRAL UNI L MOD SED  05/13/2017  . IR RADIOLOGIST EVAL & MGMT  06/08/2017  . RADIOLOGY WITH ANESTHESIA N/A 06/16/2017   Procedure: ANGIOPLASTY WITH STENTING;  Surgeon: Luanne Bras, MD;  Location: [place];  Service: Radiology;  Laterality: N/A;    There were no vitals filed for this visit.  Subjective Assessment - 08/25/17 1523    Subjective  Pt reports that she has been sick recently and is very tired and weak. No pain reported. She is still getting better from being sick.      Patient is accompained by:  Family member Son    Pertinent History  Patinet lives alone and uses a RW if she needs  it. Her children are staying with her after her recent fall. She has lived on her own up until recently when she had a fall and hit her head needing stiches. She has 3 sons and they are rotating staying with her for safety concerns with her balance     Patient Stated Goals  Patient wants to be able to walk better.     Currently in Pain?  Yes    Pain Score  4     Pain Location  Foot    Pain Orientation  Right    Pain Descriptors / Indicators  Tender    Pain Type  Acute pain    Pain Onset  Today    Pain Frequency  Occasional    Aggravating Factors   touching it , it is sensitive    Pain Relieving Factors  staying off it    Effect of Pain on Daily Activities  unable to stand or walk      Treatment:  TM walking with elevation 1 and 1.0 miles /  Hour x 5 mins ( right big toe pain)  Leg press 90 lbs x 20 x 2  Supine bridging x 10 x 2  Supine hip abd x 20 x 2 BLE  Supine SLR  x 10 x 2 BLE with opposite leg flexed   sidelying hip abd x 10 x 2  sidelying clams x 10 x 2  sidlying hip flex / ext x 10 x 2  Patient needs constant cueing for correct technique.                      PT Education - 08/25/17 1524    Education provided  Yes    Education Details  HEP    Person(s) Educated  Patient    Methods  Explanation    Comprehension  Verbalized understanding       PT Short Term Goals - 08/09/17 1601      PT SHORT TERM GOAL #1   Title  Patient will be independent in home exercise program to improve strength/mobility for better functional independence with ADLs    Time  4    Period  Weeks    Status  On-going      PT SHORT TERM GOAL #2   Title  Patient (< 54 years old) will complete five times sit to stand test in < 15 seconds indicating an increased LE strength and improved balance.    Baseline  30.2 sec    Time  4    Period  Weeks    Status  On-going    Target Date  07/28/17        PT Long Term Goals - 08/23/17 1548      PT LONG TERM GOAL #1    Title  Patient will reduce timed up and go to <11 seconds to reduce fall risk and demonstrate improved transfer/gait ability.    Baseline  19.2 sec; 08/09/17 16.86 sec    Time  8    Period  Weeks    Status  Partially Met    Target Date  10/18/17      PT LONG TERM GOAL #2   Title  Patient (> 51 years old) will complete five times sit to stand test in < 15 seconds indicating an increased LE strength and improved balance.    Baseline  30.2 sec; 08/09/17 24.87    Time  8    Period  Weeks    Status  Partially Met    Target Date  10/18/17      PT LONG TERM GOAL #3   Title  Patient will tolerate 5 seconds of single leg stance without loss of balance to improve ability to get in and out of shower safely.    Baseline  2 sec BLE    Time  8    Period  Weeks    Status  On-going    Target Date  10/18/17      PT LONG TERM GOAL #4   Title  Patient will increase BLE gross strength to 4+/5 as to improve functional strength for independent gait, increased standing tolerance and increased ADL ability.    Baseline  4/5 BLE hips except hip add and hip ext, ankle 3/5 BLE PF; except left add 3/5    Time  8    Period  Weeks    Status  On-going    Target Date  10/18/17            Plan - 08/25/17 1526    Clinical Impression Statement  Continuous verbal cues and tactile cues needed to correct form with exercises and for posture correction. Patient required min verbal cues to perform matrix fwd/ bwd/ side stepping with  weights for posture and control and required verbal and tactile cues during all dynamic standing balance activities.  Patient is able to perform strengthening exercises without reports of increased pain. Patient does need supervision during therapeutic exercises for correct from and technique. Patient will benefit from further skilled therapy to return to prior level of function.    Rehab Potential  Good    PT Frequency  2x / week    PT Duration  8 weeks    PT Treatment/Interventions   Patient/family education;Therapeutic exercise;Therapeutic activities;Functional mobility training;Balance training;Neuromuscular re-education    PT Next Visit Plan  balance training and hip and ankle strengthening    PT Home Exercise Plan  heel raises, hip abd, hip ext with YTB    Consulted and Agree with Plan of Care  Patient;Family member/caregiver       Patient will benefit from skilled therapeutic intervention in order to improve the following deficits and impairments:  Abnormal gait, Decreased balance, Decreased endurance, Decreased mobility, Difficulty walking, Decreased strength  Visit Diagnosis: Abnormality of gait  Muscle weakness (generalized)  Difficulty in walking, not elsewhere classified     Problem List Patient Active Problem List   Diagnosis Date Noted  . Cerebral thrombosis with cerebral infarction 06/05/2017  . Syncope 06/04/2017  . Lacunar infarction 02/20/2014  . Small vessel disease, cerebrovascular 02/20/2014  . Abnormality of gait 02/20/2014  . HYPERCHOLESTEROLEMIA  IIA 08/25/2010  . CORONARY ATHEROSCLEROSIS NATIVE CORONARY ARTERY 08/25/2010  . CHEST PAIN, PRECORDIAL 08/25/2010    Monserrath Junio, [place] S . PT DPT 08/25/2017, 4:41 PM  [place] MAIN [facility] SERVICES [address] [place], [place], [address] Phone: [phone]   Fax:  [phone]  Name: BRENIYAH ROMM MRN: [id number] Date of Birth: 1930-04-17

## 2017-08-30 ENCOUNTER — Ambulatory Visit: Payer: Medicare Other | Admitting: Physical Therapy

## 2017-08-31 ENCOUNTER — Ambulatory Visit: Payer: Medicare Other | Admitting: Speech Pathology

## 2017-08-31 ENCOUNTER — Ambulatory Visit: Payer: Medicare Other | Admitting: Physical Therapy

## 2017-08-31 ENCOUNTER — Encounter: Payer: Self-pay | Admitting: Physical Therapy

## 2017-08-31 DIAGNOSIS — R269 Unspecified abnormalities of gait and mobility: Secondary | ICD-10-CM

## 2017-08-31 DIAGNOSIS — R262 Difficulty in walking, not elsewhere classified: Secondary | ICD-10-CM

## 2017-08-31 DIAGNOSIS — R41841 Cognitive communication deficit: Secondary | ICD-10-CM | POA: Diagnosis not present

## 2017-08-31 DIAGNOSIS — M6281 Muscle weakness (generalized): Secondary | ICD-10-CM

## 2017-08-31 NOTE — Therapy (Signed)
[place] MAIN [facility] SERVICES [address] [place], [place], [address] Phone: [phone]   Fax:  [phone]  Physical Therapy Treatment  Patient Details  Name: Tracey Hartman MRN: [id number] Date of Birth: [birth year] Referring Provider: Rosalin Hawking,   Encounter Date: 08/31/2017  PT End of Session - 08/31/17 1333    Visit Number  12    Number of Visits  33    Date for PT Re-Evaluation  10/18/17    PT Start Time  0105    PT Stop Time  0145    PT Time Calculation (min)  40 min    Equipment Utilized During Treatment  Gait belt    Activity Tolerance  Patient tolerated treatment well;No increased pain;Patient limited by fatigue    Behavior During Therapy  [facility] for tasks assessed/performed       Past Medical History:  Diagnosis Date  . Coronary artery disease   . Hypertension   . Myocardial infarction ([place]) 1986  . Osteoporosis   . Recurrent UTI   . Stroke [facility])     Past Surgical History:  Procedure Laterality Date  . APPENDECTOMY    . CATARACT EXTRACTION Bilateral   . COLONOSCOPY    . IR ANGIO INTRA EXTRACRAN SEL COM CAROTID INNOMINATE BILAT MOD SED  05/13/2017  . IR ANGIO VERTEBRAL SEL SUBCLAVIAN INNOMINATE UNI L MOD SED  06/16/2017  . IR ANGIO VERTEBRAL SEL SUBCLAVIAN INNOMINATE UNI R MOD SED  05/13/2017  . IR ANGIO VERTEBRAL SEL VERTEBRAL UNI L MOD SED  05/13/2017  . IR RADIOLOGIST EVAL & MGMT  06/08/2017  . RADIOLOGY WITH ANESTHESIA N/A 06/16/2017   Procedure: ANGIOPLASTY WITH STENTING;  Surgeon: Luanne Bras, MD;  Location: [place];  Service: Radiology;  Laterality: N/A;    There were no vitals filed for this visit.  Subjective Assessment - 08/31/17 1332    Subjective  Pt reports that she is beginning to feel a bit better.  No pain reported. She is still getting better from being sick.      Patient is accompained by:  Family member Son    Pertinent History  Patinet lives alone and uses a RW if she needs it. Her children  are staying with her after her recent fall. She has lived on her own up until recently when she had a fall and hit her head needing stiches. She has 3 sons and they are rotating staying with her for safety concerns with her balance     Patient Stated Goals  Patient wants to be able to walk better.     Currently in Pain?  No/denies    Pain Score  0-No pain    Pain Onset  Today       NMR: Standing on floor , No UE support and tapping target fwd , tapping target side stepping x 10 BLE  Standing on floor tandem with RLE in front, tandem with LLE in front, tandem x1 minute each, cues for technique and posture correction  Standing on floor with feet together s side to side , cues for technique and posture correction  Standing on 4 square  cues for technique and posture correction  Rocker board side to side taps x 20 , cues to straighten LE's   Patient required tactile cueing and CGA during all dynamic standing balance activities. Patient required verbal and tactile cueing to maintain correct position during sidelying clamshell exercise.  Therapeutic exercise:   leg press 60  lbs  2 x 20 each. Patient's L LE fatigues much fast than R LE. One rest break necessary on L side to complete last set.  Lunges onto bosu ball (rounded side up) 15x each. Patient instructed to put full body weight through lead leg and to explode off it back to starting position.  CGA and Min to mod verbal cues used throughout with increased in postural sway and LOB most seen with narrow base of support and while on uneven surfaces. Continues to have balance deficits typical with diagnosis. Patient performs intermediate level exercises without pain behaviors and needs verbal cuing for postural alignment and head positioning                    PT Education - 08/31/17 1332    Education provided  Yes    Education Details  HEP and Psychiatric nurse) Educated  Patient    Methods   Explanation;Demonstration;Tactile cues    Comprehension  Verbalized understanding;Returned demonstration;Verbal cues required       PT Short Term Goals - 08/09/17 1601      PT SHORT TERM GOAL #1   Title  Patient will be independent in home exercise program to improve strength/mobility for better functional independence with ADLs    Time  4    Period  Weeks    Status  On-going      PT SHORT TERM GOAL #2   Title  Patient (< 64 years old) will complete five times sit to stand test in < 15 seconds indicating an increased LE strength and improved balance.    Baseline  30.2 sec    Time  4    Period  Weeks    Status  On-going    Target Date  07/28/17        PT Long Term Goals - 08/23/17 1548      PT LONG TERM GOAL #1   Title  Patient will reduce timed up and go to <11 seconds to reduce fall risk and demonstrate improved transfer/gait ability.    Baseline  19.2 sec; 08/09/17 16.86 sec    Time  8    Period  Weeks    Status  Partially Met    Target Date  10/18/17      PT LONG TERM GOAL #2   Title  Patient (> 37 years old) will complete five times sit to stand test in < 15 seconds indicating an increased LE strength and improved balance.    Baseline  30.2 sec; 08/09/17 24.87    Time  8    Period  Weeks    Status  Partially Met    Target Date  10/18/17      PT LONG TERM GOAL #3   Title  Patient will tolerate 5 seconds of single leg stance without loss of balance to improve ability to get in and out of shower safely.    Baseline  2 sec BLE    Time  8    Period  Weeks    Status  On-going    Target Date  10/18/17      PT LONG TERM GOAL #4   Title  Patient will increase BLE gross strength to 4+/5 as to improve functional strength for independent gait, increased standing tolerance and increased ADL ability.    Baseline  4/5 BLE hips except hip add and hip ext, ankle 3/5 BLE PF; except left add 3/5    Time  8  Period  Weeks    Status  On-going    Target Date  10/18/17             Plan - 08/31/17 1333    Clinical Impression Statement  Mod cueing needed to appropriately perform balance tasks with leg, hip and head position.Patient has difficulty with single leg standing and narrow base of support activities.  Decreased coordination demonstrated requiring consistent verbal cueing to correct form. Cognitive understanding of task was delayed. Motor control of LE much improved. Muscle fatigue but no major pain complaints. Patient will continue to benefit from skilled PT to improve balance and mobility.    Rehab Potential  Good    PT Frequency  2x / week    PT Duration  8 weeks    PT Treatment/Interventions  Patient/family education;Therapeutic exercise;Therapeutic activities;Functional mobility training;Balance training;Neuromuscular re-education    PT Next Visit Plan  balance training and hip and ankle strengthening    PT Home Exercise Plan  heel raises, hip abd, hip ext with YTB    Consulted and Agree with Plan of Care  Patient;Family member/caregiver       Patient will benefit from skilled therapeutic intervention in order to improve the following deficits and impairments:  Abnormal gait, Decreased balance, Decreased endurance, Decreased mobility, Difficulty walking, Decreased strength  Visit Diagnosis: Abnormality of gait  Muscle weakness (generalized)  Difficulty in walking, not elsewhere classified     Problem List Patient Active Problem List   Diagnosis Date Noted  . Cerebral thrombosis with cerebral infarction 06/05/2017  . Syncope 06/04/2017  . Lacunar infarction 02/20/2014  . Small vessel disease, cerebrovascular 02/20/2014  . Abnormality of gait 02/20/2014  . HYPERCHOLESTEROLEMIA  IIA 08/25/2010  . CORONARY ATHEROSCLEROSIS NATIVE CORONARY ARTERY 08/25/2010  . CHEST PAIN, PRECORDIAL 08/25/2010    Alanson Puls, PT DPT 08/31/2017, 1:36 PM  [place] MAIN [facility] SERVICES [address]  [place], [place], [address] Phone: [phone]   Fax:  [phone]  Name: PAMULA LUTHER MRN: [id number] Date of Birth: Jul 16, 1930

## 2017-08-31 NOTE — Therapy (Signed)
[place] MAIN [facility] SERVICES [place], [place], [address] Phone: [phone]   Fax:  [phone]  Speech Language Pathology Evaluation  Patient Details  Name: Tracey Hartman MRN: [id number] Date of Birth: 06/28/1930 Referring Provider: Dr. Antony Contras   Encounter Date: 08/31/2017  End of Session - 08/31/17 1638    Visit Number  1    Number of Visits  17    Date for SLP Re-Evaluation  10/29/17    Activity Tolerance  Patient tolerated treatment well       Past Medical History:  Diagnosis Date  . Coronary artery disease   . Hypertension   . Myocardial infarction ([place]) 1986  . Osteoporosis   . Recurrent UTI   . Stroke [facility])     Past Surgical History:  Procedure Laterality Date  . APPENDECTOMY    . CATARACT EXTRACTION Bilateral   . COLONOSCOPY    . IR ANGIO INTRA EXTRACRAN SEL COM CAROTID INNOMINATE BILAT MOD SED  05/13/2017  . IR ANGIO VERTEBRAL SEL SUBCLAVIAN INNOMINATE UNI L MOD SED  06/16/2017  . IR ANGIO VERTEBRAL SEL SUBCLAVIAN INNOMINATE UNI R MOD SED  05/13/2017  . IR ANGIO VERTEBRAL SEL VERTEBRAL UNI L MOD SED  05/13/2017  . IR RADIOLOGIST EVAL & MGMT  06/08/2017  . RADIOLOGY WITH ANESTHESIA N/A 06/16/2017   Procedure: ANGIOPLASTY WITH STENTING;  Surgeon: Luanne Bras, MD;  Location: [place];  Service: Radiology;  Laterality: N/A;    There were no vitals filed for this visit.      SLP Evaluation [facility] - 08/31/17 1602      SLP Visit Information   SLP Received On  08/31/17    Referring Provider  Dr. Antony Contras    Onset Date  08/16/17    Medical Diagnosis  CVA/confusion      Subjective   Subjective  Pt seen for outpatient SLP evaluation. Son present for initial interview and final discussion of results/recommendations    Patient/Family Stated Goal  "to get back to functioning as I was before all this happened"      Pain Assessment   Currently in Pain?  No/denies      General Information   HPI   84 year old female referred for outpatient SLP evaluation due to multiple CVAs over the past year, with decline in cognition and increase in confusion.    Behavioral/Cognition  Moderate Cognitive Impairment   Mobility Status  ambulates without device      Prior Functional Status   Cognitive/Linguistic Baseline  Within functional limits    Type of Home  House     Lives With  Alone    Available Support  Family;Other (Comment) family with pt during waking hours. 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      Cognition   Overall Cognitive Status  Impaired/Different from baseline    Area of Impairment  Orientation;Attention;Memory;Safety/judgement;Awareness;Problem solving    Orientation Level  Disoriented to;Time    Current Attention Level  Focused;Sustained;Selective    Attention Comments  Impaired sustained, selective, alternating and divided attention    Memory  Decreased short-term memory    Safety/Judgement  Decreased awareness of safety    Awareness  Emergent    Problem Solving  Slow processing;Difficulty sequencing    Attention  Focused;Sustained;Selective;Alternating;Divided    Focused Attention  Appears intact    Sustained Attention  Impaired    Sustained Attention Impairment  Verbal basic;Functional basic    Selective Attention  Impaired    Selective Attention Impairment  Verbal basic;Functional basic    Alternating Attention  Impaired    Alternating Attention Impairment  Verbal basic;Functional basic    Divided Attention  Impaired    Divided Attention Impairment  Verbal basic;Functional basic    Memory  Impaired    Memory Impairment  Storage deficit;Retrieval deficit;Decreased recall of new information;Decreased short term memory;Prospective memory    Decreased Short Term Memory  Verbal basic;Functional basic    Awareness  Impaired    Awareness Impairment  Emergent impairment    Problem Solving  Impaired     Problem Solving Impairment  Verbal basic;Functional basic    Oceanographer;Sequencing;Organizing;Decision Making;Self Monitoring;Self Correcting    Reasoning  Impaired    Reasoning Impairment  Verbal basic;Functional basic    Sequencing  Impaired    Sequencing Impairment  Verbal basic;Functional basic    Organizing  Impaired    Organizing Impairment  Verbal basic;Functional basic    Decision Making  Impaired    Decision Making Impairment  Verbal basic;Functional basic    Self Monitoring  Impaired    Self Monitoring Impairment  Verbal basic;Functional basic    Self Correcting  Impaired    Self Correcting Impairment  Verbal basic;Functional basic      Auditory Comprehension   Overall Auditory Comprehension  Appears within functional limits for tasks assessed      Expression   Primary Mode of Expression  Verbal      Written Expression   Dominant Hand  Right      Oral Motor/Sensory Function   Overall Oral Motor/Sensory Function  Appears within functional limits for tasks assessed      Motor Speech   Overall Motor Speech  Appears within functional limits for tasks assessed      Standardized Assessments   Standardized Assessments   Montreal Cognitive Assessment [facility])    Montreal Cognitive Assessment ([place])   16/30                      SLP Education - 08/31/17 1638    Education provided  Yes    Education Details  Cognitive impairment, functional implications,, need for 24 hour supervision    Person(s) Educated  Patient;Child(ren)    Methods  Explanation;Demonstration;Verbal cues    Comprehension  Verbalized understanding;Need further instruction         SLP Long Term Goals - 08/31/17 1647      SLP LONG TERM GOAL #1   Title  Pt will demonstrate functional cognitive/communication skills for independent completion of personal responsibilities    Time  8    Period  Weeks    Status  New      SLP LONG TERM GOAL #2   Title  Pt will complete  complex cognitive tasks with 80% accuracy (including attention, reasoning, problem solving, thought organization)    Time  8    Period  Weeks    Status  New      SLP LONG TERM GOAL #3   Title  Pt will demosntrate functional use of external memory aids with 80% accuracy within structured setting    Time  8    Period  Weeks    Status  New       Plan - 08/31/17 1640    Clinical Impression Statement  The Montreal Cognitive Assessment was administered. Pt  scored 16/30 (n=26+/30), indicating moderate cognitive impairment. Points were lost on the following subtests: executive function, immediate and delayed recall, thought organization, orientation to time, calculation, abstract reasoning, visuoperception and attention. Discussion with pt and her son Elta Guadeloupe) reveal change in pt cognitive status and safety alone since pt had several CVA's over the past year. Pt stays alone overnight, but has family present during the day. Her son's are now taking care of bills, appointments, med administration, cooking/cleaning, and management of mail. They both report that it takes her longer to do things, and that she is having difficulty with new learning (how to use new appliances at home). Pt has been very independent in the past, and ran the [employer] for 30 years. Skilled ST intervention is recommended to increase pt safety and independence, maximize function, and reduce caregiver burden. Pt and son were encouraged to have someone stay overnight with her, and she was encouraged not to drive. SLP also suggested use of sitcky notes on appliances with basic instructions for safe use.      Speech Therapy Frequency  2x / week    Duration  -- 8 weeks    Treatment/Interventions  Cognitive reorganization;SLP instruction and feedback;Environmental controls;Internal/external aids;Compensatory strategies;Compensatory techniques;Patient/family education;Functional tasks;Multimodal communcation approach     Potential to Achieve Goals  Good    Potential Considerations  Ability to learn/carryover information;Severity of impairments;Cooperation/participation level;Family/community support;Previous level of function    Consulted and Agree with Plan of Care  Family member/caregiver;Patient    Family Member Consulted  son, Elta Guadeloupe       Patient will benefit from skilled therapeutic intervention in order to improve the following deficits and impairments:   Cognitive communication deficit - Plan: SLP plan of care cert/re-cert    Problem List Patient Active Problem List   Diagnosis Date Noted  . Cerebral thrombosis with cerebral infarction 06/05/2017  . Syncope 06/04/2017  . Lacunar infarction 02/20/2014  . Small vessel disease, cerebrovascular 02/20/2014  . Abnormality of gait 02/20/2014  . HYPERCHOLESTEROLEMIA  IIA 08/25/2010  . CORONARY ATHEROSCLEROSIS NATIVE CORONARY ARTERY 08/25/2010  . CHEST PAIN, PRECORDIAL 08/25/2010   Hagen Bohorquez B. Quentin Ore [facility], CCC-SLP Speech Language Pathologist  Shonna Chock 08/31/2017, 4:55 PM  [place] MAIN [facility] SERVICES [address] [place], [place], [address] Phone: [phone]   Fax:  [phone]  Name: BETSIE PECKMAN MRN: [id number] Date of Birth: 10-25-1929

## 2017-09-01 ENCOUNTER — Ambulatory Visit: Payer: Medicare Other | Admitting: Physical Therapy

## 2017-09-02 ENCOUNTER — Encounter: Payer: Self-pay | Admitting: Speech Pathology

## 2017-09-02 ENCOUNTER — Ambulatory Visit: Payer: Medicare Other | Admitting: Speech Pathology

## 2017-09-02 DIAGNOSIS — R41841 Cognitive communication deficit: Secondary | ICD-10-CM | POA: Diagnosis not present

## 2017-09-02 DIAGNOSIS — M6281 Muscle weakness (generalized): Secondary | ICD-10-CM | POA: Diagnosis not present

## 2017-09-02 DIAGNOSIS — R262 Difficulty in walking, not elsewhere classified: Secondary | ICD-10-CM | POA: Diagnosis not present

## 2017-09-02 DIAGNOSIS — R269 Unspecified abnormalities of gait and mobility: Secondary | ICD-10-CM | POA: Diagnosis not present

## 2017-09-02 NOTE — Therapy (Signed)
[place] MAIN [facility] SERVICES [address] [place], [place], [address] Phone: [phone]   Fax:  [phone]  Speech Language Pathology Treatment  Patient Details  Name: Tracey Hartman MRN: [id number] Date of Birth: 04-28-1930 Referring Provider: Dr. Antony Contras   Encounter Date: 09/02/2017  End of Session - 09/02/17 1604    Visit Number  2    Number of Visits  17    Date for SLP Re-Evaluation  10/29/17    SLP Start Time  1400    SLP Stop Time   1455    SLP Time Calculation (min)  55 min    Activity Tolerance  Patient tolerated treatment well       Past Medical History:  Diagnosis Date  . Coronary artery disease   . Hypertension   . Myocardial infarction ([place]) 1986  . Osteoporosis   . Recurrent UTI   . Stroke [facility])     Past Surgical History:  Procedure Laterality Date  . APPENDECTOMY    . CATARACT EXTRACTION Bilateral   . COLONOSCOPY    . IR ANGIO INTRA EXTRACRAN SEL COM CAROTID INNOMINATE BILAT MOD SED  05/13/2017  . IR ANGIO VERTEBRAL SEL SUBCLAVIAN INNOMINATE UNI L MOD SED  06/16/2017  . IR ANGIO VERTEBRAL SEL SUBCLAVIAN INNOMINATE UNI R MOD SED  05/13/2017  . IR ANGIO VERTEBRAL SEL VERTEBRAL UNI L MOD SED  05/13/2017  . IR RADIOLOGIST EVAL & MGMT  06/08/2017  . RADIOLOGY WITH ANESTHESIA N/A 06/16/2017   Procedure: ANGIOPLASTY WITH STENTING;  Surgeon: Luanne Bras, MD;  Location: [place];  Service: Radiology;  Laterality: N/A;    There were no vitals filed for this visit.  Subjective Assessment - 09/02/17 1603    Subjective  Pt seen with son Jenny Reichmann present today.    Patient is accompained by:  Family member    Currently in Pain?  No/denies            ADULT SLP TREATMENT - 09/02/17 0001      General Information   Behavior/Cognition  Alert;Cooperative;Pleasant mood    HPI  84 year old female referred for outpatient skilled ST following multiple TIA's and confusion.      Treatment Provided   Treatment  provided  Cognitive-Linguistic      Pain Assessment   Pain Assessment  No/denies pain      Progression Toward Goals   Progression toward goals  Progressing toward goals       SLP Education - 09/02/17 1603    Education provided  Yes    Education Details  identification of what pt needs assistance with, and what she does not, as her sons are doing most everything for her, and her goal is to return to independence.    Person(s) Educated  Patient;Child(ren)    Methods  Explanation;Demonstration;Verbal cues;Handout    Comprehension  Verbalized understanding;Need further instruction         SLP Long Term Goals - 08/31/17 1647      SLP LONG TERM GOAL #1   Title  Pt will demonstrate functional cognitive/communication skills for independent completion of personal responsibilities    Time  8    Period  Weeks    Status  New      SLP LONG TERM GOAL #2   Title  Pt will complete complex cognitive tasks with 80% accuracy (including attention, reasoning, problem solving, thought organization)    Time  8    Period  Weeks  Status  New      SLP LONG TERM GOAL #3   Title  Pt will demosntrate functional use of external memory aids with 80% accuracy within structured setting    Time  8    Period  Weeks    Status  New       Plan - 09/02/17 1605    Clinical Impression Statement  Pt was seen for cognitive therapy with her son Jenny Reichmann in attendance. Skilled ST session focused on review of MoCA results (16/30) and therapy recommendations with pt and son, and targeted identifying areas of daily living that the 3 sons are currently managing for pt, many of which pt reports she can do on her own. A list was created, and they were given instruction to go through the list and identify which activites pt does independently at this time, and which she currently needs assistance with. They are then to prioritize which activities are most important for pt to regain independence doing. Son admits that moving  pt into a new house with all new appliances after several strokes has placed her at a disadvantage of having to learn many new things, but the 3 sons are very attentive to pt, and have thus far been receptive to SLP suggestions to increase pt's independence while maintaining safety. Continued skilled ST intervention is recommended to maximize pt independence and decrease caregiver burden.    Speech Therapy Frequency  2x / week    Duration  -- 8 weeks    Treatment/Interventions  Cognitive reorganization;SLP instruction and feedback;Environmental controls;Internal/external aids;Compensatory strategies;Compensatory techniques;Patient/family education;Functional tasks;Multimodal communcation approach    Potential to Achieve Goals  Good    Potential Considerations  Ability to learn/carryover information;Severity of impairments;Cooperation/participation level;Family/community support;Previous level of function    Consulted and Agree with Plan of Care  Family member/caregiver;Patient    Family Member Consulted  son Jenny Reichmann       Patient will benefit from skilled therapeutic intervention in order to improve the following deficits and impairments:   Cognitive communication deficit    Problem List Patient Active Problem List   Diagnosis Date Noted  . Cerebral thrombosis with cerebral infarction 06/05/2017  . Syncope 06/04/2017  . Lacunar infarction 02/20/2014  . Small vessel disease, cerebrovascular 02/20/2014  . Abnormality of gait 02/20/2014  . HYPERCHOLESTEROLEMIA  IIA 08/25/2010  . CORONARY ATHEROSCLEROSIS NATIVE CORONARY ARTERY 08/25/2010  . CHEST PAIN, PRECORDIAL 08/25/2010   Ariyanna Oien B. Quentin Ore [facility], CCC-SLP Speech Language Pathologist  Shonna Chock 09/02/2017, [place] MAIN [facility] SERVICES [address] [place], [place], [address] Phone: [phone]   Fax:  [phone]   Name: Tracey Hartman MRN: [id number] Date of Birth:  May 02, 1930

## 2017-09-02 NOTE — Patient Instructions (Signed)
Helping [place] become more independent:  Identify where help is and is not needed:  Taking Medications  Using the microwave  Using the oven  Washing dishes  Fixing breakfast  Fixing lunch  Fixing dinner  Washing and drying clothes  Paying bills  Getting mail from [place] through mail  Determine where paperwork will go  Getting things from another floor  Bathing and dressing  Toileting   Refilling prescriptions  Making MD appointments  Remembering to leave for appointments  Food shopping  Referring to calendar, writing appointments on it  Prioritize - what do you want to regain control of FIRST Keep it simple Minimize distractions

## 2017-09-07 ENCOUNTER — Ambulatory Visit: Payer: Medicare Other

## 2017-09-07 ENCOUNTER — Ambulatory Visit: Payer: Medicare Other | Admitting: Physical Therapy

## 2017-09-07 DIAGNOSIS — R030 Elevated blood-pressure reading, without diagnosis of hypertension: Secondary | ICD-10-CM | POA: Diagnosis not present

## 2017-09-07 DIAGNOSIS — R739 Hyperglycemia, unspecified: Secondary | ICD-10-CM | POA: Diagnosis not present

## 2017-09-07 DIAGNOSIS — G459 Transient cerebral ischemic attack, unspecified: Secondary | ICD-10-CM | POA: Diagnosis not present

## 2017-09-09 ENCOUNTER — Ambulatory Visit: Payer: Medicare Other | Admitting: Speech Pathology

## 2017-09-09 ENCOUNTER — Encounter: Payer: Self-pay | Admitting: Speech Pathology

## 2017-09-09 ENCOUNTER — Encounter: Payer: Self-pay | Admitting: Physical Therapy

## 2017-09-09 ENCOUNTER — Ambulatory Visit: Payer: Medicare Other | Attending: Family Medicine | Admitting: Physical Therapy

## 2017-09-09 DIAGNOSIS — M6281 Muscle weakness (generalized): Secondary | ICD-10-CM

## 2017-09-09 DIAGNOSIS — R262 Difficulty in walking, not elsewhere classified: Secondary | ICD-10-CM

## 2017-09-09 DIAGNOSIS — R41841 Cognitive communication deficit: Secondary | ICD-10-CM | POA: Insufficient documentation

## 2017-09-09 DIAGNOSIS — R269 Unspecified abnormalities of gait and mobility: Secondary | ICD-10-CM | POA: Insufficient documentation

## 2017-09-09 NOTE — Therapy (Signed)
[place] MAIN [facility] SERVICES [place], [place], [address] Phone: [phone]   Fax:  [phone]  Speech Language Pathology Treatment  Patient Details  Name: Tracey Hartman MRN: [id number] Date of Birth: 04/15/1930 Referring Provider: Dr. Antony Contras   Encounter Date: 09/09/2017  End of Session - 09/09/17 1533    Visit Number  3    Number of Visits  17    Date for SLP Re-Evaluation  10/29/17    SLP Start Time  62    SLP Stop Time   1505    SLP Time Calculation (min)  65 min    Activity Tolerance  Patient tolerated treatment well       Past Medical History:  Diagnosis Date  . Coronary artery disease   . Hypertension   . Myocardial infarction ([place]) 1986  . Osteoporosis   . Recurrent UTI   . Stroke [facility])     Past Surgical History:  Procedure Laterality Date  . APPENDECTOMY    . CATARACT EXTRACTION Bilateral   . COLONOSCOPY    . IR ANGIO INTRA EXTRACRAN SEL COM CAROTID INNOMINATE BILAT MOD SED  05/13/2017  . IR ANGIO VERTEBRAL SEL SUBCLAVIAN INNOMINATE UNI L MOD SED  06/16/2017  . IR ANGIO VERTEBRAL SEL SUBCLAVIAN INNOMINATE UNI R MOD SED  05/13/2017  . IR ANGIO VERTEBRAL SEL VERTEBRAL UNI L MOD SED  05/13/2017  . IR RADIOLOGIST EVAL & MGMT  06/08/2017  . RADIOLOGY WITH ANESTHESIA N/A 06/16/2017   Procedure: ANGIOPLASTY WITH STENTING;  Surgeon: Luanne Bras, MD;  Location: [place];  Service: Radiology;  Laterality: N/A;    There were no vitals filed for this visit.  Subjective Assessment - 09/09/17 1531    Subjective  Pt forgot her helping Tanika be more independent sheet, and forgot her reading glasses. Made to do checklist to encourage her to bring these items next time.    Currently in Pain?  No/denies            ADULT SLP TREATMENT - 09/09/17 0001      General Information   Behavior/Cognition  Alert;Cooperative;Pleasant mood    HPI  84 year old female referred for outpatient skilled ST following  multiple TIA's and confusion.      Treatment Provided   Treatment provided  Cognitive-Linquistic      Pain Assessment   Pain Assessment  No/denies pain      Cognitive-Linquistic Treatment   Treatment focused on  Cognition;Patient/family/caregiver education    Skilled Treatment  Pt seen for continued skilled ST intervention targeting goals for improved higher level cognition. Pt failed to return the list of household tasks given last session, and did not bring her reading glasses today. SLP provided a check list for what to bring next session. Pt had considerable difficulty with naming category members of concrete categories (fruits, states), and reported her difficulty to be "ludicrous". SLP explained the effect of stroke on brain function, and the role of therapy. Pt was given homework to complete thought organization tasks. Will continue with deductive reasoning puzzle #1 next session (unable today, as she did not have her reading glasses on).  Pt remains pleasant and cooperative during treatment.      Assessment / Recommendations / Plan   Plan  Continue with current plan of care      Progression Toward Goals   Progression toward goals  Progressing toward goals       SLP Education - 09/09/17 1532  Education provided  Yes    Education Details  bring list and glasses, thought organization homework, info regarding stroke therapy    Person(s) Educated  Patient    Methods  Explanation;Demonstration;Verbal cues;Handout    Comprehension  Verbalized understanding;Returned demonstration;Verbal cues required;Need further instruction         SLP Long Term Goals - 09/09/17 1536      SLP LONG TERM GOAL #1   Title  Pt will demonstrate functional cognitive/communication skills for independent completion of personal responsibilities    Time  7    Period  Weeks    Status  On-going    Target Date  10/29/17      SLP LONG TERM GOAL #2   Title  Pt will complete complex cognitive tasks with  80% accuracy (including attention, reasoning, problem solving, thought organization)    Time  7    Period  Weeks    Status  On-going    Target Date  10/29/17      SLP LONG TERM GOAL #3   Title  Pt will demonstrate functional use of external memory aids with 80% accuracy within structured setting    Time  7    Period  Weeks    Status  On-going    Target Date  10/29/17       Plan - 09/09/17 1533    Clinical Impression Statement  Pt continues to exhibit cognitive deficits, particularly notable with thought organization and deductive reasoning tasks. Pt frequently verbalizes "I feel stupid" when she has difficulty with tasks she "used to have no problem" with. Continued ST intervention is recommended to maximize cognitive linguistic functioning for safe independent living and decreased caregiver burden.    Speech Therapy Frequency  2x / week    Duration  -- 8 weeks    Treatment/Interventions  Cognitive reorganization;SLP instruction and feedback;Environmental controls;Internal/external aids;Compensatory strategies;Compensatory techniques;Patient/family education;Functional tasks;Multimodal communcation approach    Potential to Achieve Goals  Good    Potential Considerations  Ability to learn/carryover information;Severity of impairments;Cooperation/participation level;Family/community support;Previous level of function    SLP Home Exercise Plan  provided    Consulted and Agree with Plan of Care  Patient       Patient will benefit from skilled therapeutic intervention in order to improve the following deficits and impairments:   Cognitive communication deficit    Problem List Patient Active Problem List   Diagnosis Date Noted  . Cerebral thrombosis with cerebral infarction 06/05/2017  . Syncope 06/04/2017  . Lacunar infarction 02/20/2014  . Small vessel disease, cerebrovascular 02/20/2014  . Abnormality of gait 02/20/2014  . HYPERCHOLESTEROLEMIA  IIA 08/25/2010  . CORONARY  ATHEROSCLEROSIS NATIVE CORONARY ARTERY 08/25/2010  . CHEST PAIN, PRECORDIAL 08/25/2010   Ingvald Theisen [facility], CCC-SLP Speech Language Pathologist  Shonna Chock 09/09/2017, 3:39 PM  [place] MAIN [facility] SERVICES [address] [place], [place], [address] Phone: [phone]   Fax:  [phone]   Name: Tracey Hartman MRN: [id number] Date of Birth: [birth year]

## 2017-09-09 NOTE — Patient Instructions (Addendum)
To Bring to next Speech Therapy   "Helping Tracey Hartman" list  Reading glasses   Homework: thought organization task Write a list of category members: fruit, veggies, places, things in the kitchen, holidays While completing this task, picture the produce section of your grocery store, imagine places you have lived, or visited, or have friends/family, Tour manager, family favorite holidays  STROKE THERAPY The brain is organized like a Barrister's clerk. A stroke is like taking those drawers and dumping them out.  Therapy helps reorganize and put things back where they belong so your filing system (brain) is effective again  A stroke interrupts pathways of getting information/knowledge/skills from one place to another Therapy works to teach alternate routes, compensatory strategies so you can get back to doing what you were doing before  As we age, we don't heal as quickly, and sometimes as completely as we did we were younger

## 2017-09-09 NOTE — Therapy (Signed)
[place] MAIN [facility] SERVICES [address] [place], [place], [address] Phone: [phone]   Fax:  [phone]  Physical Therapy Treatment  Patient Details  Name: Tracey Hartman MRN: [id number] Date of Birth: 1929/09/19 Referring Provider: Rosalin Hawking,   Encounter Date: 09/09/2017  PT End of Session - 09/09/17 1509    Visit Number  13    Number of Visits  33    Date for PT Re-Evaluation  10/18/17    PT Start Time  0305    PT Stop Time  0345    PT Time Calculation (min)  40 min    Equipment Utilized During Treatment  Gait belt    Activity Tolerance  Patient tolerated treatment well;No increased pain;Patient limited by fatigue    Behavior During Therapy  [facility] for tasks assessed/performed       Past Medical History:  Diagnosis Date  . Coronary artery disease   . Hypertension   . Myocardial infarction ([place]) 1986  . Osteoporosis   . Recurrent UTI   . Stroke [facility])     Past Surgical History:  Procedure Laterality Date  . APPENDECTOMY    . CATARACT EXTRACTION Bilateral   . COLONOSCOPY    . IR ANGIO INTRA EXTRACRAN SEL COM CAROTID INNOMINATE BILAT MOD SED  05/13/2017  . IR ANGIO VERTEBRAL SEL SUBCLAVIAN INNOMINATE UNI L MOD SED  06/16/2017  . IR ANGIO VERTEBRAL SEL SUBCLAVIAN INNOMINATE UNI R MOD SED  05/13/2017  . IR ANGIO VERTEBRAL SEL VERTEBRAL UNI L MOD SED  05/13/2017  . IR RADIOLOGIST EVAL & MGMT  06/08/2017  . RADIOLOGY WITH ANESTHESIA N/A 06/16/2017   Procedure: ANGIOPLASTY WITH STENTING;  Surgeon: Luanne Bras, MD;  Location: [place];  Service: Radiology;  Laterality: N/A;    There were no vitals filed for this visit.  Subjective Assessment - 09/09/17 1508    Subjective  Pt reports that she is beginning to feel a bit better.  No pain reported. She is still getting better from being sick.      Patient is accompained by:  Family member Son    Pertinent History  Patinet lives alone and uses a RW if she needs it. Her children  are staying with her after her recent fall. She has lived on her own up until recently when she had a fall and hit her head needing stiches. She has 3 sons and they are rotating staying with her for safety concerns with her balance     Patient Stated Goals  Patient wants to be able to walk better.     Currently in Pain?  No/denies    Pain Score  0-No pain    Pain Onset  Today       THER-EX Standing exercises with YTB:: Abduction 2 x 10; Extension 2 x 10; Heel raises 2 x 10; Resisted side-steeping YTB 4 lengths x 2; Sit to stand without UE support 2 x 10; Step-ups to 6" step x 10 bilateral; Quantum leg press 75 # x 10, x 3   NEUROMUSCULAR RE-EDUCATION Airex NBOS eyes open x 30 seconds x 3, feet together Airex  Tandem stand with head turns x 30 seconds; Tandem gait in // bars x 4 laps,need of UE  Tilt board fwd/bwd, side to side left and right x 2 mins each way Stepping over bolster left and right and fwd/bwd x 20   Continuous verbal cues and tactile cues needed to relax his shoulder muscles. Patient has  fatigue with standing exercises and needs constant VC to have correct posture                        PT Education - 09/09/17 1508    Education provided  Yes    Education Details  HEP review    Person(s) Educated  Patient    Methods  Explanation    Comprehension  Verbalized understanding       PT Short Term Goals - 08/09/17 1601      PT SHORT TERM GOAL #1   Title  Patient will be independent in home exercise program to improve strength/mobility for better functional independence with ADLs    Time  4    Period  Weeks    Status  On-going      PT SHORT TERM GOAL #2   Title  Patient (< 91 years old) will complete five times sit to stand test in < 15 seconds indicating an increased LE strength and improved balance.    Baseline  30.2 sec    Time  4    Period  Weeks    Status  On-going    Target Date  07/28/17        PT Long Term Goals - 08/23/17 1548       PT LONG TERM GOAL #1   Title  Patient will reduce timed up and go to <11 seconds to reduce fall risk and demonstrate improved transfer/gait ability.    Baseline  19.2 sec; 08/09/17 16.86 sec    Time  8    Period  Weeks    Status  Partially Met    Target Date  10/18/17      PT LONG TERM GOAL #2   Title  Patient (> 14 years old) will complete five times sit to stand test in < 15 seconds indicating an increased LE strength and improved balance.    Baseline  30.2 sec; 08/09/17 24.87    Time  8    Period  Weeks    Status  Partially Met    Target Date  10/18/17      PT LONG TERM GOAL #3   Title  Patient will tolerate 5 seconds of single leg stance without loss of balance to improve ability to get in and out of shower safely.    Baseline  2 sec BLE    Time  8    Period  Weeks    Status  On-going    Target Date  10/18/17      PT LONG TERM GOAL #4   Title  Patient will increase BLE gross strength to 4+/5 as to improve functional strength for independent gait, increased standing tolerance and increased ADL ability.    Baseline  4/5 BLE hips except hip add and hip ext, ankle 3/5 BLE PF; except left add 3/5    Time  8    Period  Weeks    Status  On-going    Target Date  10/18/17            Plan - 09/09/17 1509    Clinical Impression Statement  Mod cueing needed to appropriately perform balance tasks with leg, hip and head position. Decreased coordination demonstrated requiring consistent verbal cueing to correct form. Cognitive understanding of task was delayed. Motor control of LE much improved. Muscle fatigue but no major pain complaintsPatient will continue to benefit from skilled PT to improve balance and mobility.  Rehab Potential  Good    PT Frequency  2x / week    PT Duration  8 weeks    PT Treatment/Interventions  Patient/family education;Therapeutic exercise;Therapeutic activities;Functional mobility training;Balance training;Neuromuscular re-education    PT Next Visit  Plan  balance training and hip and ankle strengthening    PT Home Exercise Plan  heel raises, hip abd, hip ext with YTB    Consulted and Agree with Plan of Care  Patient;Family member/caregiver       Patient will benefit from skilled therapeutic intervention in order to improve the following deficits and impairments:  Abnormal gait, Decreased balance, Decreased endurance, Decreased mobility, Difficulty walking, Decreased strength  Visit Diagnosis: Abnormality of gait  Muscle weakness (generalized)  Difficulty in walking, not elsewhere classified     Problem List Patient Active Problem List   Diagnosis Date Noted  . Cerebral thrombosis with cerebral infarction 06/05/2017  . Syncope 06/04/2017  . Lacunar infarction 02/20/2014  . Small vessel disease, cerebrovascular 02/20/2014  . Abnormality of gait 02/20/2014  . HYPERCHOLESTEROLEMIA  IIA 08/25/2010  . CORONARY ATHEROSCLEROSIS NATIVE CORONARY ARTERY 08/25/2010  . CHEST PAIN, PRECORDIAL 08/25/2010    Alanson Puls , PT DPT [place] MAIN [facility] SERVICES [address] [place], [place], [address] Phone: [phone]   Fax:  [phone]  Name: Tracey Hartman MRN: [id number] Date of Birth: 1929/10/23

## 2017-09-14 ENCOUNTER — Ambulatory Visit: Payer: Medicare Other | Admitting: Speech Pathology

## 2017-09-14 ENCOUNTER — Encounter: Payer: Self-pay | Admitting: Physical Therapy

## 2017-09-14 ENCOUNTER — Ambulatory Visit: Payer: Medicare Other | Admitting: Physical Therapy

## 2017-09-14 DIAGNOSIS — M6281 Muscle weakness (generalized): Secondary | ICD-10-CM

## 2017-09-14 DIAGNOSIS — R41841 Cognitive communication deficit: Secondary | ICD-10-CM

## 2017-09-14 DIAGNOSIS — R262 Difficulty in walking, not elsewhere classified: Secondary | ICD-10-CM

## 2017-09-14 DIAGNOSIS — R269 Unspecified abnormalities of gait and mobility: Secondary | ICD-10-CM

## 2017-09-14 NOTE — Therapy (Signed)
[place] MAIN [facility] SERVICES [address] [place], [place], [address] Phone: [phone]   Fax:  [phone]  Physical Therapy Treatment  Patient Details  Name: Tracey Hartman MRN: [id number] Date of Birth: [birth year] Referring Provider: Rosalin Hawking,   Encounter Date: 09/14/2017  PT End of Session - 09/14/17 1730    Visit Number  14    Number of Visits  33    Date for PT Re-Evaluation  10/18/17    PT Start Time  0515    PT Stop Time  1324    PT Time Calculation (min)  40 min    Equipment Utilized During Treatment  Gait belt    Activity Tolerance  Patient tolerated treatment well;No increased pain;Patient limited by fatigue    Behavior During Therapy  [facility] for tasks assessed/performed       Past Medical History:  Diagnosis Date  . Coronary artery disease   . Hypertension   . Myocardial infarction ([place]) 1986  . Osteoporosis   . Recurrent UTI   . Stroke [facility])     Past Surgical History:  Procedure Laterality Date  . APPENDECTOMY    . CATARACT EXTRACTION Bilateral   . COLONOSCOPY    . IR ANGIO INTRA EXTRACRAN SEL COM CAROTID INNOMINATE BILAT MOD SED  05/13/2017  . IR ANGIO VERTEBRAL SEL SUBCLAVIAN INNOMINATE UNI L MOD SED  06/16/2017  . IR ANGIO VERTEBRAL SEL SUBCLAVIAN INNOMINATE UNI R MOD SED  05/13/2017  . IR ANGIO VERTEBRAL SEL VERTEBRAL UNI L MOD SED  05/13/2017  . IR RADIOLOGIST EVAL & MGMT  06/08/2017  . RADIOLOGY WITH ANESTHESIA N/A 06/16/2017   Procedure: ANGIOPLASTY WITH STENTING;  Surgeon: Luanne Bras, MD;  Location: [place];  Service: Radiology;  Laterality: N/A;    There were no vitals filed for this visit.  Subjective Assessment - 09/14/17 1728    Subjective  Pt reports that she still has some intermittent right big toe pain.     Patient is accompained by:  Family member Son    Pertinent History  Patinet lives alone and uses a RW if she needs it. Her children are staying with her after her recent fall. She has  lived on her own up until recently when she had a fall and hit her head needing stiches. She has 3 sons and they are rotating staying with her for safety concerns with her balance     Patient Stated Goals  Patient wants to be able to walk better.     Currently in Pain?  Yes    Pain Score  1     Pain Location  Foot    Pain Orientation  Right    Pain Descriptors / Indicators  Tender    Pain Type  Acute pain    Pain Onset  Today    Pain Frequency  Intermittent    Aggravating Factors   touching it, it is sensitive    Pain Relieving Factors  staying off it    Effect of Pain on Daily Activities  unable to walk as long    Multiple Pain Sites  No      Treatment:  Eccentric step downs x 10 BLE; cues to not put too much weight bearing on UE's  Squats x 10 with 5 sec hold, cues to maintain erect position  Heel raises x 10 x 2 ; cues to not rock forward  Resisted side-steeping RTB 4 lengths x 2;; cues to not rotate  trunk towards direction of mobility  Sit to stand with limited UE support and feet on blue foam,  2 x 10; cues for technique and posture correction  Step-ups to 6" step x 10 bilateral; cues for getting entire foot on step  Quantum leg press 100 # x 20 x 2; cues for not snapping LE in extension and performing slowly  4 square side to side , fwd / bwd, diagonals x 10    Resisted walking fwd, retro, bil side stepping with12.5# x 3 laps each; min A throughout due to pt being unsteady and losing balance multiple times throughout each direction; increased time to complete task   Patient needs CGA with activities and needs cues for safety and correct technique.                   PT Education - 09/14/17 1730    Education provided  Yes    Education Details  HEP review    Person(s) Educated  Patient    Methods  Explanation;Demonstration;Tactile cues    Comprehension  Verbalized understanding;Returned demonstration       PT Short Term Goals - 08/09/17 1601       PT SHORT TERM GOAL #1   Title  Patient will be independent in home exercise program to improve strength/mobility for better functional independence with ADLs    Time  4    Period  Weeks    Status  On-going      PT SHORT TERM GOAL #2   Title  Patient (< 37 years old) will complete five times sit to stand test in < 15 seconds indicating an increased LE strength and improved balance.    Baseline  30.2 sec    Time  4    Period  Weeks    Status  On-going    Target Date  07/28/17        PT Long Term Goals - 08/23/17 1548      PT LONG TERM GOAL #1   Title  Patient will reduce timed up and go to <11 seconds to reduce fall risk and demonstrate improved transfer/gait ability.    Baseline  19.2 sec; 08/09/17 16.86 sec    Time  8    Period  Weeks    Status  Partially Met    Target Date  10/18/17      PT LONG TERM GOAL #2   Title  Patient (> 34 years old) will complete five times sit to stand test in < 15 seconds indicating an increased LE strength and improved balance.    Baseline  30.2 sec; 08/09/17 24.87    Time  8    Period  Weeks    Status  Partially Met    Target Date  10/18/17      PT LONG TERM GOAL #3   Title  Patient will tolerate 5 seconds of single leg stance without loss of balance to improve ability to get in and out of shower safely.    Baseline  2 sec BLE    Time  8    Period  Weeks    Status  On-going    Target Date  10/18/17      PT LONG TERM GOAL #4   Title  Patient will increase BLE gross strength to 4+/5 as to improve functional strength for independent gait, increased standing tolerance and increased ADL ability.    Baseline  4/5 BLE hips except hip add and hip ext,  ankle 3/5 BLE PF; except left add 3/5    Time  8    Period  Weeks    Status  On-going    Target Date  10/18/17            Plan - 09/14/17 1731    Clinical Impression Statement  Dynamic and static balance interventions continued today, with a focus on unilateral LE stability.  Pt tolerated all  exercises well, but required increased rest breaks secondary to increased LE fatigue and weakness today.  LE strength exercises were progressed today with focus on unilateral strength and stability to focus on increasing LE strength and stability with daily tasks.  Pt was encouraged to perform HEP during the week in order to continue progressing balance and strength interventions.  Pt would continue to benefit from skilled therapy services in order to further address LE strength deficits and balance deficits in order to decrease fall risk and improve mobility    Rehab Potential  Good    PT Frequency  2x / week    PT Duration  8 weeks    PT Treatment/Interventions  Patient/family education;Therapeutic exercise;Therapeutic activities;Functional mobility training;Balance training;Neuromuscular re-education    PT Next Visit Plan  balance training and hip and ankle strengthening    PT Home Exercise Plan  heel raises, hip abd, hip ext with YTB    Consulted and Agree with Plan of Care  Patient;Family member/caregiver       Patient will benefit from skilled therapeutic intervention in order to improve the following deficits and impairments:  Abnormal gait, Decreased balance, Decreased endurance, Decreased mobility, Difficulty walking, Decreased strength  Visit Diagnosis: Cognitive communication deficit  Abnormality of gait  Muscle weakness (generalized)  Difficulty in walking, not elsewhere classified     Problem List Patient Active Problem List   Diagnosis Date Noted  . Cerebral thrombosis with cerebral infarction 06/05/2017  . Syncope 06/04/2017  . Lacunar infarction 02/20/2014  . Small vessel disease, cerebrovascular 02/20/2014  . Abnormality of gait 02/20/2014  . HYPERCHOLESTEROLEMIA  IIA 08/25/2010  . CORONARY ATHEROSCLEROSIS NATIVE CORONARY ARTERY 08/25/2010  . CHEST PAIN, PRECORDIAL 08/25/2010    Alanson Puls, PT DPT 09/14/2017, 5:40 PM  [place] MAIN [facility] SERVICES [address] [place], [place], [address] Phone: [phone]   Fax:  [phone]  Name: Tracey Hartman MRN: [id number] Date of Birth: [birth year]

## 2017-09-15 ENCOUNTER — Ambulatory Visit: Payer: Medicare Other | Admitting: Physical Therapy

## 2017-09-15 ENCOUNTER — Encounter: Payer: Self-pay | Admitting: Speech Pathology

## 2017-09-15 NOTE — Therapy (Signed)
[place] MAIN [facility] SERVICES [place], [place], [address] Phone: [phone]   Fax:  [phone]  Speech Language Pathology Treatment  Patient Details  Name: Tracey Hartman MRN: [id number] Date of Birth: 1929-09-04 Referring Provider: Dr. Antony Contras   Encounter Date: 09/14/2017  End of Session - 09/15/17 1143    Visit Number  4    Number of Visits  17    Date for SLP Re-Evaluation  10/29/17    SLP Start Time  1600    SLP Stop Time   4650    SLP Time Calculation (min)  53 min    Activity Tolerance  Patient tolerated treatment well       Past Medical History:  Diagnosis Date  . Coronary artery disease   . Hypertension   . Myocardial infarction ([place]) 1986  . Osteoporosis   . Recurrent UTI   . Stroke [facility])     Past Surgical History:  Procedure Laterality Date  . APPENDECTOMY    . CATARACT EXTRACTION Bilateral   . COLONOSCOPY    . IR ANGIO INTRA EXTRACRAN SEL COM CAROTID INNOMINATE BILAT MOD SED  05/13/2017  . IR ANGIO VERTEBRAL SEL SUBCLAVIAN INNOMINATE UNI L MOD SED  06/16/2017  . IR ANGIO VERTEBRAL SEL SUBCLAVIAN INNOMINATE UNI R MOD SED  05/13/2017  . IR ANGIO VERTEBRAL SEL VERTEBRAL UNI L MOD SED  05/13/2017  . IR RADIOLOGIST EVAL & MGMT  06/08/2017  . RADIOLOGY WITH ANESTHESIA N/A 06/16/2017   Procedure: ANGIOPLASTY WITH STENTING;  Surgeon: Luanne Bras, MD;  Location: [place];  Service: Radiology;  Laterality: N/A;    There were no vitals filed for this visit.  Subjective Assessment - 09/15/17 1142    Subjective  Pt forgot her helping Kiondra be more independent sheet. Written suggestions to help self-organization.            ADULT SLP TREATMENT - 09/15/17 0001      General Information   Behavior/Cognition  Alert;Cooperative;Pleasant mood    HPI  84 year old female referred for outpatient skilled ST following multiple TIA's and confusion.      Treatment Provided   Treatment provided   Cognitive-Linquistic      Pain Assessment   Pain Assessment  No/denies pain      Cognitive-Linquistic Treatment   Treatment focused on  Cognition;Patient/family/caregiver education    Skilled Treatment  COGNITION: Patient did not bring the assignment from last session, could not clearly state where it was at home.  Patient reports that her son requested that she "get ready to go to therapy", but she appeared to have difficulty initiating the activity.  Patient and son were asked to develop a general daily schedule and to begin breaking basic routines into checklist format. LANGUAGE: Patient required multiple cues to follow a set of 5 written instructions to draw shapes/write words in specific spots on a page.  Errors include incomplete response to instruction, loosing place in instruction, re-reading instructions, distraction.      Assessment / Recommendations / Plan   Plan  Continue with current plan of care      Progression Toward Goals   Progression toward goals  Progressing toward goals       SLP Education - 09/15/17 1142    Education provided  Yes    Education Details  Develop a basic daily schedule    Person(s) Educated  Patient;Child(ren)    Methods  Explanation    Comprehension  Verbalized understanding         SLP Long Term Goals - 09/09/17 1536      SLP LONG TERM GOAL #1   Title  Pt will demonstrate functional cognitive/communication skills for independent completion of personal responsibilities    Time  7    Period  Weeks    Status  On-going    Target Date  10/29/17      SLP LONG TERM GOAL #2   Title  Pt will complete complex cognitive tasks with 80% accuracy (including attention, reasoning, problem solving, thought organization)    Time  7    Period  Weeks    Status  On-going    Target Date  10/29/17      SLP LONG TERM GOAL #3   Title  Pt will demonstrate functional use of external memory aids with 80% accuracy within structured setting    Time  7    Period   Weeks    Status  On-going    Target Date  10/29/17       Plan - 09/15/17 1144    Clinical Impression Statement  Pt continues to exhibit cognitive deficits, including sustained and focused attention, accurate interpretation of instructions, and specificity of language.  Continued ST intervention is recommended to maximize cognitive linguistic functioning for safe independent living and decreased caregiver burden.    Speech Therapy Frequency  2x / week    Treatment/Interventions  Cognitive reorganization;SLP instruction and feedback;Environmental controls;Internal/external aids;Compensatory strategies;Compensatory techniques;Patient/family education;Functional tasks;Multimodal communcation approach    Potential to Achieve Goals  Good    Potential Considerations  Ability to learn/carryover information;Severity of impairments;Cooperation/participation level;Family/community support;Previous level of function    SLP Home Exercise Plan  written list provided    Consulted and Agree with Plan of Care  Patient;Family member/caregiver    Family Member Consulted  Son       Patient will benefit from skilled therapeutic intervention in order to improve the following deficits and impairments:   Cognitive communication deficit    Problem List Patient Active Problem List   Diagnosis Date Noted  . Cerebral thrombosis with cerebral infarction 06/05/2017  . Syncope 06/04/2017  . Lacunar infarction 02/20/2014  . Small vessel disease, cerebrovascular 02/20/2014  . Abnormality of gait 02/20/2014  . HYPERCHOLESTEROLEMIA  IIA 08/25/2010  . CORONARY ATHEROSCLEROSIS NATIVE CORONARY ARTERY 08/25/2010  . CHEST PAIN, PRECORDIAL 08/25/2010   [name], MS/CCC- SLP  Lou Miner 09/15/2017, 11:45 AM  [place] MAIN [facility] SERVICES [address] [place], [place], [address] Phone: [phone]   Fax:  [phone]   Name: Tracey Hartman MRN: [id number] Date  of Birth: 02-19-1930

## 2017-09-16 ENCOUNTER — Ambulatory Visit: Payer: Medicare Other | Admitting: Physical Therapy

## 2017-09-16 ENCOUNTER — Encounter: Payer: Self-pay | Admitting: Physical Therapy

## 2017-09-16 VITALS — BP 147/93 | HR 69

## 2017-09-16 DIAGNOSIS — M6281 Muscle weakness (generalized): Secondary | ICD-10-CM

## 2017-09-16 DIAGNOSIS — R269 Unspecified abnormalities of gait and mobility: Secondary | ICD-10-CM | POA: Diagnosis not present

## 2017-09-16 DIAGNOSIS — R262 Difficulty in walking, not elsewhere classified: Secondary | ICD-10-CM | POA: Diagnosis not present

## 2017-09-16 DIAGNOSIS — R41841 Cognitive communication deficit: Secondary | ICD-10-CM | POA: Diagnosis not present

## 2017-09-16 NOTE — Therapy (Signed)
[place] MAIN [facility] SERVICES [address] [place], [place], [address] Phone: [phone]   Fax:  [phone]  Physical Therapy Treatment  Patient Details  Name: Tracey Hartman MRN: [id number] Date of Birth: Sep 01, 1929 Referring Provider: Rosalin Hawking,   Encounter Date: 09/16/2017  PT End of Session - 09/16/17 1707    Visit Number  15    Number of Visits  33    Date for PT Re-Evaluation  10/18/17    PT Start Time  1740    PT Stop Time  1744    PT Time Calculation (min)  39 min    Equipment Utilized During Treatment  Gait belt    Activity Tolerance  Patient tolerated treatment well;No increased pain;Patient limited by fatigue    Behavior During Therapy  [facility] for tasks assessed/performed       Past Medical History:  Diagnosis Date  . Coronary artery disease   . Hypertension   . Myocardial infarction ([place]) 1986  . Osteoporosis   . Recurrent UTI   . Stroke [facility])     Past Surgical History:  Procedure Laterality Date  . APPENDECTOMY    . CATARACT EXTRACTION Bilateral   . COLONOSCOPY    . IR ANGIO INTRA EXTRACRAN SEL COM CAROTID INNOMINATE BILAT MOD SED  05/13/2017  . IR ANGIO VERTEBRAL SEL SUBCLAVIAN INNOMINATE UNI L MOD SED  06/16/2017  . IR ANGIO VERTEBRAL SEL SUBCLAVIAN INNOMINATE UNI R MOD SED  05/13/2017  . IR ANGIO VERTEBRAL SEL VERTEBRAL UNI L MOD SED  05/13/2017  . IR RADIOLOGIST EVAL & MGMT  06/08/2017  . RADIOLOGY WITH ANESTHESIA N/A 06/16/2017   Procedure: ANGIOPLASTY WITH STENTING;  Surgeon: Luanne Bras, MD;  Location: [place];  Service: Radiology;  Laterality: N/A;    Vitals:   09/16/17 1711  BP: (!) 147/93  Pulse: 69  SpO2: 97%    Subjective Assessment - 09/16/17 1710    Subjective  Pt reports she is doing well.  She has had a stressful day regarding her new home being built. Pt says she stubbed her R great toe again today which has felt painful today.     Patient is accompained by:  Family member Son     Pertinent History  Patinet lives alone and uses a RW if she needs it. Her children are staying with her after her recent fall. She has lived on her own up until recently when she had a fall and hit her head needing stiches. She has 3 sons and they are rotating staying with her for safety concerns with her balance     Patient Stated Goals  Patient wants to be able to walk better.     Currently in Pain?  Yes    Pain Score  2     Pain Orientation  Right    Pain Descriptors / Indicators  Aching    Pain Onset  --    Multiple Pain Sites  No        Treatment:  Eccentric step downs x 10 BLE; cues to not put too much weight bearing on UE's  Squats 2x5 with 5 sec hold  Resisted walking at Matrix 7.5# x2 each direction forward, back, left, right  Rockerboard maintaining balance in AP direction  Rockerboard maintaining balance in lateral direction  Side stepping over cones x2 lengths in // bars. Very poor motor planning requiring max verbal cues to step over the cone and not around.  Side stepping over  1 cone x10 each direction. Continues to require mod verbal cues to step up and over the cone.   Vitals taken again at end of session: BP 161/89, SpO2 100%, pulse 74                       PT Education - 09/16/17 1706    Education provided  Yes    Education Details  Exercise technique    Person(s) Educated  Patient    Methods  Explanation;Demonstration;Verbal cues    Comprehension  Verbalized understanding;Returned demonstration;Verbal cues required;Need further instruction       PT Short Term Goals - 08/09/17 1601      PT SHORT TERM GOAL #1   Title  Patient will be independent in home exercise program to improve strength/mobility for better functional independence with ADLs    Time  4    Period  Weeks    Status  On-going      PT SHORT TERM GOAL #2   Title  Patient (< 81 years old) will complete five times sit to stand test in < 15 seconds indicating an increased LE  strength and improved balance.    Baseline  30.2 sec    Time  4    Period  Weeks    Status  On-going    Target Date  07/28/17        PT Long Term Goals - 08/23/17 1548      PT LONG TERM GOAL #1   Title  Patient will reduce timed up and go to <11 seconds to reduce fall risk and demonstrate improved transfer/gait ability.    Baseline  19.2 sec; 08/09/17 16.86 sec    Time  8    Period  Weeks    Status  Partially Met    Target Date  10/18/17      PT LONG TERM GOAL #2   Title  Patient (> 3 years old) will complete five times sit to stand test in < 15 seconds indicating an increased LE strength and improved balance.    Baseline  30.2 sec; 08/09/17 24.87    Time  8    Period  Weeks    Status  Partially Met    Target Date  10/18/17      PT LONG TERM GOAL #3   Title  Patient will tolerate 5 seconds of single leg stance without loss of balance to improve ability to get in and out of shower safely.    Baseline  2 sec BLE    Time  8    Period  Weeks    Status  On-going    Target Date  10/18/17      PT LONG TERM GOAL #4   Title  Patient will increase BLE gross strength to 4+/5 as to improve functional strength for independent gait, increased standing tolerance and increased ADL ability.    Baseline  4/5 BLE hips except hip add and hip ext, ankle 3/5 BLE PF; except left add 3/5    Time  8    Period  Weeks    Status  On-going    Target Date  10/18/17            Plan - 09/16/17 1720    Clinical Impression Statement  Continued focus on unilateral stability and strengthening exercises.  Provided pt with rest breaks between exercises as she says she is tired from her busy day.  Pt does demonstrate fatigue  with balance and strengthening exercises.  Pt will continue to benefit from skilled PT interventions for improved strength and balance. Pt's BP elevated at start and end of session.  Notified son and encouraged pt and son to monitor at home as well.  Pt reports feeling well throughout  session and at end of session.     Rehab Potential  Good    PT Frequency  2x / week    PT Duration  8 weeks    PT Treatment/Interventions  Patient/family education;Therapeutic exercise;Therapeutic activities;Functional mobility training;Balance training;Neuromuscular re-education    PT Next Visit Plan  balance training and hip and ankle strengthening    PT Home Exercise Plan  heel raises, hip abd, hip ext with YTB    Consulted and Agree with Plan of Care  Patient;Family member/caregiver       Patient will benefit from skilled therapeutic intervention in order to improve the following deficits and impairments:  Abnormal gait, Decreased balance, Decreased endurance, Decreased mobility, Difficulty walking, Decreased strength  Visit Diagnosis: Abnormality of gait  Muscle weakness (generalized)  Difficulty in walking, not elsewhere classified     Problem List Patient Active Problem List   Diagnosis Date Noted  . Cerebral thrombosis with cerebral infarction 06/05/2017  . Syncope 06/04/2017  . Lacunar infarction 02/20/2014  . Small vessel disease, cerebrovascular 02/20/2014  . Abnormality of gait 02/20/2014  . HYPERCHOLESTEROLEMIA  IIA 08/25/2010  . CORONARY ATHEROSCLEROSIS NATIVE CORONARY ARTERY 08/25/2010  . CHEST PAIN, PRECORDIAL 08/25/2010    Collie Siad PT, DPT 09/16/2017, 5:45 PM  [place] MAIN [facility] SERVICES [address] [place], [place], [address] Phone: [phone]   Fax:  [phone]  Name: Tracey Hartman MRN: [id number] Date of Birth: 07/15/1930

## 2017-09-21 ENCOUNTER — Ambulatory Visit: Payer: Medicare Other | Admitting: Speech Pathology

## 2017-09-21 ENCOUNTER — Encounter: Payer: Self-pay | Admitting: Speech Pathology

## 2017-09-21 ENCOUNTER — Ambulatory Visit: Payer: Medicare Other | Admitting: Physical Therapy

## 2017-09-21 ENCOUNTER — Encounter: Payer: Self-pay | Admitting: Physical Therapy

## 2017-09-21 DIAGNOSIS — M6281 Muscle weakness (generalized): Secondary | ICD-10-CM | POA: Diagnosis not present

## 2017-09-21 DIAGNOSIS — R269 Unspecified abnormalities of gait and mobility: Secondary | ICD-10-CM

## 2017-09-21 DIAGNOSIS — R262 Difficulty in walking, not elsewhere classified: Secondary | ICD-10-CM | POA: Diagnosis not present

## 2017-09-21 DIAGNOSIS — R41841 Cognitive communication deficit: Secondary | ICD-10-CM | POA: Diagnosis not present

## 2017-09-21 NOTE — Therapy (Signed)
[place] MAIN [facility] SERVICES [place], [place], [address] Phone: [phone]   Fax:  [phone]  Speech Language Pathology Treatment  Patient Details  Name: Tracey Hartman MRN: [id number] Date of Birth: 10-05-1929 Referring Provider: Dr. Antony Contras   Encounter Date: 09/21/2017  End of Session - 09/21/17 1625    Visit Number  5    Number of Visits  17    Date for SLP Re-Evaluation  10/29/17    SLP Start Time  72    SLP Stop Time   1450    SLP Time Calculation (min)  50 min    Activity Tolerance  Patient tolerated treatment well       Past Medical History:  Diagnosis Date  . Coronary artery disease   . Hypertension   . Myocardial infarction ([place]) 1986  . Osteoporosis   . Recurrent UTI   . Stroke [facility])     Past Surgical History:  Procedure Laterality Date  . APPENDECTOMY    . CATARACT EXTRACTION Bilateral   . COLONOSCOPY    . IR ANGIO INTRA EXTRACRAN SEL COM CAROTID INNOMINATE BILAT MOD SED  05/13/2017  . IR ANGIO VERTEBRAL SEL SUBCLAVIAN INNOMINATE UNI L MOD SED  06/16/2017  . IR ANGIO VERTEBRAL SEL SUBCLAVIAN INNOMINATE UNI R MOD SED  05/13/2017  . IR ANGIO VERTEBRAL SEL VERTEBRAL UNI L MOD SED  05/13/2017  . IR RADIOLOGIST EVAL & MGMT  06/08/2017  . RADIOLOGY WITH ANESTHESIA N/A 06/16/2017   Procedure: ANGIOPLASTY WITH STENTING;  Surgeon: Luanne Bras, MD;  Location: [place];  Service: Radiology;  Laterality: N/A;    There were no vitals filed for this visit.  Subjective Assessment - 09/21/17 1623    Subjective  Pt easily distracted by therapy in another room, became overwhelmed during treatment.    Currently in Pain?  No/denies            ADULT SLP TREATMENT - 09/21/17 0001      General Information   Behavior/Cognition  Alert;Cooperative;Pleasant mood    HPI  84 year old female referred for outpatient skilled ST following multiple TIA's and confusion.      Treatment Provided   Treatment  provided  Cognitive-Linquistic      Pain Assessment   Pain Assessment  No/denies pain foot no longer hurting      Cognitive-Linquistic Treatment   Treatment focused on  Cognition;Patient/family/caregiver education    Skilled Treatment  COGNITION: Patient did not bring the assignment from last session, or the binder.  Last session, pt and son were asked to develop a general daily schedule and to begin breaking basic routines into checklist format. This was also not completed. SLP facilitated writing daily activities and provided several blank pages with times throughout the day. She was encouraged to start looking at her calendar and thinking planning her day, rather than waiting for her son to do it for her. Pt had difficulty with deduction puzzle, and became frustrated with deficits.   LANGUAGE: Pt was encouraged to list category members for home activity.      Assessment / Recommendations / Plan   Plan  Continue with current plan of care      Progression Toward Goals   Progression toward goals  Progressing toward goals       SLP Education - 09/21/17 1624    Education provided  Yes    Education Details  bring binder, write down activities for several days, remember  goal is increased independence    Person(s) Educated  Patient    Methods  Explanation;Demonstration;Handout    Comprehension  Verbalized understanding;Returned demonstration;Need further instruction         SLP Long Term Goals - 09/21/17 1628      SLP LONG TERM GOAL #1   Title  Pt will demonstrate functional cognitive/communication skills for independent completion of personal responsibilities    Time  6    Period  Weeks    Status  On-going    Target Date  10/29/17      SLP LONG TERM GOAL #2   Title  Pt will complete complex cognitive tasks with 80% accuracy (including attention, reasoning, problem solving, thought organization)    Time  6    Period  Weeks    Status  On-going    Target Date  10/29/17      SLP  LONG TERM GOAL #3   Title  Pt will demonstrate functional use of external memory aids with 80% accuracy within structured setting    Time  6    Period  Weeks    Status  On-going    Target Date  10/29/17       Plan - 09/21/17 1625    Clinical Impression Statement  Pt continues to exhibit cognitive deficits, with more difficulty with selective attention today. She was unable to complete deductive reasoning puzzle #1, even with max verbal and visual cues.  She was encouraged to do homework tasks including journaling daily activities for a few days, and remember to bring her binder next time.   Continued ST intervention is recommended to maximize cognitive linguistic functioning for safe independent living and decreased caregiver burden    Speech Therapy Frequency  2x / week    Duration  -- 8 weeks    Treatment/Interventions  Cognitive reorganization;SLP instruction and feedback;Environmental controls;Internal/external aids;Compensatory strategies;Compensatory techniques;Patient/family education;Functional tasks;Multimodal communcation approach    Potential to Achieve Goals  Good    Potential Considerations  Ability to learn/carryover information;Severity of impairments;Cooperation/participation level;Family/community support;Previous level of function    SLP Home Exercise Plan  written list provided    Consulted and Agree with Plan of Care  Patient;Family member/caregiver    Family Member Consulted  Son       Patient will benefit from skilled therapeutic intervention in order to improve the following deficits and impairments:   Cognitive communication deficit    Problem List Patient Active Problem List   Diagnosis Date Noted  . Cerebral thrombosis with cerebral infarction 06/05/2017  . Syncope 06/04/2017  . Lacunar infarction 02/20/2014  . Small vessel disease, cerebrovascular 02/20/2014  . Abnormality of gait 02/20/2014  . HYPERCHOLESTEROLEMIA  IIA 08/25/2010  . CORONARY  ATHEROSCLEROSIS NATIVE CORONARY ARTERY 08/25/2010  . CHEST PAIN, PRECORDIAL 08/25/2010   Amie Cowens B. [facility], CCC-SLP Speech Language Pathologist  Shonna Chock 09/21/2017, 4:29 PM  [place] MAIN [facility] SERVICES [address] [place], [place], [address] Phone: [phone]   Fax:  [phone]   Name: Tracey Hartman MRN: [id number] Date of Birth: 1930-05-02

## 2017-09-21 NOTE — Therapy (Signed)
[place] MAIN [facility] SERVICES [address] [place], [place], [address] Phone: [phone]   Fax:  [phone]  Physical Therapy Treatment  Patient Details  Name: Tracey Hartman MRN: [id number] Date of Birth: 1930-04-19 Referring Provider: Rosalin Hawking,   Encounter Date: 09/21/2017  PT End of Session - 09/21/17 1519    Visit Number  16    Number of Visits  33    Date for PT Re-Evaluation  10/18/17    PT Start Time  1506    PT Stop Time  1547    PT Time Calculation (min)  41 min    Equipment Utilized During Treatment  Gait belt    Activity Tolerance  Patient tolerated treatment well;No increased pain;Patient limited by fatigue    Behavior During Therapy  [facility] for tasks assessed/performed       Past Medical History:  Diagnosis Date  . Coronary artery disease   . Hypertension   . Myocardial infarction ([place]) 1986  . Osteoporosis   . Recurrent UTI   . Stroke [facility])     Past Surgical History:  Procedure Laterality Date  . APPENDECTOMY    . CATARACT EXTRACTION Bilateral   . COLONOSCOPY    . IR ANGIO INTRA EXTRACRAN SEL COM CAROTID INNOMINATE BILAT MOD SED  05/13/2017  . IR ANGIO VERTEBRAL SEL SUBCLAVIAN INNOMINATE UNI L MOD SED  06/16/2017  . IR ANGIO VERTEBRAL SEL SUBCLAVIAN INNOMINATE UNI R MOD SED  05/13/2017  . IR ANGIO VERTEBRAL SEL VERTEBRAL UNI L MOD SED  05/13/2017  . IR RADIOLOGIST EVAL & MGMT  06/08/2017  . RADIOLOGY WITH ANESTHESIA N/A 06/16/2017   Procedure: ANGIOPLASTY WITH STENTING;  Surgeon: Luanne Bras, MD;  Location: [place];  Service: Radiology;  Laterality: N/A;    There were no vitals filed for this visit.  Subjective Assessment - 09/21/17 1518    Subjective  patient reports that she is beginning to get back up to speed.     Pertinent History  Patinet lives alone and uses a RW if she needs it. Her children are staying with her after her recent fall. She has lived on her own up until recently when she had a fall  and hit her head needing stiches. She has 3 sons and they are rotating staying with her for safety concerns with her balance     Patient Stated Goals  Patient wants to be able to walk better.     Currently in Pain?  No/denies    Pain Score  0-No pain         Therapeutic Exercise:   Eccentric step downs x 10 BLE; cues to not put too much weight bearing on UE's  Squats x 10 with 5 sec hold, cues to maintain erect position  Heel raises x 10 x 2 ; cues to not rock forward  Resisted side-steeping RTB 4 lengths x 2;; cues to not rotate trunk towards direction of mobility  Sit to stand without UE support 2 x 10; cues for technique and posture correction  Step-ups to 6" step x 10 bilateral; cues for getting entire foot on step  Quantum leg press 100 # x 20 x 3; cues for not snapping LE in extension and performing slowly  NMR:  Side stepping on blue balance foam beam x 5 lengths with UE support and cues for trying to not use her hands  Tapping from floor to 6 inch stool with cues to not use her UE  if possible  SLS with 1 rail assist 10 sec hold x2 bilaterally;                       PT Education - 09/21/17 1518    Education provided  Yes    Education Details  exercise technique    Person(s) Educated  Patient    Methods  Explanation;Demonstration    Comprehension  Verbalized understanding;Returned demonstration       PT Short Term Goals - 08/09/17 1601      PT SHORT TERM GOAL #1   Title  Patient will be independent in home exercise program to improve strength/mobility for better functional independence with ADLs    Time  4    Period  Weeks    Status  On-going      PT SHORT TERM GOAL #2   Title  Patient (84 years old) will complete five times sit to stand test in < 15 seconds indicating an increased LE strength and improved balance.    Baseline  30.2 sec    Time  4    Period  Weeks    Status  On-going    Target Date  07/28/17        PT Long Term  Goals - 09/21/17 1538      PT LONG TERM GOAL #1   Title  Patient will reduce timed up and go to <11 seconds to reduce fall risk and demonstrate improved transfer/gait ability.    Baseline  19.2 sec; 08/09/17 16.86 sec 09/21/17 15.75 sec    Time  8    Period  Weeks    Status  Partially Met    Target Date  10/18/17      PT LONG TERM GOAL #2   Title  Patient (> 82 years old) will complete five times sit to stand test in < 15 seconds indicating an increased LE strength and improved balance.    Baseline  30.2 sec; 08/09/17 24.87, 09/21/17  24.02 sec    Time  8    Period  Weeks    Status  Partially Met    Target Date  10/18/17      PT LONG TERM GOAL #3   Title  Patient will tolerate 5 seconds of single leg stance without loss of balance to improve ability to get in and out of shower safely.    Baseline  2 sec BLE    Time  8    Period  Weeks    Status  On-going    Target Date  10/18/17      PT LONG TERM GOAL #4   Title  Patient will increase BLE gross strength to 4+/5 as to improve functional strength for independent gait, increased standing tolerance and increased ADL ability.    Baseline  4/5 BLE hips except hip add and hip ext, ankle 3/5 BLE PF; except left add 3/5    Time  8    Period  Weeks    Status  On-going    Target Date  10/18/17            Plan - 09/21/17 1520    Clinical Impression Statement  Patient presents with decreased gait speed, decreased balance, and decreased BLE strength. Patient's main complaint is BLE weakness and inability to participate in desired activities. Patient wants to improve his balance and ability to ambulate on inclines and outdoor surfaces safely. Patient will benefit from skilled PT in order  to increase gait speed, increase BLE strength, and improve dynamic standing balance to decrease risk for falls and enable patient to participate in desired activities.    Rehab Potential  Good    PT Frequency  2x / week    PT Duration  8 weeks    PT  Treatment/Interventions  Patient/family education;Therapeutic exercise;Therapeutic activities;Functional mobility training;Balance training;Neuromuscular re-education    PT Next Visit Plan  balance training and hip and ankle strengthening    PT Home Exercise Plan  heel raises, hip abd, hip ext with YTB    Consulted and Agree with Plan of Care  Patient;Family member/caregiver       Patient will benefit from skilled therapeutic intervention in order to improve the following deficits and impairments:  Abnormal gait, Decreased balance, Decreased endurance, Decreased mobility, Difficulty walking, Decreased strength  Visit Diagnosis: Abnormality of gait  Muscle weakness (generalized)  Difficulty in walking, not elsewhere classified  Cognitive communication deficit     Problem List Patient Active Problem List   Diagnosis Date Noted  . Cerebral thrombosis with cerebral infarction 06/05/2017  . Syncope 06/04/2017  . Lacunar infarction 02/20/2014  . Small vessel disease, cerebrovascular 02/20/2014  . Abnormality of gait 02/20/2014  . HYPERCHOLESTEROLEMIA  IIA 08/25/2010  . CORONARY ATHEROSCLEROSIS NATIVE CORONARY ARTERY 08/25/2010  . CHEST PAIN, PRECORDIAL 08/25/2010    Alanson Puls, PT DPT 09/21/2017, 3:39 PM  [place] MAIN [facility] SERVICES [address] [place], [place], [address] Phone: [phone]   Fax:  [phone]  Name: DUSTI TETRO MRN: [id number] Date of Birth: August 27, 1929

## 2017-09-23 ENCOUNTER — Ambulatory Visit: Payer: Medicare Other | Admitting: Speech Pathology

## 2017-09-23 ENCOUNTER — Encounter: Payer: Self-pay | Admitting: Physical Therapy

## 2017-09-23 ENCOUNTER — Ambulatory Visit: Payer: Medicare Other | Admitting: Physical Therapy

## 2017-09-23 DIAGNOSIS — R269 Unspecified abnormalities of gait and mobility: Secondary | ICD-10-CM

## 2017-09-23 DIAGNOSIS — M6281 Muscle weakness (generalized): Secondary | ICD-10-CM

## 2017-09-23 DIAGNOSIS — R41841 Cognitive communication deficit: Secondary | ICD-10-CM

## 2017-09-23 DIAGNOSIS — R262 Difficulty in walking, not elsewhere classified: Secondary | ICD-10-CM | POA: Diagnosis not present

## 2017-09-23 NOTE — Therapy (Signed)
[place] MAIN [facility] SERVICES [address] [place], [place], [address] Phone: [phone]   Fax:  [phone]  Physical Therapy Treatment  Patient Details  Name: Tracey Hartman MRN: [id number] Date of Birth: 1930/01/05 Referring Provider: Rosalin Hawking,   Encounter Date: 09/23/2017  PT End of Session - 09/23/17 1726    Visit Number  17    Number of Visits  33    Date for PT Re-Evaluation  10/18/17    PT Start Time  6979    PT Stop Time  1800    PT Time Calculation (min)  38 min    Equipment Utilized During Treatment  Gait belt    Activity Tolerance  Patient tolerated treatment well;No increased pain;Patient limited by fatigue    Behavior During Therapy  [facility] for tasks assessed/performed       Past Medical History:  Diagnosis Date  . Coronary artery disease   . Hypertension   . Myocardial infarction ([place]) 1986  . Osteoporosis   . Recurrent UTI   . Stroke [facility])     Past Surgical History:  Procedure Laterality Date  . APPENDECTOMY    . CATARACT EXTRACTION Bilateral   . COLONOSCOPY    . IR ANGIO INTRA EXTRACRAN SEL COM CAROTID INNOMINATE BILAT MOD SED  05/13/2017  . IR ANGIO VERTEBRAL SEL SUBCLAVIAN INNOMINATE UNI L MOD SED  06/16/2017  . IR ANGIO VERTEBRAL SEL SUBCLAVIAN INNOMINATE UNI R MOD SED  05/13/2017  . IR ANGIO VERTEBRAL SEL VERTEBRAL UNI L MOD SED  05/13/2017  . IR RADIOLOGIST EVAL & MGMT  06/08/2017  . RADIOLOGY WITH ANESTHESIA N/A 06/16/2017   Procedure: ANGIOPLASTY WITH STENTING;  Surgeon: Luanne Bras, MD;  Location: [place];  Service: Radiology;  Laterality: N/A;    There were no vitals filed for this visit.  Subjective Assessment - 09/23/17 1725    Subjective  patient reports that she is beginning to get back up to speed. She is doing her exercises each day.     Patient is accompained by:  Family member Son    Pertinent History  Patinet lives alone and uses a RW if she needs it. Her children are staying with her  after her recent fall. She has lived on her own up until recently when she had a fall and hit her head needing stiches. She has 3 sons and they are rotating staying with her for safety concerns with her balance     Patient Stated Goals  Patient wants to be able to walk better.     Currently in Pain?  No/denies    Pain Score  0-No pain    Pain Onset  Today    Multiple Pain Sites  No       TREATMENT Therapeutic exercise;  BOSU ball lunges x 20 x 2 Tapping on steps x 20  Side stepping with RTB x 4 lengths Squats x 10 x 2 Octane fitness x 5 minutes  Quantum double leg press  75 lbs x 15 x 3 Heel raises with UE support 2 x 10; Marching on table , supine x 20 x 2. , cues for technique   NMR: Standing on foam with head turns left and right x 20  Standing on foam and toe taps x 20   Min assist and CGA required for balance and mod verbal cues for correct posture and techniques during exercises and balance challenges.  PT Education - 09/23/17 1725    Education provided  Yes    Education Details  safety with standing balance     Person(s) Educated  Patient    Methods  Explanation;Demonstration    Comprehension  Verbalized understanding;Returned demonstration       PT Short Term Goals - 08/09/17 1601      PT SHORT TERM GOAL #1   Title  Patient will be independent in home exercise program to improve strength/mobility for better functional independence with ADLs    Time  4    Period  Weeks    Status  On-going      PT SHORT TERM GOAL #2   Title  Patient (< 3 years old) will complete five times sit to stand test in < 15 seconds indicating an increased LE strength and improved balance.    Baseline  30.2 sec    Time  4    Period  Weeks    Status  On-going    Target Date  07/28/17        PT Long Term Goals - 09/21/17 1538      PT LONG TERM GOAL #1   Title  Patient will reduce timed up and go to <11 seconds to reduce fall risk and  demonstrate improved transfer/gait ability.    Baseline  19.2 sec; 08/09/17 16.86 sec 09/21/17 15.75 sec    Time  8    Period  Weeks    Status  Partially Met    Target Date  10/18/17      PT LONG TERM GOAL #2   Title  Patient (> 57 years old) will complete five times sit to stand test in < 15 seconds indicating an increased LE strength and improved balance.    Baseline  30.2 sec; 08/09/17 24.87, 09/21/17  24.02 sec    Time  8    Period  Weeks    Status  Partially Met    Target Date  10/18/17      PT LONG TERM GOAL #3   Title  Patient will tolerate 5 seconds of single leg stance without loss of balance to improve ability to get in and out of shower safely.    Baseline  2 sec BLE    Time  8    Period  Weeks    Status  On-going    Target Date  10/18/17      PT LONG TERM GOAL #4   Title  Patient will increase BLE gross strength to 4+/5 as to improve functional strength for independent gait, increased standing tolerance and increased ADL ability.    Baseline  4/5 BLE hips except hip add and hip ext, ankle 3/5 BLE PF; except left add 3/5    Time  8    Period  Weeks    Status  On-going    Target Date  10/18/17            Plan - 09/23/17 1727    Clinical Impression Statement  Pt requires direction and verbal cues for correct performance of exercises. Patient demonstrates LOB with   strengthening exercises indicating decreased core strength. Pt was able to begin exercises today, noting poor endurance and poor core strength.  Pt was able to perform all exercises with min assist and VC for technique..   Patient struggles with speed during movement as well as balance with unstable surfaces. He Pt encouraged to continue HEP .Follow-up as scheduled.    Rehab Potential  Good    PT Frequency  2x / week    PT Duration  8 weeks    PT Treatment/Interventions  Patient/family education;Therapeutic exercise;Therapeutic activities;Functional mobility training;Balance training;Neuromuscular re-education     PT Next Visit Plan  balance training and hip and ankle strengthening    PT Home Exercise Plan  heel raises, hip abd, hip ext with YTB    Consulted and Agree with Plan of Care  Patient;Family member/caregiver       Patient will benefit from skilled therapeutic intervention in order to improve the following deficits and impairments:  Abnormal gait, Decreased balance, Decreased endurance, Decreased mobility, Difficulty walking, Decreased strength  Visit Diagnosis: Cognitive communication deficit  Abnormality of gait  Muscle weakness (generalized)  Difficulty in walking, not elsewhere classified     Problem List Patient Active Problem List   Diagnosis Date Noted  . Cerebral thrombosis with cerebral infarction 06/05/2017  . Syncope 06/04/2017  . Lacunar infarction 02/20/2014  . Small vessel disease, cerebrovascular 02/20/2014  . Abnormality of gait 02/20/2014  . HYPERCHOLESTEROLEMIA  IIA 08/25/2010  . CORONARY ATHEROSCLEROSIS NATIVE CORONARY ARTERY 08/25/2010  . CHEST PAIN, PRECORDIAL 08/25/2010    Alanson Puls, PT DPT 09/23/2017, 5:28 PM  [place] [facility] MAIN [facility] SERVICES [address] [place], [place], [address] Phone: [phone]   Fax:  [phone]  Name: LYRIS HITCHMAN MRN: [id number] Date of Birth: 06-03-1930

## 2017-09-24 ENCOUNTER — Encounter: Payer: Self-pay | Admitting: Speech Pathology

## 2017-09-24 NOTE — Therapy (Signed)
[place] MAIN [facility] SERVICES [place], [place], [address] Phone: [phone]   Fax:  [phone]  Speech Language Pathology Treatment  Patient Details  Name: Tracey Hartman MRN: [id number] Date of Birth: 09/07/1929 Referring Provider: Dr. Antony Contras   Encounter Date: 09/23/2017  End of Session - 09/24/17 0839    Visit Number  6    Number of Visits  17    Date for SLP Re-Evaluation  10/29/17    SLP Start Time  1600    SLP Stop Time   1650    SLP Time Calculation (min)  50 min    Activity Tolerance  Patient tolerated treatment well       Past Medical History:  Diagnosis Date  . Coronary artery disease   . Hypertension   . Myocardial infarction ([place]) 1986  . Osteoporosis   . Recurrent UTI   . Stroke [facility])     Past Surgical History:  Procedure Laterality Date  . APPENDECTOMY    . CATARACT EXTRACTION Bilateral   . COLONOSCOPY    . IR ANGIO INTRA EXTRACRAN SEL COM CAROTID INNOMINATE BILAT MOD SED  05/13/2017  . IR ANGIO VERTEBRAL SEL SUBCLAVIAN INNOMINATE UNI L MOD SED  06/16/2017  . IR ANGIO VERTEBRAL SEL SUBCLAVIAN INNOMINATE UNI R MOD SED  05/13/2017  . IR ANGIO VERTEBRAL SEL VERTEBRAL UNI L MOD SED  05/13/2017  . IR RADIOLOGIST EVAL & MGMT  06/08/2017  . RADIOLOGY WITH ANESTHESIA N/A 06/16/2017   Procedure: ANGIOPLASTY WITH STENTING;  Surgeon: Luanne Bras, MD;  Location: [place];  Service: Radiology;  Laterality: N/A;    There were no vitals filed for this visit.  Subjective Assessment - 09/24/17 0839    Subjective  "My eyes aren't keeping up"            ADULT SLP TREATMENT - 09/24/17 0001      General Information   Behavior/Cognition  Alert;Cooperative;Pleasant mood    HPI  84 year old female referred for outpatient skilled ST following multiple TIA's and confusion.      Treatment Provided   Treatment provided  Cognitive-Linquistic      Pain Assessment   Pain Assessment  No/denies pain       Cognitive-Linquistic Treatment   Treatment focused on  Cognition;Patient/family/caregiver education    Skilled Treatment  COGNITION: Patient brought her green folder with some started schedules/logs as requested last session.  LANGUAGE: Patient required multiple cues to follow simple written instructions including ongoing cues to continue.  Errors include incomplete response to instruction, loosing place in instruction, re-reading instructions, distraction.  Patient required mod cues to complete simple concrete reasoning tasks (constant characteristics and commonalities).  Able to write 2 items in a category given cues to follow directions.      Assessment / Recommendations / Plan   Plan  Continue with current plan of care       SLP Education - 09/24/17 [phone]    Education provided  Yes    Education Details  follow through with assigments    Person(s) Educated  Patient    Methods  Explanation    Comprehension  Verbalized understanding         SLP Long Term Goals - 09/21/17 1628      SLP LONG TERM GOAL #1   Title  Pt will demonstrate functional cognitive/communication skills for independent completion of personal responsibilities    Time  6    Period  Weeks  Status  On-going    Target Date  10/29/17      SLP LONG TERM GOAL #2   Title  Pt will complete complex cognitive tasks with 80% accuracy (including attention, reasoning, problem solving, thought organization)    Time  6    Period  Weeks    Status  On-going    Target Date  10/29/17      SLP LONG TERM GOAL #3   Title  Pt will demonstrate functional use of external memory aids with 80% accuracy within structured setting    Time  6    Period  Weeks    Status  On-going    Target Date  10/29/17       Plan - 09/24/17 0840    Clinical Impression Statement  Pt continues to exhibit cognitive deficits, including sustained and focused attention, accurate interpretation of instructions, and specificity of language.  Continued  ST intervention is recommended to maximize cognitive linguistic functioning for safe independent living and decreased caregiver burden.    Speech Therapy Frequency  2x / week    Treatment/Interventions  Cognitive reorganization;SLP instruction and feedback;Environmental controls;Internal/external aids;Compensatory strategies;Compensatory techniques;Patient/family education;Functional tasks;Multimodal communcation approach    Potential to Achieve Goals  Good    Potential Considerations  Ability to learn/carryover information;Severity of impairments;Cooperation/participation level;Family/community support;Previous level of function    SLP Home Exercise Plan  categorization task, bring binder and green folder next session, continue schedule/log    Consulted and Agree with Plan of Care  Patient       Patient will benefit from skilled therapeutic intervention in order to improve the following deficits and impairments:   Cognitive communication deficit    Problem List Patient Active Problem List   Diagnosis Date Noted  . Cerebral thrombosis with cerebral infarction 06/05/2017  . Syncope 06/04/2017  . Lacunar infarction 02/20/2014  . Small vessel disease, cerebrovascular 02/20/2014  . Abnormality of gait 02/20/2014  . HYPERCHOLESTEROLEMIA  IIA 08/25/2010  . CORONARY ATHEROSCLEROSIS NATIVE CORONARY ARTERY 08/25/2010  . CHEST PAIN, PRECORDIAL 08/25/2010   [name], MS/CCC- SLP  Lou Miner 09/24/2017, 8:42 AM  [place] MAIN [facility] SERVICES [address] [place], [place], [address] Phone: [phone]   Fax:  [phone]   Name: Tracey Hartman MRN: [id number] Date of Birth: 1929/08/31

## 2017-09-28 ENCOUNTER — Encounter: Payer: Self-pay | Admitting: Physical Therapy

## 2017-09-28 ENCOUNTER — Encounter: Payer: Self-pay | Admitting: Speech Pathology

## 2017-09-28 ENCOUNTER — Ambulatory Visit: Payer: Medicare Other | Admitting: Speech Pathology

## 2017-09-28 ENCOUNTER — Ambulatory Visit: Payer: Medicare Other | Admitting: Physical Therapy

## 2017-09-28 DIAGNOSIS — R41841 Cognitive communication deficit: Secondary | ICD-10-CM | POA: Diagnosis not present

## 2017-09-28 DIAGNOSIS — R262 Difficulty in walking, not elsewhere classified: Secondary | ICD-10-CM | POA: Diagnosis not present

## 2017-09-28 DIAGNOSIS — R269 Unspecified abnormalities of gait and mobility: Secondary | ICD-10-CM | POA: Diagnosis not present

## 2017-09-28 DIAGNOSIS — M6281 Muscle weakness (generalized): Secondary | ICD-10-CM | POA: Diagnosis not present

## 2017-09-28 NOTE — Therapy (Signed)
[place] MAIN [facility] SERVICES [address] [place], [place], [address] Phone: [phone]   Fax:  [phone]  Speech Language Pathology Treatment  Patient Details  Name: Tracey Hartman MRN: [id number] Date of Birth: July 09, 1930 Referring Provider: Dr. Antony Contras   Encounter Date: 09/28/2017  End of Session - 09/28/17 1631    Visit Number  7    Number of Visits  17    Date for SLP Re-Evaluation  10/29/17    SLP Start Time  17    SLP Stop Time   1500    SLP Time Calculation (min)  60 min    Activity Tolerance  Patient tolerated treatment well       Past Medical History:  Diagnosis Date  . Coronary artery disease   . Hypertension   . Myocardial infarction ([place]) 1986  . Osteoporosis   . Recurrent UTI   . Stroke [facility])     Past Surgical History:  Procedure Laterality Date  . APPENDECTOMY    . CATARACT EXTRACTION Bilateral   . COLONOSCOPY    . IR ANGIO INTRA EXTRACRAN SEL COM CAROTID INNOMINATE BILAT MOD SED  05/13/2017  . IR ANGIO VERTEBRAL SEL SUBCLAVIAN INNOMINATE UNI L MOD SED  06/16/2017  . IR ANGIO VERTEBRAL SEL SUBCLAVIAN INNOMINATE UNI R MOD SED  05/13/2017  . IR ANGIO VERTEBRAL SEL VERTEBRAL UNI L MOD SED  05/13/2017  . IR RADIOLOGIST EVAL & MGMT  06/08/2017  . RADIOLOGY WITH ANESTHESIA N/A 06/16/2017   Procedure: ANGIOPLASTY WITH STENTING;  Surgeon: Luanne Bras, MD;  Location: [place];  Service: Radiology;  Laterality: N/A;    There were no vitals filed for this visit.  Subjective Assessment - 09/28/17 1630    Subjective  I thought this would suffice (green folder). All I have in the notebook is my schedule"    Currently in Pain?  No/denies            ADULT SLP TREATMENT - 09/28/17 0001      General Information   Behavior/Cognition  Alert;Cooperative;Pleasant mood    HPI  84 year old female referred for outpatient skilled ST following multiple TIA's and confusion.      Treatment Provided   Treatment  provided  Cognitive-Linquistic      Pain Assessment   Pain Assessment  No/denies pain      Cognitive-Linquistic Treatment   Treatment focused on  Cognition;Patient/family/caregiver education    Skilled Treatment  COGNITION: Patient brought her green folder with some started schedules/logs as requested last session.  Folder was noted to be quite disorganized, however. SLP discussed purpose of journaling daily schedule to facilitate tasks in treatment sessions, and to establish routines for organization and increased independence.   Pt continues to verbalize that her main goal in treatment is to gain independence at home, yet her sons continue to do for her. Pt was encouraged to begin folding and putting away her own clothes, and to write out her schedule on a daily basis with some assist for attention and legibility, for carryover to treatment. SLP and pt discussed activities she is completing independently (making coffee, bathing, dressing, toileting), activities she completes with her sons (going through mail, filing paperwork, making MD appointments), and tasks her sons are managing for her (med admin, oven, meal prep, bills, retrieving mail, refilling meds, acquiring items from basement/attic).   LANGUAGE: Patient required max verbal and visual cues to follow simple written instructions including cues to redirect to task.  Pt is easily distracted, even in quiet environment. Patient required mod cues to complete category discrimination and reasoning task.       Assessment / Recommendations / Plan   Plan  Continue with current plan of care      Progression Toward Goals   Progression toward goals  Progressing toward goals       SLP Education - 09/28/17 1631    Education provided  Yes    Education Details  bring binder and folder, write in schedule each day, bring reading glasses    Person(s) Educated  Patient;Child(ren)    Methods  Explanation;Demonstration;Verbal cues;Handout     Comprehension  Verbalized understanding;Verbal cues required;Need further instruction         SLP Long Term Goals - 09/28/17 1632      SLP LONG TERM GOAL #1   Title  Pt will demonstrate functional cognitive/communication skills for independent completion of personal responsibilities    Time  5    Period  Weeks    Status  On-going    Target Date  10/29/17      SLP LONG TERM GOAL #2   Title  Pt will complete complex cognitive tasks with 80% accuracy (including attention, reasoning, problem solving, thought organization)    Time  5    Period  Weeks    Status  On-going    Target Date  10/29/17      SLP LONG TERM GOAL #3   Title  Pt will demonstrate functional use of external memory aids with 80% accuracy within structured setting    Time  5    Period  Weeks    Status  On-going    Target Date  10/29/17       Plan - 09/28/17 1448    Clinical Impression Statement  Pt continues to struggle with sustained attention, and is easily distracted by her own tangential speech. Frequent redirection is necessary to keep pt on task, even in quiet environment. 24 hour supervision continues to be recommended, given significance of cognitive deficits. Continued ST intervention is recommended to maximize cognitive linguistic functioning for safe independent living and decreased caregiver burden.    Speech Therapy Frequency  2x / week    Duration  -- 8 weeks    Treatment/Interventions  Cognitive reorganization;SLP instruction and feedback;Environmental controls;Internal/external aids;Compensatory strategies;Compensatory techniques;Patient/family education;Functional tasks;Multimodal communcation approach    Potential Considerations  Ability to learn/carryover information;Severity of impairments;Cooperation/participation level;Family/community support;Previous level of function    SLP Home Exercise Plan  categorization task, bring binder and green folder next session, continue schedule/log    Consulted  and Agree with Plan of Care  Patient;Family member/caregiver    Family Member Consulted  Son       Patient will benefit from skilled therapeutic intervention in order to improve the following deficits and impairments:   Cognitive communication deficit    Problem List Patient Active Problem List   Diagnosis Date Noted  . Cerebral thrombosis with cerebral infarction 06/05/2017  . Syncope 06/04/2017  . Lacunar infarction 02/20/2014  . Small vessel disease, cerebrovascular 02/20/2014  . Abnormality of gait 02/20/2014  . HYPERCHOLESTEROLEMIA  IIA 08/25/2010  . CORONARY ATHEROSCLEROSIS NATIVE CORONARY ARTERY 08/25/2010  . CHEST PAIN, PRECORDIAL 08/25/2010   Kenise Barraco B. Quentin Ore [facility], CCC-SLP Speech Language Pathologist  Shonna Chock 09/28/2017, 4:33 PM  [place] MAIN [facility] SERVICES [address] [place], [place], [address] Phone: [phone]   Fax:  [phone]   Name: Tracey  LIONA Hartman MRN: [id number] Date of Birth: [birth year]

## 2017-09-28 NOTE — Therapy (Signed)
[place] MAIN [facility] SERVICES [address] [place], [place], [address] Phone: [phone]   Fax:  [phone]  Physical Therapy Treatment  Patient Details  Name: Tracey Hartman MRN: [id number] Date of Birth: 07-Feb-1930 Referring Provider: Rosalin Hawking,   Encounter Date: 09/28/2017  PT End of Session - 09/28/17 1536    Visit Number  18    Number of Visits  33    Date for PT Re-Evaluation  10/18/17    PT Start Time  1515    PT Stop Time  1600    PT Time Calculation (min)  45 min    Equipment Utilized During Treatment  Gait belt    Activity Tolerance  Patient tolerated treatment well;No increased pain;Patient limited by fatigue    Behavior During Therapy  [facility] for tasks assessed/performed       Past Medical History:  Diagnosis Date  . Coronary artery disease   . Hypertension   . Myocardial infarction ([place]) 1986  . Osteoporosis   . Recurrent UTI   . Stroke [facility])     Past Surgical History:  Procedure Laterality Date  . APPENDECTOMY    . CATARACT EXTRACTION Bilateral   . COLONOSCOPY    . IR ANGIO INTRA EXTRACRAN SEL COM CAROTID INNOMINATE BILAT MOD SED  05/13/2017  . IR ANGIO VERTEBRAL SEL SUBCLAVIAN INNOMINATE UNI L MOD SED  06/16/2017  . IR ANGIO VERTEBRAL SEL SUBCLAVIAN INNOMINATE UNI R MOD SED  05/13/2017  . IR ANGIO VERTEBRAL SEL VERTEBRAL UNI L MOD SED  05/13/2017  . IR RADIOLOGIST EVAL & MGMT  06/08/2017  . RADIOLOGY WITH ANESTHESIA N/A 06/16/2017   Procedure: ANGIOPLASTY WITH STENTING;  Surgeon: Luanne Bras, MD;  Location: [place];  Service: Radiology;  Laterality: N/A;    There were no vitals filed for this visit.  Subjective Assessment - 09/28/17 1535    Subjective  patient reports that she is beginning to get back up to speed. She is doing her exercises each day. She has lost her glasses.     Patient is accompained by:  Family member Son    Pertinent History  Patinet lives alone and uses a RW if she needs it. Her  children are staying with her after her recent fall. She has lived on her own up until recently when she had a fall and hit her head needing stiches. She has 3 sons and they are rotating staying with her for safety concerns with her balance     Patient Stated Goals  Patient wants to be able to walk better.     Currently in Pain?  No/denies    Pain Score  0-No pain    Pain Onset  Today       Therapeutic exercise and neuromuscular training: 1/2 foam flat side up and balance with feet apart and feet together,cues for weight over feet and not to sway  tandem standing on floor  , cues to not use her hands standing hip abd with YTB x 20  , cues not to flex fwd side stepping left and right in parallel bars 10 feet x 3, cues not to use her hands step ups from floor to 6 inch stool x 20 bilateral, cues to lift legs higher not to catch the stool marching in parallel bars x 20, cues for bigger steps and faster movements Tilt board fwd/bwd, side to side left and right, cues to weight shift Stepping over bolster left and right and fwd/bwd,  cues to take higher steps  Patient needs occasional verbal cueing to improve posture and cueing to correctly perform exercises slowly, holding at end of range to increase motor firing of desired muscle to encourage fatigue.                       PT Education - 09/28/17 1536    Education provided  Yes    Education Details  safetly with exercises    Person(s) Educated  Patient    Methods  Explanation;Demonstration    Comprehension  Verbalized understanding;Returned demonstration       PT Short Term Goals - 08/09/17 1601      PT SHORT TERM GOAL #1   Title  Patient will be independent in home exercise program to improve strength/mobility for better functional independence with ADLs    Time  4    Period  Weeks    Status  On-going      PT SHORT TERM GOAL #2   Title  Patient (< 45 years old) will complete five times sit to stand test in < 15  seconds indicating an increased LE strength and improved balance.    Baseline  30.2 sec    Time  4    Period  Weeks    Status  On-going    Target Date  07/28/17        PT Long Term Goals - 09/21/17 1538      PT LONG TERM GOAL #1   Title  Patient will reduce timed up and go to <11 seconds to reduce fall risk and demonstrate improved transfer/gait ability.    Baseline  19.2 sec; 08/09/17 16.86 sec 09/21/17 15.75 sec    Time  8    Period  Weeks    Status  Partially Met    Target Date  10/18/17      PT LONG TERM GOAL #2   Title  Patient (> 75 years old) will complete five times sit to stand test in < 15 seconds indicating an increased LE strength and improved balance.    Baseline  30.2 sec; 08/09/17 24.87, 09/21/17  24.02 sec    Time  8    Period  Weeks    Status  Partially Met    Target Date  10/18/17      PT LONG TERM GOAL #3   Title  Patient will tolerate 5 seconds of single leg stance without loss of balance to improve ability to get in and out of shower safely.    Baseline  2 sec BLE    Time  8    Period  Weeks    Status  On-going    Target Date  10/18/17      PT LONG TERM GOAL #4   Title  Patient will increase BLE gross strength to 4+/5 as to improve functional strength for independent gait, increased standing tolerance and increased ADL ability.    Baseline  4/5 BLE hips except hip add and hip ext, ankle 3/5 BLE PF; except left add 3/5    Time  8    Period  Weeks    Status  On-going    Target Date  10/18/17            Plan - 09/28/17 1537    Clinical Impression Statement  Pt requires direction and verbal cues for correct performance of  standing dynamic balance  exercises. Patient has fatigue with endurance  and difficulty with  weight shift.   Patient struggles with speed during movement as well as balance with unstable surfaces. Pt encouraged to continue HEP   Patient will benefit from continued skilled PT to improve mobility and safety.    Rehab Potential  Good     PT Frequency  2x / week    PT Duration  8 weeks    PT Treatment/Interventions  Patient/family education;Therapeutic exercise;Therapeutic activities;Functional mobility training;Balance training;Neuromuscular re-education    PT Next Visit Plan  balance training and hip and ankle strengthening    PT Home Exercise Plan  heel raises, hip abd, hip ext with YTB    Consulted and Agree with Plan of Care  Patient;Family member/caregiver       Patient will benefit from skilled therapeutic intervention in order to improve the following deficits and impairments:  Abnormal gait, Decreased balance, Decreased endurance, Decreased mobility, Difficulty walking, Decreased strength  Visit Diagnosis: Cognitive communication deficit  Abnormality of gait  Muscle weakness (generalized)  Difficulty in walking, not elsewhere classified     Problem List Patient Active Problem List   Diagnosis Date Noted  . Cerebral thrombosis with cerebral infarction 06/05/2017  . Syncope 06/04/2017  . Lacunar infarction 02/20/2014  . Small vessel disease, cerebrovascular 02/20/2014  . Abnormality of gait 02/20/2014  . HYPERCHOLESTEROLEMIA  IIA 08/25/2010  . CORONARY ATHEROSCLEROSIS NATIVE CORONARY ARTERY 08/25/2010  . CHEST PAIN, PRECORDIAL 08/25/2010    Alanson Puls, PT DPT 09/28/2017, 3:40 PM  [place] MAIN [facility] SERVICES [address] [place], [place], [address] Phone: [phone]   Fax:  [phone]  Name: Tracey Hartman MRN: [id number] Date of Birth: Oct 14, 1929

## 2017-09-29 ENCOUNTER — Ambulatory Visit: Payer: Medicare Other | Admitting: Physical Therapy

## 2017-09-30 ENCOUNTER — Ambulatory Visit: Payer: Medicare Other | Admitting: Speech Pathology

## 2017-09-30 ENCOUNTER — Encounter: Payer: Self-pay | Admitting: Speech Pathology

## 2017-09-30 DIAGNOSIS — R41841 Cognitive communication deficit: Secondary | ICD-10-CM | POA: Diagnosis not present

## 2017-09-30 DIAGNOSIS — R262 Difficulty in walking, not elsewhere classified: Secondary | ICD-10-CM | POA: Diagnosis not present

## 2017-09-30 DIAGNOSIS — M6281 Muscle weakness (generalized): Secondary | ICD-10-CM | POA: Diagnosis not present

## 2017-09-30 DIAGNOSIS — R269 Unspecified abnormalities of gait and mobility: Secondary | ICD-10-CM | POA: Diagnosis not present

## 2017-09-30 NOTE — Therapy (Signed)
[place] MAIN [facility] SERVICES [place], [place], [address] Phone: [phone]   Fax:  [phone]  Speech Language Pathology Treatment  Patient Details  Name: Tracey Hartman MRN: [id number] Date of Birth: 07/22/1930 Referring Provider: Dr. Antony Contras   Encounter Date: 09/30/2017  End of Session - 09/30/17 1444    Visit Number  8    Number of Visits  17    Date for SLP Re-Evaluation  10/29/17    SLP Start Time  35    SLP Stop Time   1500    SLP Time Calculation (min)  60 min    Activity Tolerance  Patient tolerated treatment well       Past Medical History:  Diagnosis Date  . Coronary artery disease   . Hypertension   . Myocardial infarction ([place]) 1986  . Osteoporosis   . Recurrent UTI   . Stroke [facility])     Past Surgical History:  Procedure Laterality Date  . APPENDECTOMY    . CATARACT EXTRACTION Bilateral   . COLONOSCOPY    . IR ANGIO INTRA EXTRACRAN SEL COM CAROTID INNOMINATE BILAT MOD SED  05/13/2017  . IR ANGIO VERTEBRAL SEL SUBCLAVIAN INNOMINATE UNI L MOD SED  06/16/2017  . IR ANGIO VERTEBRAL SEL SUBCLAVIAN INNOMINATE UNI R MOD SED  05/13/2017  . IR ANGIO VERTEBRAL SEL VERTEBRAL UNI L MOD SED  05/13/2017  . IR RADIOLOGIST EVAL & MGMT  06/08/2017  . RADIOLOGY WITH ANESTHESIA N/A 06/16/2017   Procedure: ANGIOPLASTY WITH STENTING;  Surgeon: Luanne Bras, MD;  Location: [place];  Service: Radiology;  Laterality: N/A;    There were no vitals filed for this visit.  Subjective Assessment - 09/30/17 1443    Subjective  Pt brought binder and folder today. Tearful at end of session due to significant difficulty with sustained attention task.    Currently in Pain?  No/denies            ADULT SLP TREATMENT - 09/30/17 0001      General Information   Behavior/Cognition  Alert;Cooperative;Pleasant mood    HPI  84 year old female referred for outpatient skilled ST following multiple TIA's and confusion.       Treatment Provided   Treatment provided  Cognitive-Linquistic      Pain Assessment   Pain Assessment  No/denies pain      Cognitive-Linquistic Treatment   Treatment focused on  Cognition;Patient/family/caregiver education    Skilled Treatment  COGNITION: Pt required max ongoing cues to remind her of the task at hand (circling the first letter of each word) and keep her focused on progressing word by word (not skipping words or writing on the paper)  LANGUAGE: Pt brought completed homework targeting concrete reasoning. (96% accuracy), category discrimination task.  Pt demonstrates slow progress toward established goals.     Assessment / Recommendations / Plan   Plan  Continue with current plan of care      Progression Toward Goals   Progression toward goals  Progressing toward goals       SLP Education - 09/30/17 1444    Education provided  Yes    Education Details  continue writing schedule, add more details. homework for attention and concrete reasoning    Person(s) Educated  Patient    Methods  Explanation;Demonstration;Handout;Verbal cues    Comprehension  Verbalized understanding;Verbal cues required;Need further instruction         SLP Long Term Goals - 09/30/17 1449  SLP LONG TERM GOAL #1   Title  Pt will demonstrate functional cognitive/communication skills for independent completion of personal responsibilities    Time  5    Period  Weeks    Status  On-going    Target Date  10/29/17      SLP LONG TERM GOAL #2   Title  Pt will complete complex cognitive tasks with 80% accuracy (including attention, reasoning, problem solving, thought organization)    Time  5    Period  Weeks    Status  On-going    Target Date  10/29/17      SLP LONG TERM GOAL #3   Title  Pt will demonstrate functional use of external memory aids with 80% accuracy within structured setting    Time  5    Period  Weeks    Status  On-going    Target Date  10/29/17       Plan -  09/30/17 1445    Clinical Impression Statement  Pt became tearful today following attention task that was very difficult for her. Continued skilled ST intervention is recommended to maximize cognitive linguistic functioning for safe independent living and decreased caregiver burden.    Speech Therapy Frequency  2x / week    Duration  -- 8 weeks    Treatment/Interventions  Cognitive reorganization;SLP instruction and feedback;Environmental controls;Internal/external aids;Compensatory strategies;Compensatory techniques;Patient/family education;Functional tasks;Multimodal communcation approach    Potential to Achieve Goals  Fair    Potential Considerations  Ability to learn/carryover information;Severity of impairments;Cooperation/participation level;Family/community support;Previous level of function    SLP Home Exercise Plan  categorization task, bring binder and green folder each session, continue schedule/log, adding more detail    Consulted and Agree with Plan of Care  Patient;Family member/caregiver    Family Member Consulted  Son       Patient will benefit from skilled therapeutic intervention in order to improve the following deficits and impairments:   Cognitive communication deficit    Problem List Patient Active Problem List   Diagnosis Date Noted  . Cerebral thrombosis with cerebral infarction 06/05/2017  . Syncope 06/04/2017  . Lacunar infarction 02/20/2014  . Small vessel disease, cerebrovascular 02/20/2014  . Abnormality of gait 02/20/2014  . HYPERCHOLESTEROLEMIA  IIA 08/25/2010  . CORONARY ATHEROSCLEROSIS NATIVE CORONARY ARTERY 08/25/2010  . CHEST PAIN, PRECORDIAL 08/25/2010   Natalia Wittmeyer B. Quentin Ore [facility], CCC-SLP Speech Language Pathologist  Shonna Chock 09/30/2017, 3:37 PM  [place] MAIN [facility] SERVICES [address] [place], [place], [address] Phone: [phone]   Fax:  [phone]   Name: Tracey Hartman MRN:  [id number] Date of Birth: Jan 22, 1930

## 2017-10-01 ENCOUNTER — Encounter: Payer: Medicare Other | Admitting: Speech Pathology

## 2017-10-05 DIAGNOSIS — M810 Age-related osteoporosis without current pathological fracture: Secondary | ICD-10-CM | POA: Diagnosis not present

## 2017-10-05 DIAGNOSIS — N183 Chronic kidney disease, stage 3 (moderate): Secondary | ICD-10-CM | POA: Diagnosis not present

## 2017-10-05 DIAGNOSIS — I10 Essential (primary) hypertension: Secondary | ICD-10-CM | POA: Diagnosis not present

## 2017-10-06 ENCOUNTER — Ambulatory Visit: Payer: Medicare Other | Attending: Family Medicine | Admitting: Speech Pathology

## 2017-10-06 DIAGNOSIS — R41841 Cognitive communication deficit: Secondary | ICD-10-CM | POA: Insufficient documentation

## 2017-10-07 ENCOUNTER — Encounter: Payer: Self-pay | Admitting: Speech Pathology

## 2017-10-07 NOTE — Therapy (Signed)
[place] MAIN [facility] SERVICES [place], [place], [address] Phone: [phone]   Fax:  [phone]  Speech Language Pathology Treatment  Patient Details  Name: Tracey Hartman MRN: [id number] Date of Birth: 11-Apr-1930 Referring Provider: Dr. Antony Contras   Encounter Date: 10/06/2017  End of Session - 10/07/17 1243    Visit Number  9    Number of Visits  17    Date for SLP Re-Evaluation  10/29/17    SLP Start Time  1500    SLP Stop Time   2409    SLP Time Calculation (min)  54 min    Activity Tolerance  Patient tolerated treatment well       Past Medical History:  Diagnosis Date  . Coronary artery disease   . Hypertension   . Myocardial infarction ([place]) 1986  . Osteoporosis   . Recurrent UTI   . Stroke [facility])     Past Surgical History:  Procedure Laterality Date  . APPENDECTOMY    . CATARACT EXTRACTION Bilateral   . COLONOSCOPY    . IR ANGIO INTRA EXTRACRAN SEL COM CAROTID INNOMINATE BILAT MOD SED  05/13/2017  . IR ANGIO VERTEBRAL SEL SUBCLAVIAN INNOMINATE UNI L MOD SED  06/16/2017  . IR ANGIO VERTEBRAL SEL SUBCLAVIAN INNOMINATE UNI R MOD SED  05/13/2017  . IR ANGIO VERTEBRAL SEL VERTEBRAL UNI L MOD SED  05/13/2017  . IR RADIOLOGIST EVAL & MGMT  06/08/2017  . RADIOLOGY WITH ANESTHESIA N/A 06/16/2017   Procedure: ANGIOPLASTY WITH STENTING;  Surgeon: Luanne Bras, MD;  Location: [place];  Service: Radiology;  Laterality: N/A;    There were no vitals filed for this visit.  Subjective Assessment - 10/07/17 1242    Subjective  Patient brought binder and completed homework            ADULT SLP TREATMENT - 10/07/17 0001      General Information   Behavior/Cognition  Alert;Cooperative;Pleasant mood    HPI  84 year old female referred for outpatient skilled ST following multiple TIA's and confusion.      Treatment Provided   Treatment provided  Cognitive-Linquistic      Pain Assessment   Pain Assessment   No/denies pain      Cognitive-Linquistic Treatment   Treatment focused on  Cognition;Patient/family/caregiver education    Skilled Treatment  COGNITION: Patient brought her binder with completed schedules/logs and homework.  LANGUAGE: Patient required fewer cues to follow simple written instructions including ongoing cues to continue.  Errors include incomplete response to instruction, loosing place in instruction, re-reading instructions, distraction.  Patient required mod cues to complete simple concrete reasoning tasks (constant characteristics and commonalities).  Her responses improved with SLP cues to focus attention to tasks.  Able to write 2 items in a category given cues to follow directions.  Patient was not able to complete a lengthier and more visually complex reading task.      Assessment / Recommendations / Plan   Plan  Continue with current plan of care      Progression Toward Goals   Progression toward goals  Progressing toward goals       SLP Education - 10/07/17 1243    Education provided  Yes    Education Details  continue with writing schedule/log    Person(s) Educated  Patient    Methods  Explanation    Comprehension  Verbalized understanding         SLP Long Term Goals -  09/30/17 1449      SLP LONG TERM GOAL #1   Title  Pt will demonstrate functional cognitive/communication skills for independent completion of personal responsibilities    Time  5    Period  Weeks    Status  On-going    Target Date  10/29/17      SLP LONG TERM GOAL #2   Title  Pt will complete complex cognitive tasks with 80% accuracy (including attention, reasoning, problem solving, thought organization)    Time  5    Period  Weeks    Status  On-going    Target Date  10/29/17      SLP LONG TERM GOAL #3   Title  Pt will demonstrate functional use of external memory aids with 80% accuracy within structured setting    Time  5    Period  Weeks    Status  On-going    Target Date   10/29/17       Plan - 10/07/17 1244    Clinical Impression Statement  Patient completed a simple reading exercise with less cueing required. Continued skilled ST intervention is recommended to maximize cognitive linguistic functioning for safe independent living and decreased caregiver burden.    Speech Therapy Frequency  2x / week    Treatment/Interventions  Cognitive reorganization;SLP instruction and feedback;Environmental controls;Internal/external aids;Compensatory strategies;Compensatory techniques;Patient/family education;Functional tasks;Multimodal communcation approach    Potential to Achieve Goals  Fair    Potential Considerations  Ability to learn/carryover information;Severity of impairments;Cooperation/participation level;Family/community support;Previous level of function    SLP Home Exercise Plan  categorization task, bring binder and green folder each session, continue schedule/log, adding more detail    Consulted and Agree with Plan of Care  Patient       Patient will benefit from skilled therapeutic intervention in order to improve the following deficits and impairments:   Cognitive communication deficit    Problem List Patient Active Problem List   Diagnosis Date Noted  . Cerebral thrombosis with cerebral infarction 06/05/2017  . Syncope 06/04/2017  . Lacunar infarction 02/20/2014  . Small vessel disease, cerebrovascular 02/20/2014  . Abnormality of gait 02/20/2014  . HYPERCHOLESTEROLEMIA  IIA 08/25/2010  . CORONARY ATHEROSCLEROSIS NATIVE CORONARY ARTERY 08/25/2010  . CHEST PAIN, PRECORDIAL 08/25/2010   [name], MS/CCC- SLP  Lou Miner 10/07/2017, 12:45 PM  [place] MAIN [facility] SERVICES [address] [place], [place], [address] Phone: [phone]   Fax:  [phone]   Name: Tracey Hartman MRN: [id number] Date of Birth: [birth year]

## 2017-10-12 ENCOUNTER — Ambulatory Visit: Payer: Medicare Other | Admitting: Speech Pathology

## 2017-10-12 ENCOUNTER — Ambulatory Visit: Payer: Medicare Other | Admitting: Physical Therapy

## 2017-10-14 ENCOUNTER — Ambulatory Visit: Payer: Medicare Other | Admitting: Speech Pathology

## 2017-10-14 ENCOUNTER — Ambulatory Visit: Payer: Medicare Other | Admitting: Physical Therapy

## 2017-10-19 ENCOUNTER — Ambulatory Visit: Payer: Medicare Other | Admitting: Physical Therapy

## 2017-10-19 ENCOUNTER — Ambulatory Visit: Payer: Medicare Other | Admitting: Speech Pathology

## 2017-10-20 DIAGNOSIS — I10 Essential (primary) hypertension: Secondary | ICD-10-CM | POA: Diagnosis not present

## 2017-10-20 DIAGNOSIS — G459 Transient cerebral ischemic attack, unspecified: Secondary | ICD-10-CM | POA: Diagnosis not present

## 2017-10-21 ENCOUNTER — Ambulatory Visit: Payer: Medicare Other | Admitting: Speech Pathology

## 2017-10-26 ENCOUNTER — Ambulatory Visit: Payer: Medicare Other | Admitting: Speech Pathology

## 2017-10-26 ENCOUNTER — Ambulatory Visit: Payer: Medicare Other | Admitting: Physical Therapy

## 2017-10-28 ENCOUNTER — Ambulatory Visit: Payer: Medicare Other | Admitting: Speech Pathology

## 2017-10-28 ENCOUNTER — Ambulatory Visit: Payer: Medicare Other | Admitting: Physical Therapy

## 2017-11-02 ENCOUNTER — Ambulatory Visit: Payer: Medicare Other | Admitting: Speech Pathology

## 2017-11-02 ENCOUNTER — Other Ambulatory Visit: Payer: Self-pay | Admitting: Adult Health

## 2017-11-02 DIAGNOSIS — I6381 Other cerebral infarction due to occlusion or stenosis of small artery: Secondary | ICD-10-CM

## 2017-11-03 ENCOUNTER — Ambulatory Visit (INDEPENDENT_AMBULATORY_CARE_PROVIDER_SITE_OTHER): Payer: Medicare Other | Admitting: Adult Health

## 2017-11-03 ENCOUNTER — Telehealth: Payer: Self-pay | Admitting: Adult Health

## 2017-11-03 ENCOUNTER — Encounter: Payer: Self-pay | Admitting: Adult Health

## 2017-11-03 VITALS — BP 151/86 | HR 70 | Ht 63.0 in | Wt 145.0 lb

## 2017-11-03 DIAGNOSIS — R413 Other amnesia: Secondary | ICD-10-CM | POA: Diagnosis not present

## 2017-11-03 DIAGNOSIS — R4701 Aphasia: Secondary | ICD-10-CM | POA: Diagnosis not present

## 2017-11-03 DIAGNOSIS — I6330 Cerebral infarction due to thrombosis of unspecified cerebral artery: Secondary | ICD-10-CM | POA: Diagnosis not present

## 2017-11-03 DIAGNOSIS — R29818 Other symptoms and signs involving the nervous system: Secondary | ICD-10-CM | POA: Diagnosis not present

## 2017-11-03 DIAGNOSIS — I6523 Occlusion and stenosis of bilateral carotid arteries: Secondary | ICD-10-CM | POA: Diagnosis not present

## 2017-11-03 MED ORDER — DONEPEZIL HCL 5 MG PO TABS: 5.0000 mg | ORAL_TABLET | Freq: Every day | ORAL | 3 refills | Status: DC

## 2017-11-03 NOTE — Telephone Encounter (Signed)
Medicare/CSI order sent to [facility] no auth they will contact the pt to schedule.

## 2017-11-03 NOTE — Progress Notes (Addendum)
[facility] [address] [place]. [place] [address] [phone]       OFFICE FOLLOW UP VISIT NOTE  Tracey. Tracey Hartman Date of Birth:  September 30, 1929 Medical Record Number:  [id number]   Reason for Referral:  Memory and balance difficulties  HPI: Initial consult 03/01/17 : Tracey Hartman is a pleasant 84 year old Caucasian lady who is accompanied today by her son and granddaughter. She has been having some memory and cognitive difficulties for several months. She attributes this due to significant stress that she is going through. She is in the process of selling her house and is currently building a house in [place] where she plans to move to be close to her family. 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 She had times feels she has trouble remembering recent information as well as at times finding words and completing sentences. Her son feels that her communication is not as crisp as it used to be and she has trouble reading words out and cannot think and sharply. Patient is still independent and manages all activities of daily living. She denies any significant headaches, head injury with loss of consciousness, seizures. She has a prior history of small stroke in July 2015 when she saw me for episode of gait ataxia and some mild right-sided weakness which lasted several days this event and MRI scan of the brain done on 01/14/2014 which I personally reviewed and shows moderate changes of small vessel disease and several tiny microhemorrhages on gradient echo images. Intracranial and extracranial vascular imaging has not been performed and echo has also not been done. Hemoglobin A1c was borderline at 6.3 and lipid profile showed LDL of 94 mg percent She is on aspirin daily which is tolerating well without side effects. She is also on pravastatin and is tolerating it well without muscle aches or pains. She also takes fish oil daily. She's had some  mild balance difficulties for long time but feels awful 8 when she makes a sudden turn or tries to walk quickly she is insecure. She is a had a couple of minor falls but has not had fortunately any major injuries. She denies any tingling numbness burning or pain in her feet. She denies feeling depressed but does admit to some anxiety due to her move. There is no history of episodes of confusion, disorientation. She still driving and has never gotten lost. There is no history of delusions, hallucinations or agitation or abnormal behavior Update 04/28/2017 ; she returns for follow-up after last visit 2 months ago. She is accompanied by her son and granddaughter. Patient states she is doing well she's had no stroke or TIA symptoms. She continues to have dizziness and imbalance. She is careful if she gets gets up slowly but if she makes sudden movements she is more off-balance. She is at no falls or injuries. She did undergo MRI scan of the brain on 03/11/17 which I personally reviewed shows 2 subacute small white matter infarcts on either side which appear to be lacunar in nature. There are also remote age lacunar infarcts. CT angiogram of the brain and neck was performed on 04/20/17 which shows severely calcified bulky plaque extends stenosis involving the right brachiocephalic and left subclavian arteries. There was a tiny 2 mm posterior right internal carotid artery terminus aneurysm or infundibulum. I personally reviewed the imaging films and discuss with the family. Patient denies any symptoms of vertebrobasilar steal in the form of dizziness  and lightheadedness with arms raised about the shoulder. All memory panel labs done on 03/01/17 were normal. Update 07/21/2017 : She returns for follow-up after recent hospital admission on 06/04/2017 with episode of loss of consciousness and fall. She is accompanied by her granddaughter and son who provide history. I have personally reviewed electronic medical records and  imaging films. She was emergently admitted for consideration for subclavian steal syndrome given the fact that cerebral catheter angiogram in October 2018 had shown 85% proximal left subclavian stenosis. Patient did complain of confusion and vertical diplopia on admission. She had some amnesia after her fall. MRI scan of the brain done on 06/04/17 showed small right cerebral peduncle and right thalamus punctate lacunar infarct. She underwent cerebral catheter angiogram on 06/06/17 by Dr. Estanislado Pandy but it showed only 65% proximal left subclavian stenosis without clear evidence of subclavian steal. Hence it was decided to treat her medically and subclavian stenting was not performed. Patient was seen by physical occupational therapy and has been getting therapy. She is doing better now she can walk short distances independently. Her blood pressure has been pretty good she is brought with her today her blood pressure log mostly transcend 110 to 130s. Today it is slightly elevated in my office at 157/93. She was changed from Lipitor to Pravachol as she had some side effects. She continues to have mild short-term memory difficulties but these have not progressive. She does not parts. In cognitively challenging activities. At last office visit in September she had scored 29/30 on the Mini-Mental status exam.  Update 11/03/17: Patient is being seen today for follow-up and is accompanied by her son.  Son states she was doing well until 10/09/2017 when she went to bed normal but woke up "not acting the same".  She was unable to control her bladder, she was struggling to get the right words out and had an increase in fatigue.  Denies balance issues, increased weakness on one side, or increasing numbness and tingling.  The son states that her blood pressure was in the 350K systolically around that time and was recently started on lisinopril by PCP.  That morning her blood pressure was in the lower 100s and he feels as though her  blood pressure was not adequate enough to perfuse as she does have a history of stenosis.  Patient was not seen or had any imaging done since this time.  She is currently taking aspirin 81 mg without increase in bleeding or bruising.  Per Dr. Lenell Antu office note at last visit, he recommended continuing aspirin and Plavix for 3 months and then continue Plavix alone.  Son was not aware of this recommendation.  Continues take pravastatin without increase in myalgias.  Family concern for recent worsening of memory as well.  MMSE performed today and scored a 14/30 (previous MMSE 01/2017 29/30).  Patient was undergoing outpatient PT/SLP but was recommended by outpatient therapist for her to receive home PT/SLP.  I agree with this recommendation at this time but also would recommend adding OT.  Patient would gain great benefit from having therapies in her own home.  She also continues to have problems with her speech where she is unable to get the words out but knows what she wants to say.  Denies any issue with understanding what people are trying to say to her.  Also endorses some signs and symptoms of depression but refuses to have medication treatment for it at this time as she would like to wait and  see if this gets better.  Highly recommend at this time to obtain MRI brain to assess for possible stroke.  As patient does have history of 65% carotid stenosis, will also be beneficial to asses obtain MRA of head and neck.   ROS:   14 system review of systems is positive for memory loss, speech difficulty, and weakness and all other systems negative  PMH:  Past Medical History:  Diagnosis Date  . Coronary artery disease   . Hypertension   . Myocardial infarction ([place]) 1986  . Osteoporosis   . Recurrent UTI   . Stroke [facility])     Social History:  Social History   Socioeconomic History  . Marital status: Married    Spouse name: Not on file  . Number of children: 4  . Years of education: 12th  . Highest  education level: Not on file  Occupational History  . Occupation: Therapist, music: [employer]  Social Needs  . Financial resource strain: Not on file  . Food insecurity:    Worry: Not on file    Inability: Not on file  . Transportation needs:    Medical: Not on file    Non-medical: Not on file  Tobacco Use  . Smoking status: Former Research scientist (life sciences)  . Smokeless tobacco: Never Used  Substance and Sexual Activity  . Alcohol use: No  . Drug use: No  . Sexual activity: Never  Lifestyle  . Physical activity:    Days per week: Not on file    Minutes per session: Not on file  . Stress: Not on file  Relationships  . Social connections:    Talks on phone: Not on file    Gets together: Not on file    Attends religious service: Not on file    Active member of club or organization: Not on file    Attends meetings of clubs or organizations: Not on file    Relationship status: Not on file  . Intimate partner violence:    Fear of current or ex partner: Not on file    Emotionally abused: Not on file    Physically abused: Not on file    Forced sexual activity: Not on file  Other Topics Concern  . Not on file  Social History Narrative   Patient lives at home alone    Patient is right handed   Patient drinks coffee daily    Medications:   Current Outpatient Medications on File Prior to Visit  Medication Sig Dispense Refill  . aspirin EC 81 MG tablet Take 1 tablet (81 mg total) by mouth daily.    . Biotin 10 MG TABS Take 1 tablet by mouth daily.    . Calcium Carbonate-Vitamin D 600-400 MG-UNIT tablet Take 2 tablets by mouth daily with breakfast.     . clopidogrel (PLAVIX) 75 MG tablet Take 1 tablet (75 mg total) by mouth daily. 30 tablet 11  . Cranberry 1000 MG CAPS Take 1,000 mg by mouth daily.    Marland Kitchen GARLIC PO Take 1 capsule by mouth daily.    . metoprolol tartrate (LOPRESSOR) 50 MG tablet Take 25 mg daily by mouth.    . Multiple Vitamins-Minerals (MULTIVITAMIN WITH MINERALS)  tablet Take 1 tablet by mouth daily.    . Omega-3 Fatty Acids (FISH OIL) 1200 MG CAPS Take 2 capsules by mouth daily.     . pravastatin (PRAVACHOL) 80 MG tablet Take 80 mg by mouth daily.    Marland Kitchen  trimethoprim (TRIMPEX) 100 MG tablet Take 100 mg by mouth daily.      No current facility-administered medications on file prior to visit.     Allergies:   Allergies  Allergen Reactions  . Codeine     UNSPECIFIED REACTION     Physical Exam General: well developed, well nourished, pleasant elderly Caucasian lady seated, in no evident distress Head: head normocephalic and atraumatic.   Neck: supple with no carotid or supraclavicular bruits Cardiovascular: regular rate and rhythm, no murmurs Musculoskeletal: no deformity Skin:  no rash/petichiae Vascular:  Normal pulses all extremities.Symmetric bilateral radial pulses at rest but Adson's test is positive on the left.  Neurologic Exam Mental Status: Awake and fully alert.  Attention span, concentration and fund of knowledge appropriate. Mood and affect appropriate.  Expressive aphasia present MMSE - Mini Mental State Exam 11/03/2017 03/01/2017  Orientation to time 1 5  Orientation to Place 3 5  Registration 3 3  Attention/ Calculation 0 5  Recall 0 2  Language- name 2 objects 2 2  Language- repeat 1 1  Language- follow 3 step command 3 3  Language- read & follow direction 1 1  Write a sentence 0 1  Copy design 0 1  Total score 14 29   Cranial Nerves: Fundoscopic exam reveals sharp disc margins. Pupils equal, briskly reactive to light. Extraocular movements full without nystagmus. Visual fields full to confrontation. Hearing intact. Facial sensation intact. Face, tongue, palate moves normally and symmetrically.  Motor: Normal bulk and tone. Normal strength in all tested extremity muscles.  Mild weakness in RUE Sensory.: intact to touch , pinprick , position and vibratory sensation.  Coordination: Rapid alternating movements normal in all  extremities. Finger-to-nose and heel-to-shin performed accurately bilaterally.  Left hand orbits over right hand. Gait and Station: Arises from chair without difficulty. Stance is slightly broad-based. Gait demonstrates normal stride length and mild imbalance .   Reflexes: 1+ and symmetric. Toes downgoing.     ASSESSMENT: 64 year Caucasian lady with mild memory and cognitive difficulties likely from mild cognitive impairment. Mild gait and balance difficulties likely multifactorial secondary to age-related small vessel disease and prior strokes . Recurrent small right brainstem and thalamic infarcts in November 2018 likely from small vessel disease. Cerebral angiogram shows moderate proximal left subclavian stenosis but clinical exam and history do not support subclavian steal.  Patient returns today for increased symptoms of memory loss, aphasia, and right-sided weakness.  PLAN: -Stop aspirin 81mg  and take plavix 75mg  daily only -Continue pravastatin 40mg  daily -Start aricept 5mg  daily for increase in memory loss- notify us if you have any stomach upset or diarrhea -We will call you to schedule MRI and MRA for possible new stroke or occlusion -Home PT/OT/SLP -Consider starting antidepressant medication at next visit -Maintain strict control of hypertension with blood pressure goal below 130/90, diabetes with hemoglobin A1c goal below 6.5% and cholesterol with LDL cholesterol (bad cholesterol) goal below 70 mg/dL. I also advised the patient to eat a healthy diet with plenty of whole grains, cereals, fruits and vegetables, exercise regularly and maintain ideal body weight.  Followup in the future with me in 2 months  Greater than 50% time during this 25 minute consultation visit was spent on counseling and coordination of care about HLD and HTN (risk factors), discussion about risk benefit of anticoagulation and answering question  Venancio Poisson, [facility]  [facility] [address] [place], [place] [address]  Phone [phone] Fax [phone]  ADDENDUM: Son informed office that patient has been in fact on both aspirin 81mg  and plavix 75mg . As there is an increased risk of hemorrhage being on both medications and with her recent neuro decline, order placed for STAT CT head. Son aware. If no hemorrhage, we will continue with previous plan of obtaining MRI and MRA head/neck and continue on Plavix only.

## 2017-11-03 NOTE — Telephone Encounter (Signed)
Can you see Patient this week. Patient needs to be seen for Home Health . Patient has not been seen since Dec. She needs a recent office visit for insurance reason . Please advise Tracey Hartman.

## 2017-11-03 NOTE — Patient Instructions (Signed)
Stop aspirin 81mg  and take plavix 75mg  daily only  Continue pravastatin 40mg  daily  Start aricept 5mg  daily - notify us if you have any stomach upset or diarrhea  We will call you to schedule MRI and MRA  Home PT/OT/SLP  Maintain strict control of hypertension with blood pressure goal below 130/90, diabetes with hemoglobin A1c goal below 6.5% and cholesterol with LDL cholesterol (bad cholesterol) goal below 70 mg/dL. I also advised the patient to eat a healthy diet with plenty of whole grains, cereals, fruits and vegetables, exercise regularly and maintain ideal body weight.  Followup in the future with me in 2 months

## 2017-11-04 ENCOUNTER — Encounter: Payer: Medicare Other | Admitting: Speech Pathology

## 2017-11-04 ENCOUNTER — Ambulatory Visit
Admission: RE | Admit: 2017-11-04 | Discharge: 2017-11-04 | Disposition: A | Discharge status: Home / Self Care | Payer: Medicare Other | Source: Ambulatory Visit | Attending: Adult Health | Admitting: Adult Health

## 2017-11-04 ENCOUNTER — Telehealth: Payer: Self-pay | Admitting: Adult Health

## 2017-11-04 DIAGNOSIS — R413 Other amnesia: Secondary | ICD-10-CM

## 2017-11-04 DIAGNOSIS — I6330 Cerebral infarction due to thrombosis of unspecified cerebral artery: Secondary | ICD-10-CM

## 2017-11-04 DIAGNOSIS — R4701 Aphasia: Secondary | ICD-10-CM

## 2017-11-04 DIAGNOSIS — R29818 Other symptoms and signs involving the nervous system: Secondary | ICD-10-CM

## 2017-11-04 DIAGNOSIS — R51 Headache: Secondary | ICD-10-CM | POA: Diagnosis not present

## 2017-11-04 NOTE — Addendum Note (Signed)
Addended by: Venancio Poisson on: 11/04/2017 11:38 AM   Modules accepted: Orders

## 2017-11-04 NOTE — Telephone Encounter (Signed)
Called patients son, Legrand Como, in regards to the recent CT scan results which did not show any acute intracranial pathology. I requested that he continue to give patient Plavix but to stop the aspirin. He verbalized understanding and appreciated the call.

## 2017-11-04 NOTE — Telephone Encounter (Signed)
Just an FYI I spoke to [place] at [place] and she stated the patient can walk in at the [facility] location. I spoke to [place] patients son and he is aware to take her there to get it done.

## 2017-11-05 DIAGNOSIS — I69351 Hemiplegia and hemiparesis following cerebral infarction affecting right dominant side: Secondary | ICD-10-CM | POA: Diagnosis not present

## 2017-11-05 DIAGNOSIS — R300 Dysuria: Secondary | ICD-10-CM | POA: Diagnosis not present

## 2017-11-05 DIAGNOSIS — I69320 Aphasia following cerebral infarction: Secondary | ICD-10-CM | POA: Diagnosis not present

## 2017-11-06 DIAGNOSIS — I69320 Aphasia following cerebral infarction: Secondary | ICD-10-CM | POA: Diagnosis not present

## 2017-11-06 DIAGNOSIS — I69351 Hemiplegia and hemiparesis following cerebral infarction affecting right dominant side: Secondary | ICD-10-CM | POA: Diagnosis not present

## 2017-11-07 NOTE — Progress Notes (Signed)
I agree with the above plan 

## 2017-11-08 DIAGNOSIS — F329 Major depressive disorder, single episode, unspecified: Secondary | ICD-10-CM | POA: Diagnosis not present

## 2017-11-08 DIAGNOSIS — R1319 Other dysphagia: Secondary | ICD-10-CM | POA: Diagnosis not present

## 2017-11-08 DIAGNOSIS — F0390 Unspecified dementia without behavioral disturbance: Secondary | ICD-10-CM | POA: Diagnosis not present

## 2017-11-08 DIAGNOSIS — I10 Essential (primary) hypertension: Secondary | ICD-10-CM | POA: Diagnosis not present

## 2017-11-08 DIAGNOSIS — R300 Dysuria: Secondary | ICD-10-CM | POA: Diagnosis not present

## 2017-11-09 ENCOUNTER — Telehealth: Payer: Self-pay | Admitting: Adult Health

## 2017-11-09 ENCOUNTER — Ambulatory Visit: Payer: Medicare Other

## 2017-11-09 ENCOUNTER — Encounter: Payer: Medicare Other | Admitting: Speech Pathology

## 2017-11-09 DIAGNOSIS — I69351 Hemiplegia and hemiparesis following cerebral infarction affecting right dominant side: Secondary | ICD-10-CM | POA: Diagnosis not present

## 2017-11-09 DIAGNOSIS — I69320 Aphasia following cerebral infarction: Secondary | ICD-10-CM | POA: Diagnosis not present

## 2017-11-09 NOTE — Telephone Encounter (Signed)
Rn call Eustace Pen to give verbal order per Janett Billow Np for balancing and walking. Rn gave the order for balancing,and walking. Terra verbalized understanding.

## 2017-11-09 NOTE — Telephone Encounter (Signed)
LEft vm for terra to call back about orders.

## 2017-11-09 NOTE — Telephone Encounter (Signed)
Terra PT called stating she will see the pt twice a week for 4 weeks focusing on balance and walking. Eustace Pen can be reached at [phone]

## 2017-11-09 NOTE — Telephone Encounter (Signed)
Rn receive verbal orders from [place] NP that pt can have PT twice for 4 weeks for balancing, and walking.

## 2017-11-10 ENCOUNTER — Ambulatory Visit
Admission: RE | Admit: 2017-11-10 | Discharge: 2017-11-10 | Disposition: A | Discharge status: Home / Self Care | Payer: Medicare Other | Source: Ambulatory Visit | Attending: Adult Health | Admitting: Adult Health

## 2017-11-10 ENCOUNTER — Telehealth: Payer: Self-pay | Admitting: Adult Health

## 2017-11-10 DIAGNOSIS — R413 Other amnesia: Secondary | ICD-10-CM

## 2017-11-10 DIAGNOSIS — R4701 Aphasia: Secondary | ICD-10-CM

## 2017-11-10 DIAGNOSIS — I6523 Occlusion and stenosis of bilateral carotid arteries: Secondary | ICD-10-CM

## 2017-11-10 MED ORDER — GADOBENATE DIMEGLUMINE 529 MG/ML IV SOLN
13.0000 mL | Freq: Once | INTRAVENOUS | Status: AC | PRN
  Administered 2017-11-10: 13 mL via INTRAVENOUS

## 2017-11-10 NOTE — Telephone Encounter (Signed)
Received phone call from [facility] after Tracey Hartman had her MRI brain. Acute lacunar infarct present on scan. Notified Myriam Jacobson (granddaughter) and stated etiology was from small vessel disease. No further questions at this time. Granddaughter states patient has been stable.

## 2017-11-11 ENCOUNTER — Encounter: Payer: Medicare Other | Admitting: Speech Pathology

## 2017-11-11 DIAGNOSIS — I69320 Aphasia following cerebral infarction: Secondary | ICD-10-CM | POA: Diagnosis not present

## 2017-11-11 DIAGNOSIS — I69351 Hemiplegia and hemiparesis following cerebral infarction affecting right dominant side: Secondary | ICD-10-CM | POA: Diagnosis not present

## 2017-11-15 ENCOUNTER — Ambulatory Visit: Payer: Medicare Other | Admitting: Physical Therapy

## 2017-11-15 ENCOUNTER — Encounter: Payer: Medicare Other | Admitting: Speech Pathology

## 2017-11-16 DIAGNOSIS — I69320 Aphasia following cerebral infarction: Secondary | ICD-10-CM | POA: Diagnosis not present

## 2017-11-16 DIAGNOSIS — I69351 Hemiplegia and hemiparesis following cerebral infarction affecting right dominant side: Secondary | ICD-10-CM | POA: Diagnosis not present

## 2017-11-18 ENCOUNTER — Encounter: Payer: Medicare Other | Admitting: Speech Pathology

## 2017-11-18 ENCOUNTER — Ambulatory Visit: Payer: Medicare Other | Admitting: Physical Therapy

## 2017-11-18 DIAGNOSIS — R1310 Dysphagia, unspecified: Secondary | ICD-10-CM | POA: Diagnosis not present

## 2017-11-19 DIAGNOSIS — I69351 Hemiplegia and hemiparesis following cerebral infarction affecting right dominant side: Secondary | ICD-10-CM | POA: Diagnosis not present

## 2017-11-19 DIAGNOSIS — I69320 Aphasia following cerebral infarction: Secondary | ICD-10-CM | POA: Diagnosis not present

## 2017-11-20 DIAGNOSIS — I69320 Aphasia following cerebral infarction: Secondary | ICD-10-CM | POA: Diagnosis not present

## 2017-11-20 DIAGNOSIS — I69351 Hemiplegia and hemiparesis following cerebral infarction affecting right dominant side: Secondary | ICD-10-CM | POA: Diagnosis not present

## 2017-11-22 ENCOUNTER — Encounter: Payer: Medicare Other | Admitting: Speech Pathology

## 2017-11-22 ENCOUNTER — Ambulatory Visit: Payer: Medicare Other | Admitting: Physical Therapy

## 2017-11-24 ENCOUNTER — Ambulatory Visit: Payer: Medicare Other | Admitting: Physical Therapy

## 2017-11-24 ENCOUNTER — Encounter: Payer: Medicare Other | Admitting: Speech Pathology

## 2017-11-24 DIAGNOSIS — I69351 Hemiplegia and hemiparesis following cerebral infarction affecting right dominant side: Secondary | ICD-10-CM | POA: Diagnosis not present

## 2017-11-24 DIAGNOSIS — I69320 Aphasia following cerebral infarction: Secondary | ICD-10-CM | POA: Diagnosis not present

## 2017-11-24 NOTE — Telephone Encounter (Signed)
Ok per vo by Janett Billow to extend speech therapy.  Returned call to [employer] and provided the verbal orders.

## 2017-11-24 NOTE — Telephone Encounter (Signed)
Kim with [facility] is calling to extend orders for 2 more visits for speech therapy.

## 2017-11-25 DIAGNOSIS — Z1389 Encounter for screening for other disorder: Secondary | ICD-10-CM | POA: Diagnosis not present

## 2017-11-25 DIAGNOSIS — M2550 Pain in unspecified joint: Secondary | ICD-10-CM | POA: Diagnosis not present

## 2017-11-25 DIAGNOSIS — Z0000 Encounter for general adult medical examination without abnormal findings: Secondary | ICD-10-CM | POA: Diagnosis not present

## 2017-11-25 DIAGNOSIS — I69320 Aphasia following cerebral infarction: Secondary | ICD-10-CM | POA: Diagnosis not present

## 2017-11-25 DIAGNOSIS — I69351 Hemiplegia and hemiparesis following cerebral infarction affecting right dominant side: Secondary | ICD-10-CM | POA: Diagnosis not present

## 2017-11-26 DIAGNOSIS — R300 Dysuria: Secondary | ICD-10-CM | POA: Diagnosis not present

## 2017-11-30 ENCOUNTER — Ambulatory Visit: Payer: Medicare Other | Admitting: Physical Therapy

## 2017-11-30 ENCOUNTER — Encounter: Payer: Medicare Other | Admitting: Speech Pathology

## 2017-11-30 DIAGNOSIS — N390 Urinary tract infection, site not specified: Secondary | ICD-10-CM | POA: Diagnosis not present

## 2017-11-30 DIAGNOSIS — R300 Dysuria: Secondary | ICD-10-CM | POA: Diagnosis not present

## 2017-11-30 DIAGNOSIS — I69351 Hemiplegia and hemiparesis following cerebral infarction affecting right dominant side: Secondary | ICD-10-CM | POA: Diagnosis not present

## 2017-11-30 DIAGNOSIS — I69320 Aphasia following cerebral infarction: Secondary | ICD-10-CM | POA: Diagnosis not present

## 2017-12-02 ENCOUNTER — Ambulatory Visit: Payer: Medicare Other | Admitting: Physical Therapy

## 2017-12-02 ENCOUNTER — Encounter: Payer: Medicare Other | Admitting: Speech Pathology

## 2017-12-02 DIAGNOSIS — I69351 Hemiplegia and hemiparesis following cerebral infarction affecting right dominant side: Secondary | ICD-10-CM | POA: Diagnosis not present

## 2017-12-02 DIAGNOSIS — I69320 Aphasia following cerebral infarction: Secondary | ICD-10-CM | POA: Diagnosis not present

## 2017-12-03 DIAGNOSIS — R633 Feeding difficulties: Secondary | ICD-10-CM | POA: Diagnosis not present

## 2017-12-03 DIAGNOSIS — R1310 Dysphagia, unspecified: Secondary | ICD-10-CM | POA: Diagnosis not present

## 2017-12-07 DIAGNOSIS — I69351 Hemiplegia and hemiparesis following cerebral infarction affecting right dominant side: Secondary | ICD-10-CM | POA: Diagnosis not present

## 2017-12-07 DIAGNOSIS — I69320 Aphasia following cerebral infarction: Secondary | ICD-10-CM | POA: Diagnosis not present

## 2017-12-09 DIAGNOSIS — I69351 Hemiplegia and hemiparesis following cerebral infarction affecting right dominant side: Secondary | ICD-10-CM | POA: Diagnosis not present

## 2017-12-09 DIAGNOSIS — I69320 Aphasia following cerebral infarction: Secondary | ICD-10-CM | POA: Diagnosis not present

## 2017-12-14 DIAGNOSIS — I69351 Hemiplegia and hemiparesis following cerebral infarction affecting right dominant side: Secondary | ICD-10-CM | POA: Diagnosis not present

## 2017-12-14 DIAGNOSIS — I69320 Aphasia following cerebral infarction: Secondary | ICD-10-CM | POA: Diagnosis not present

## 2017-12-16 ENCOUNTER — Telehealth: Payer: Self-pay | Admitting: Adult Health

## 2017-12-16 NOTE — Telephone Encounter (Signed)
Wanda @ [facility] returned call to [place] to let her know she understands Dr Leonie Man will be out of the office next week.  Mariann Laster is asking that as soon as Dr Leonie Man returns he signs off on the orders and has it faxed to her at [phone]

## 2017-12-16 NOTE — Telephone Encounter (Signed)
Order form sign by Dr. Leonie Man. RN notified him of signature needed. Form fax to [phone].

## 2017-12-17 DIAGNOSIS — I69351 Hemiplegia and hemiparesis following cerebral infarction affecting right dominant side: Secondary | ICD-10-CM | POA: Diagnosis not present

## 2017-12-17 DIAGNOSIS — I69320 Aphasia following cerebral infarction: Secondary | ICD-10-CM | POA: Diagnosis not present

## 2017-12-28 DIAGNOSIS — E559 Vitamin D deficiency, unspecified: Secondary | ICD-10-CM | POA: Diagnosis not present

## 2017-12-28 DIAGNOSIS — N390 Urinary tract infection, site not specified: Secondary | ICD-10-CM | POA: Diagnosis not present

## 2017-12-28 DIAGNOSIS — R739 Hyperglycemia, unspecified: Secondary | ICD-10-CM | POA: Diagnosis not present

## 2017-12-28 DIAGNOSIS — E785 Hyperlipidemia, unspecified: Secondary | ICD-10-CM | POA: Diagnosis not present

## 2017-12-28 DIAGNOSIS — I10 Essential (primary) hypertension: Secondary | ICD-10-CM | POA: Diagnosis not present

## 2017-12-28 DIAGNOSIS — R300 Dysuria: Secondary | ICD-10-CM | POA: Diagnosis not present

## 2017-12-28 DIAGNOSIS — R945 Abnormal results of liver function studies: Secondary | ICD-10-CM | POA: Diagnosis not present

## 2017-12-31 DIAGNOSIS — R945 Abnormal results of liver function studies: Secondary | ICD-10-CM | POA: Diagnosis not present

## 2017-12-31 DIAGNOSIS — R740 Nonspecific elevation of levels of transaminase and lactic acid dehydrogenase [LDH]: Secondary | ICD-10-CM | POA: Diagnosis not present

## 2017-12-31 DIAGNOSIS — N390 Urinary tract infection, site not specified: Secondary | ICD-10-CM | POA: Diagnosis not present

## 2018-01-03 ENCOUNTER — Ambulatory Visit (INDEPENDENT_AMBULATORY_CARE_PROVIDER_SITE_OTHER): Payer: Medicare Other | Admitting: Adult Health

## 2018-01-03 ENCOUNTER — Encounter: Payer: Self-pay | Admitting: Adult Health

## 2018-01-03 VITALS — BP 119/78 | HR 70 | Ht 63.0 in | Wt 152.6 lb

## 2018-01-03 DIAGNOSIS — G3184 Mild cognitive impairment, so stated: Secondary | ICD-10-CM

## 2018-01-03 DIAGNOSIS — E782 Mixed hyperlipidemia: Secondary | ICD-10-CM

## 2018-01-03 DIAGNOSIS — E0800 Diabetes mellitus due to underlying condition with hyperosmolarity without nonketotic hyperglycemic-hyperosmolar coma (NKHHC): Secondary | ICD-10-CM | POA: Diagnosis not present

## 2018-01-03 DIAGNOSIS — I6381 Other cerebral infarction due to occlusion or stenosis of small artery: Secondary | ICD-10-CM

## 2018-01-03 DIAGNOSIS — I10 Essential (primary) hypertension: Secondary | ICD-10-CM | POA: Diagnosis not present

## 2018-01-03 NOTE — Patient Instructions (Signed)
Continue clopidogrel 75 mg daily for secondary stroke prevention  We will request a copy from your PCP for your cholesterol numbers   Continue to stay active and eat healthy  Continue to follow up with PCP regarding cholesterol and blood pressure management   Continue to monitor blood pressure at home  Maintain strict control of hypertension with blood pressure goal below 130/90, diabetes with hemoglobin A1c goal below 6.5% and cholesterol with LDL cholesterol (bad cholesterol) goal below 70 mg/dL. I also advised the patient to eat a healthy diet with plenty of whole grains, cereals, fruits and vegetables, exercise regularly and maintain ideal body weight.  Followup in the future with me in 6 months or call earlier if needed       Thank you for coming to see [place] at [facility]. I hope we have been able to provide you high quality care today.  You may receive a patient satisfaction survey over the next few weeks. We would appreciate your feedback and comments so that we may continue to improve ourselves and the health of our patients.

## 2018-01-03 NOTE — Progress Notes (Signed)
[facility] [address] [place]. [place] [address] [phone]       OFFICE FOLLOW UP VISIT NOTE  Ms. LIANE TRIBBEY Date of Birth:  06-Aug-1929 Medical Record Number:  [id number]   Reason for Referral:  Memory and balance difficulties  HPI: Initial consult 03/01/17 : Ms Graw is a pleasant 84 year old Caucasian lady who is accompanied today by her son and granddaughter. She has been having some memory and cognitive difficulties for several months. She attributes this due to significant stress that she is going through. She is in the process of selling her house and is currently building a house in [place] where she plans to move to be close to her family. 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 She had times feels she has trouble remembering recent information as well as at times finding words and completing sentences. Her son feels that her communication is not as crisp as it used to be and she has trouble reading words out and cannot think and sharply. Patient is still independent and manages all activities of daily living. She denies any significant headaches, head injury with loss of consciousness, seizures. She has a prior history of small stroke in July 2015 when she saw me for episode of gait ataxia and some mild right-sided weakness which lasted several days this event and MRI scan of the brain done on 01/14/2014 which I personally reviewed and shows moderate changes of small vessel disease and several tiny microhemorrhages on gradient echo images. Intracranial and extracranial vascular imaging has not been performed and echo has also not been done. Hemoglobin A1c was borderline at 6.3 and lipid profile showed LDL of 94 mg percent She is on aspirin daily which is tolerating well without side effects. She is also on pravastatin and is tolerating it well without muscle aches or pains. She also takes fish oil daily. She's had some  mild balance difficulties for long time but feels awful 8 when she makes a sudden turn or tries to walk quickly she is insecure. She is a had a couple of minor falls but has not had fortunately any major injuries. She denies any tingling numbness burning or pain in her feet. She denies feeling depressed but does admit to some anxiety due to her move. There is no history of episodes of confusion, disorientation. She still driving and has never gotten lost. There is no history of delusions, hallucinations or agitation or abnormal behavior   04/28/2017 visit ; she returns for follow-up after last visit 2 months ago. She is accompanied by her son and granddaughter. Patient states she is doing well she's had no stroke or TIA symptoms. She continues to have dizziness and imbalance. She is careful if she gets gets up slowly but if she makes sudden movements she is more off-balance. She is at no falls or injuries. She did undergo MRI scan of the brain on 03/11/17 which I personally reviewed shows 2 subacute small white matter infarcts on either side which appear to be lacunar in nature. There are also remote age lacunar infarcts. CT angiogram of the brain and neck was performed on 04/20/17 which shows severely calcified bulky plaque extends stenosis involving the right brachiocephalic and left subclavian arteries. There was a tiny 2 mm posterior right internal carotid artery terminus aneurysm or infundibulum. I personally reviewed the imaging films and discuss with the family. Patient denies any symptoms of vertebrobasilar steal in the form  of dizziness and lightheadedness with arms raised about the shoulder. All memory panel labs done on 03/01/17 were normal.  07/21/2017 visit : She returns for follow-up after recent hospital admission on 06/04/2017 with episode of loss of consciousness and fall. She is accompanied by her granddaughter and son who provide history. I have personally reviewed electronic medical records and  imaging films. She was emergently admitted for consideration for subclavian steal syndrome given the fact that cerebral catheter angiogram in October 2018 had shown 85% proximal left subclavian stenosis. Patient did complain of confusion and vertical diplopia on admission. She had some amnesia after her fall. MRI scan of the brain done on 06/04/17 showed small right cerebral peduncle and right thalamus punctate lacunar infarct. She underwent cerebral catheter angiogram on 06/06/17 by Dr. Estanislado Pandy but it showed only 65% proximal left subclavian stenosis without clear evidence of subclavian steal. Hence it was decided to treat her medically and subclavian stenting was not performed. Patient was seen by physical occupational therapy and has been getting therapy. She is doing better now she can walk short distances independently. Her blood pressure has been pretty good she is brought with her today her blood pressure log mostly transcend 110 to 130s. Today it is slightly elevated in my office at 157/93. She was changed from Lipitor to Pravachol as she had some side effects. She continues to have mild short-term memory difficulties but these have not progressive. She does not parts. In cognitively challenging activities. At last office visit in September she had scored 29/30 on the Mini-Mental status exam.  11/03/17 visit: Patient is being seen today for follow-up and is accompanied by her son.  Son states she was doing well until 10/09/2017 when she went to bed normal but woke up "not acting the same".  She was unable to control her bladder, she was struggling to get the right words out and had an increase in fatigue.  Denies balance issues, increased weakness on one side, or increasing numbness and tingling.  The son states that her blood pressure was in the 017P systolically around that time and was recently started on lisinopril by PCP.  That morning her blood pressure was in the lower 100s and he feels as though her  blood pressure was not adequate enough to perfuse as she does have a history of stenosis.  Patient was not seen or had any imaging done since this time.  She is currently taking aspirin 81 mg without increase in bleeding or bruising.  Per Dr. Lenell Antu office note at last visit, he recommended continuing aspirin and Plavix for 3 months and then continue Plavix alone.  Son was not aware of this recommendation.  Continues take pravastatin without increase in myalgias.  Family concern for recent worsening of memory as well.  MMSE performed today and scored a 14/30 (previous MMSE 01/2017 29/30).  Patient was undergoing outpatient PT/SLP but was recommended by outpatient therapist for her to receive home PT/SLP.  I agree with this recommendation at this time but also would recommend adding OT.  Patient would gain great benefit from having therapies in her own home.  She also continues to have problems with her speech where she is unable to get the words out but knows what she wants to say.  Denies any issue with understanding what people are trying to say to her.  Also endorses some signs and symptoms of depression but refuses to have medication treatment for it at this time as she would like  to wait and see if this gets better.  Highly recommend at this time to obtain MRI brain to assess for possible stroke.  As patient does have history of 65% carotid stenosis, will also be beneficial to asses obtain MRA of head and neck.  01/03/18 UPDATE: Patient returns today for 55-month follow-up visit and is accompanied by her son.  MRI was ordered after last visit for new onset neurological symptoms.  MRI obtained on 11/10/2017 and did show acute lacunar infarct secondary to small vessel disease.  MRA head/neck negative.  Per son, patient was complaining of severe joint pain so he stopped the Aricept thinking this was the cause.  After approximately 1 week of being off Aricept, patient continued to complain of joint pain and at that  time pravastatin was stopped.  Joint pain was relieved within 24 hours of stopping pravastatin.  Aricept has not been restarted.  Both patient and son believe that her memory has been the same and has not gotten better but has not gotten worse.  Patient has been going to senior center daily for exercise.  Continues to take Plavix without bleeding or bruising.  Blood pressure today satisfactory at 119/78.  Patient states that she recently had blood work done by her PCP.  Unable to see results in epic or care everywhere but per patient and son, PCP stated all of her labs look good and statin was not needed to restart.  Patient would like to hold off on restarting Aricept at this time.  Per son, patient has been acting more like herself where she wants to be active and social.  Denies new or worsening stroke/TIA symptoms.  ROS:   14 system review of systems is positive for incontinence of bladder, aching muscles and memory loss and all other systems negative  PMH:  Past Medical History:  Diagnosis Date  . Coronary artery disease   . Hypertension   . Myocardial infarction ([place]) 1986  . Osteoporosis   . Recurrent UTI   . Stroke [facility])     Social History:  Social History   Socioeconomic History  . Marital status: Married    Spouse name: Not on file  . Number of children: 4  . Years of education: 12th  . Highest education level: Not on file  Occupational History  . Occupation: Therapist, music: [employer]  Social Needs  . Financial resource strain: Not on file  . Food insecurity:    Worry: Not on file    Inability: Not on file  . Transportation needs:    Medical: Not on file    Non-medical: Not on file  Tobacco Use  . Smoking status: Former Research scientist (life sciences)  . Smokeless tobacco: Never Used  Substance and Sexual Activity  . Alcohol use: No  . Drug use: No  . Sexual activity: Never  Lifestyle  . Physical activity:    Days per week: Not on file    Minutes per session: Not on file    . Stress: Not on file  Relationships  . Social connections:    Talks on phone: Not on file    Gets together: Not on file    Attends religious service: Not on file    Active member of club or organization: Not on file    Attends meetings of clubs or organizations: Not on file    Relationship status: Not on file  . Intimate partner violence:    Fear of current or ex partner:  Not on file    Emotionally abused: Not on file    Physically abused: Not on file    Forced sexual activity: Not on file  Other Topics Concern  . Not on file  Social History Narrative   Patient lives at home alone    Patient is right handed   Patient drinks coffee daily    Medications:   Current Outpatient Medications on File Prior to Visit  Medication Sig Dispense Refill  . Biotin 10 MG TABS Take 1 tablet by mouth daily.    . Calcium Carbonate-Vitamin D 600-400 MG-UNIT tablet Take 2 tablets by mouth daily with breakfast.     . clopidogrel (PLAVIX) 75 MG tablet Take 1 tablet (75 mg total) by mouth daily. 30 tablet 11  . Cranberry 1000 MG CAPS Take 1,000 mg by mouth daily.    Marland Kitchen donepezil (ARICEPT) 5 MG tablet Take 1 tablet (5 mg total) by mouth at bedtime. 30 tablet 3  . GARLIC PO Take 1 capsule by mouth daily.    . metoprolol tartrate (LOPRESSOR) 50 MG tablet Take 25 mg daily by mouth.    . Multiple Vitamins-Minerals (MULTIVITAMIN WITH MINERALS) tablet Take 1 tablet by mouth daily.    . Omega-3 Fatty Acids (FISH OIL) 1200 MG CAPS Take 2 capsules by mouth daily.     . pravastatin (PRAVACHOL) 80 MG tablet Take 40 mg by mouth daily.    Marland Kitchen trimethoprim (TRIMPEX) 100 MG tablet Take 100 mg by mouth daily.      No current facility-administered medications on file prior to visit.     Allergies:   Allergies  Allergen Reactions  . Codeine     UNSPECIFIED REACTION     Physical Exam General: well developed, well nourished, pleasant elderly Caucasian lady seated, in no evident distress Head: head normocephalic  and atraumatic.   Neck: supple with no carotid or supraclavicular bruits Cardiovascular: regular rate and rhythm, no murmurs Musculoskeletal: no deformity Skin:  no rash/petichiae Vascular:  Normal pulses all extremities.Symmetric bilateral radial pulses at rest but Adson's test is positive on the left.  Neurologic Exam Mental Status: Awake and fully alert.  Attention span, concentration and fund of knowledge appropriate. Mood and affect appropriate. Mild expressive aphasia MMSE - Mini Mental State Exam 11/03/2017 03/01/2017  Orientation to time 1 5  Orientation to Place 3 5  Registration 3 3  Attention/ Calculation 0 5  Recall 0 2  Language- name 2 objects 2 2  Language- repeat 1 1  Language- follow 3 step command 3 3  Language- read & follow direction 1 1  Write a sentence 0 1  Copy design 0 1  Total score 14 29   Cranial Nerves: Fundoscopic exam reveals sharp disc margins. Pupils equal, briskly reactive to light. Extraocular movements full without nystagmus. Visual fields full to confrontation. Hearing intact. Facial sensation intact. Face, tongue, palate moves normally and symmetrically.  Motor: Normal bulk and tone. Normal strength in all tested extremity muscles.  Mild weakness in RUE Sensory.: intact to touch , pinprick , position and vibratory sensation.  Coordination: Rapid alternating movements normal in all extremities. Finger-to-nose and heel-to-shin performed accurately bilaterally.  Left hand orbits over right hand. Gait and Station: Arises from chair without difficulty. Stance is slightly broad-based. Gait demonstrates normal stride length and mild imbalance .   Reflexes: 1+ and symmetric. Toes downgoing.   IMAGING STUDIES:  CT HEAD WO CONTRAST 11/04/17 IMPRESSION: 1. No acute intracranial pathology.  MR BRAIN WO  CONTRAST 11/12/17 No she she is a memory that I see her why Abnormal MRI brain (without) demonstrating: 1. Acute-subcute left internal capsule and basal  ganglia ischemic infarction with peripheral hemorrhagic component. This is a new finding compared to MRI on 06/05/17. 2. Moderate to severe periventricular and subcortical chronic small vessel ischemic disease.  Right cerebellar chronic ischemic infarction. 3. Multiple chronic cerebral microhemorrhages throughout the brain on SWI views.  MR MRA HEAD WO CONTRAST MR MRA NECK W WO CONTRAST 11/12/2017 IMPRESSION:  MRA head (without) demonstrating: - Distal bilateral middle cerebral arteries have decreased flow signal may be due to technical artifact or atherosclerosis. - Posterior circulation is hypoplastic. IMPRESSION:  -Normal MRA neck (with and without).     ASSESSMENT: 9 year Caucasian lady with mild memory and cognitive difficulties likely from mild cognitive impairment. Mild gait and balance difficulties likely multifactorial secondary to age-related small vessel disease and prior strokes . Recurrent small right brainstem and thalamic infarcts in November 2018 likely from small vessel disease. Cerebral angiogram shows moderate proximal left subclavian stenosis but clinical exam and history do not support subclavian steal.  Patient returns today for follow up visit. Recent MRI on 11/2017 did show near lacunar infarct secondary to small vessel disease.   PLAN: -continue plavix 75mg  for secondary stroke prevention -we will obtain recent labs from PCP for lipid levels and consider starting crestor -notify us if you would like to restart aricept for memory -continue to work out and eat a healthy diet -Maintain strict control of hypertension with blood pressure goal below 130/90, diabetes with hemoglobin A1c goal below 6.5% and cholesterol with LDL cholesterol (bad cholesterol) goal below 70 mg/dL. I also advised the patient to eat a healthy diet with plenty of whole grains, cereals, fruits and vegetables, exercise regularly and maintain ideal body weight.  Followup in the future with me in 6  months  Greater than 50% time during this 25 minute consultation visit was spent on counseling and coordination of care about HLD and HTN (risk factors), discussion about risk benefit of anticoagulation and answering question  Venancio Poisson, [facility] [address] [place], [place] [address]  Phone [phone] Fax [phone]

## 2018-01-04 ENCOUNTER — Other Ambulatory Visit (HOSPITAL_COMMUNITY): Payer: Self-pay | Admitting: Physician Assistant

## 2018-01-04 NOTE — Progress Notes (Signed)
I agree with the above plan 

## 2018-01-06 ENCOUNTER — Other Ambulatory Visit: Payer: Self-pay | Admitting: Physician Assistant

## 2018-01-06 ENCOUNTER — Other Ambulatory Visit (HOSPITAL_COMMUNITY): Payer: Self-pay | Admitting: Physician Assistant

## 2018-01-06 DIAGNOSIS — R945 Abnormal results of liver function studies: Secondary | ICD-10-CM

## 2018-01-06 DIAGNOSIS — R7989 Other specified abnormal findings of blood chemistry: Secondary | ICD-10-CM

## 2018-01-12 ENCOUNTER — Other Ambulatory Visit (HOSPITAL_COMMUNITY): Payer: Self-pay | Admitting: Physician Assistant

## 2018-01-12 DIAGNOSIS — R7989 Other specified abnormal findings of blood chemistry: Secondary | ICD-10-CM

## 2018-01-12 DIAGNOSIS — R945 Abnormal results of liver function studies: Secondary | ICD-10-CM

## 2018-01-13 ENCOUNTER — Ambulatory Visit
Admission: RE | Admit: 2018-01-13 | Discharge: 2018-01-13 | Disposition: A | Discharge status: Home / Self Care | Payer: Medicare Other | Source: Ambulatory Visit | Attending: Physician Assistant | Admitting: Physician Assistant

## 2018-01-13 DIAGNOSIS — K7689 Other specified diseases of liver: Secondary | ICD-10-CM | POA: Diagnosis not present

## 2018-01-13 DIAGNOSIS — R945 Abnormal results of liver function studies: Secondary | ICD-10-CM | POA: Insufficient documentation

## 2018-01-13 DIAGNOSIS — R7989 Other specified abnormal findings of blood chemistry: Secondary | ICD-10-CM

## 2018-01-17 DIAGNOSIS — M2550 Pain in unspecified joint: Secondary | ICD-10-CM | POA: Diagnosis not present

## 2018-01-19 ENCOUNTER — Ambulatory Visit: Payer: Medicare Other | Admitting: Neurology

## 2018-02-08 DIAGNOSIS — R7989 Other specified abnormal findings of blood chemistry: Secondary | ICD-10-CM | POA: Diagnosis not present

## 2018-02-08 DIAGNOSIS — M2550 Pain in unspecified joint: Secondary | ICD-10-CM | POA: Diagnosis not present

## 2018-02-19 ENCOUNTER — Emergency Department: Payer: Medicare Other

## 2018-02-19 ENCOUNTER — Emergency Department
Admission: EM | Admit: 2018-02-19 | Discharge: 2018-02-19 | Disposition: A | Discharge status: Home / Self Care | Payer: Medicare Other | Attending: Emergency Medicine | Admitting: Emergency Medicine

## 2018-02-19 ENCOUNTER — Other Ambulatory Visit: Payer: Self-pay

## 2018-02-19 ENCOUNTER — Encounter: Payer: Self-pay | Admitting: Emergency Medicine

## 2018-02-19 DIAGNOSIS — Z87891 Personal history of nicotine dependence: Secondary | ICD-10-CM | POA: Insufficient documentation

## 2018-02-19 DIAGNOSIS — R0789 Other chest pain: Secondary | ICD-10-CM

## 2018-02-19 DIAGNOSIS — R109 Unspecified abdominal pain: Secondary | ICD-10-CM

## 2018-02-19 DIAGNOSIS — R0602 Shortness of breath: Secondary | ICD-10-CM | POA: Diagnosis not present

## 2018-02-19 DIAGNOSIS — K760 Fatty (change of) liver, not elsewhere classified: Secondary | ICD-10-CM | POA: Diagnosis not present

## 2018-02-19 DIAGNOSIS — Z79899 Other long term (current) drug therapy: Secondary | ICD-10-CM | POA: Insufficient documentation

## 2018-02-19 DIAGNOSIS — R1011 Right upper quadrant pain: Secondary | ICD-10-CM | POA: Diagnosis not present

## 2018-02-19 DIAGNOSIS — Z7982 Long term (current) use of aspirin: Secondary | ICD-10-CM | POA: Diagnosis not present

## 2018-02-19 DIAGNOSIS — I252 Old myocardial infarction: Secondary | ICD-10-CM | POA: Insufficient documentation

## 2018-02-19 DIAGNOSIS — R079 Chest pain, unspecified: Secondary | ICD-10-CM | POA: Diagnosis not present

## 2018-02-19 DIAGNOSIS — I2510 Atherosclerotic heart disease of native coronary artery without angina pectoris: Secondary | ICD-10-CM | POA: Insufficient documentation

## 2018-02-19 DIAGNOSIS — R404 Transient alteration of awareness: Secondary | ICD-10-CM | POA: Diagnosis not present

## 2018-02-19 DIAGNOSIS — I10 Essential (primary) hypertension: Secondary | ICD-10-CM | POA: Insufficient documentation

## 2018-02-19 DIAGNOSIS — K449 Diaphragmatic hernia without obstruction or gangrene: Secondary | ICD-10-CM | POA: Diagnosis not present

## 2018-02-19 DIAGNOSIS — R410 Disorientation, unspecified: Secondary | ICD-10-CM | POA: Diagnosis not present

## 2018-02-19 LAB — BASIC METABOLIC PANEL
Anion gap: 8 (ref 5–15)
BUN: 17 mg/dL (ref 8–23)
CHLORIDE: 107 mmol/L (ref 98–111)
CO2: 25 mmol/L (ref 22–32)
CREATININE: 0.87 mg/dL (ref 0.44–1.00)
Calcium: 8.8 mg/dL — ABNORMAL LOW (ref 8.9–10.3)
GFR calc non Af Amer: 59 mL/min — ABNORMAL LOW (ref >60)
GLUCOSE: 127 mg/dL — AB (ref 70–99)
POTASSIUM: 4 mmol/L (ref 3.5–5.1)
Sodium: 140 mmol/L (ref 135–145)

## 2018-02-19 LAB — TROPONIN I
Troponin I: <0.03 ng/mL (ref <0.03)
Troponin I: <0.03 ng/mL (ref <0.03)

## 2018-02-19 LAB — CBC
HEMATOCRIT: 41.8 % (ref 35.0–47.0)
Hemoglobin: 14.3 g/dL (ref 12.0–16.0)
MCH: 34.2 pg — ABNORMAL HIGH (ref 26.0–34.0)
MCHC: 34.3 g/dL (ref 32.0–36.0)
MCV: 100 fL (ref 80.0–100.0)
Platelets: 154 10*3/uL (ref 150–440)
RBC: 4.19 MIL/uL (ref 3.80–5.20)
RDW: 13.2 % (ref 11.5–14.5)
WBC: 5.7 10*3/uL (ref 3.6–11.0)

## 2018-02-19 LAB — HEPATIC FUNCTION PANEL
ALBUMIN: 3.2 g/dL — AB (ref 3.5–5.0)
ALK PHOS: 43 U/L (ref 38–126)
ALT: 90 U/L — ABNORMAL HIGH (ref 0–44)
AST: 94 U/L — ABNORMAL HIGH (ref 15–41)
BILIRUBIN DIRECT: 0.2 mg/dL (ref 0.0–0.2)
BILIRUBIN TOTAL: 0.9 mg/dL (ref 0.3–1.2)
Indirect Bilirubin: 0.7 mg/dL (ref 0.3–0.9)
Total Protein: 6.4 g/dL — ABNORMAL LOW (ref 6.5–8.1)

## 2018-02-19 LAB — LIPASE, BLOOD: LIPASE: 49 U/L (ref 11–51)

## 2018-02-19 MED ORDER — IOPAMIDOL (ISOVUE-300) INJECTION 61%
100.0000 mL | Freq: Once | INTRAVENOUS | Status: AC | PRN
  Administered 2018-02-19: 100 mL via INTRAVENOUS

## 2018-02-19 NOTE — ED Triage Notes (Signed)
Pt presents to ED via [facility] with c/o sudden onset CP that started this morning. EMS reports initial pain 10/10, took 324 ASA PTA, EMS gave 2 SL nitro with complete relief of pain, pt denies pain on arrival. Pt c/o L sided chest pain with no radiation with SOB and diaphoresis per EMS. Pt is confused at baseline, is oriented to self and situation, disoriented to time and place.

## 2018-02-19 NOTE — ED Notes (Signed)
.   Pt is resting, Respirations even and unlabored, NAD. Stretcher lowest postion and locked. Call bell within reach. Denies any needs at this time RN will continue to monitor.    

## 2018-02-19 NOTE — ED Notes (Signed)
Cuff moved to different arm. Will retake pressure RN will monitior.

## 2018-02-19 NOTE — Discharge Instructions (Addendum)
To the emergency room for any new or worsening symptoms including chest pain, shortness of breath, or if you feel worse in any way.  We did discuss admission to the hospital but you would prefer to go home which is certainly your choice however, it does mean that you do need to follow closely with your cardiologist.  Dr. Donivan Scull office will call you in the next few days.  Please take his call and if you have any new or worrisome symptoms return to the emergency department

## 2018-02-19 NOTE — ED Provider Notes (Addendum)
[facility] Emergency Department Provider Note  ____________________________________________   I have reviewed the triage vital signs and the nursing notes. Where available I have reviewed prior notes and, if possible and indicated, outside hospital notes.    HISTORY  Chief Complaint Chest Pain    HPI Tracey Hartman is a 84 y.o. female history of CAD in the 58s, history of hypertension recurrent UTIs, CVA, no recollected abdominal surgery presents today complaining of what she described as a chest pressure but when she describes that she points to her epigastric region.  Happened yesterday x1, it went away after a brief period, it happened again today.  At this time she is pain-free, she states it was significant at the time.  She has not yet had anything to eat or drink today.  She denies any fever or chills nausea or vomiting with this.  She states adamantly that she was not short of breath.  Patient does have a history of dementia.  History is supplemented by her son who is in the room.  Patient gives permission for her son to assist.  Patient has no radiation of the pain, no exertional symptoms she is been able to walk around with no difficulty, seem to happen at rest both times.   She has no ongoing complaints at this moment.  Family states that she is at her baseline     Past Medical History:  Diagnosis Date  . Coronary artery disease   . Hypertension   . Myocardial infarction ([place]) 1986  . Osteoporosis   . Recurrent UTI   . Stroke [facility])     Patient Active Problem List   Diagnosis Date Noted  . Cerebral thrombosis with cerebral infarction 06/05/2017  . Syncope 06/04/2017  . Lacunar infarction ([place]) 02/20/2014  . Small vessel disease, cerebrovascular 02/20/2014  . Abnormality of gait 02/20/2014  . HYPERCHOLESTEROLEMIA  IIA 08/25/2010  . CORONARY ATHEROSCLEROSIS NATIVE CORONARY ARTERY 08/25/2010  . CHEST PAIN, PRECORDIAL 08/25/2010    Past  Surgical History:  Procedure Laterality Date  . APPENDECTOMY    . CATARACT EXTRACTION Bilateral   . COLONOSCOPY    . IR ANGIO INTRA EXTRACRAN SEL COM CAROTID INNOMINATE BILAT MOD SED  05/13/2017  . IR ANGIO VERTEBRAL SEL SUBCLAVIAN INNOMINATE UNI L MOD SED  06/16/2017  . IR ANGIO VERTEBRAL SEL SUBCLAVIAN INNOMINATE UNI R MOD SED  05/13/2017  . IR ANGIO VERTEBRAL SEL VERTEBRAL UNI L MOD SED  05/13/2017  . IR RADIOLOGIST EVAL & MGMT  06/08/2017  . RADIOLOGY WITH ANESTHESIA N/A 06/16/2017   Procedure: ANGIOPLASTY WITH STENTING;  Surgeon: Luanne Bras, MD;  Location: [place];  Service: Radiology;  Laterality: N/A;    Prior to Admission medications   Medication Sig Start Date End Date Taking? Authorizing Provider  aspirin EC 81 MG tablet Take by mouth. 06/03/14   [provider]  Biotin 10 MG TABS Take 1 tablet by mouth daily.    [provider]  Calcium Carbonate-Vitamin D 600-400 MG-UNIT tablet Take 2 tablets by mouth daily with breakfast.     [provider]  cephALEXin (KEFLEX) 250 MG capsule Take by mouth daily.    [provider]  clopidogrel (PLAVIX) 75 MG tablet Take 1 tablet (75 mg total) by mouth daily. 04/28/17   Garvin Fila, MD  Cranberry 1000 MG CAPS Take 1,000 mg by mouth daily.    [provider]  Garlic 829 MG CAPS Take by mouth.    [provider]  metoprolol tartrate (LOPRESSOR) 50 MG tablet Take 25 mg daily by mouth.    [provider]  Multiple Vitamins-Minerals (MULTIVITAMIN WITH MINERALS) tablet Take 1 tablet by mouth daily.    [provider]  Omega-3 Fatty Acids (FISH OIL) 1200 MG CAPS Take 2 capsules by mouth daily.     [provider]    Allergies Codeine  Family History  Problem Relation Age of Onset  . Kidney failure Mother        renal failure  . Heart attack Father     Social History Social History   Tobacco Use  . Smoking status: Former Research scientist (life sciences)  . Smokeless  tobacco: Never Used  Substance Use Topics  . Alcohol use: No  . Drug use: No    Review of Systems Constitutional: No fever/chills Eyes: No visual changes. ENT: No sore throat. No stiff neck no neck pain Cardiovascular: She states she had chest pain but points to epigastric region  Respiratory: Denies shortness of breath. Gastrointestinal:   no vomiting.  No diarrhea.  No constipation. Genitourinary: Negative for dysuria. Musculoskeletal: Negative lower extremity swelling Skin: Negative for rash. Neurological: Negative for severe headaches, focal weakness or numbness.   ____________________________________________   PHYSICAL EXAM:  VITAL SIGNS: ED Triage Vitals  Enc Vitals Group     BP 02/19/18 1048 128/88     Pulse Rate 02/19/18 1048 72     Resp 02/19/18 1048 20     Temp 02/19/18 1048 97.7 F (36.5 C)     Temp Source 02/19/18 1048 Oral     SpO2 02/19/18 1048 93 %     Weight 02/19/18 1039 150 lb (68 kg)     Height 02/19/18 1039 5\' 6"  (1.676 m)     Head Circumference --      Peak Flow --      Pain Score 02/19/18 1039 0     Pain Loc --      Pain Edu? --      Excl. in [place]? --     Constitutional: Alert and oriented. Well appearing and in no acute distress. Eyes: Conjunctivae are normal Head: Atraumatic HEENT: No congestion/rhinnorhea. Mucous membranes are moist.  Oropharynx non-erythematous Neck:   Nontender with no meningismus, no masses, no stridor Cardiovascular: Normal rate, regular rhythm. Grossly normal heart sounds.  Good peripheral circulation. Respiratory: Normal respiratory effort.  No retractions. Lungs CTAB. Abdominal: Soft and positive tenderness and voluntary guarding focally to the right upper quadrant otherwise benign exam this does seem to reproduce the pain she was feeling before.  No rebound.  Back:  There is no focal tenderness or step off.  there is no midline tenderness there are no lesions noted. there is no CVA tenderness Musculoskeletal: No  lower extremity tenderness, no upper extremity tenderness. No joint effusions, no DVT signs strong distal pulses no edema Neurologic:  Normal speech and language. No gross focal neurologic deficits are appreciated.  Skin:  Skin is warm, dry and intact. No rash noted. Psychiatric: Mood and affect are normal. Speech and behavior are normal.  ____________________________________________   LABS (all labs ordered are listed, but only abnormal results are displayed)  Labs Reviewed  BASIC METABOLIC PANEL - Abnormal; Notable for the following components:      Result Value   Glucose, Bld 127 (*)    Calcium 8.8 (*)    GFR calc non Af Amer 59 (*)    All other components within normal limits  CBC -  Abnormal; Notable for the following components:   MCH 34.2 (*)    All other components within normal limits  HEPATIC FUNCTION PANEL - Abnormal; Notable for the following components:   Total Protein 6.4 (*)    Albumin 3.2 (*)    AST 94 (*)    ALT 90 (*)    All other components within normal limits  TROPONIN I  LIPASE, BLOOD    Pertinent labs  results that were available during my care of the patient were reviewed by me and considered in my medical decision making (see chart for details). ____________________________________________  EKG  I personally interpreted any EKGs ordered by me or triage Sinus rhythm rate 76 bpm no acute ST elevation or depression normal axis unremarkable EKG ____________________________________________  RADIOLOGY  Pertinent labs & imaging results that were available during my care of the patient were reviewed by me and considered in my medical decision making (see chart for details). If possible, patient and/or family made aware of any abnormal findings.  US Abdomen Limited Ruq  Result Date: 02/19/2018 CLINICAL DATA:  Right upper quadrant pain 3 hours. EXAM: ULTRASOUND ABDOMEN LIMITED RIGHT UPPER QUADRANT COMPARISON:  01/13/2018 and CT 03/20/2016 FINDINGS:  Gallbladder: No gallstones or wall thickening visualized. No sonographic Murphy sign noted by sonographer. Common bile duct: Diameter: 9.7 mm which is mildly prominent although unchanged from CT 2017. Liver: Mild increased hepatic parenchymal echogenicity compatible with steatosis without focal mass. Portal vein is patent on color Doppler imaging with normal direction of blood flow towards the liver. IMPRESSION: No acute hepatobiliary disease. Mild chronic prominence of the common bile duct measuring 9.7 mm. Hepatic steatosis without focal mass. Electronically Signed   By: Marin Olp M.D.   On: 02/19/2018 12:00   ____________________________________________    PROCEDURES  Procedure(s) performed: None  Procedures  Critical Care performed: None  ____________________________________________   INITIAL IMPRESSION / ASSESSMENT AND PLAN / ED COURSE  Pertinent labs & imaging results that were available during my care of the patient were reviewed by me and considered in my medical decision making (see chart for details).  Patient here describing epigastric discomfort, she has very reproducible right upper quadrant pain, liver fractions is slightly elevated.  Concern for gallbladder disease exist.  I did send a cardiac marker which is initially negative we may need to repeat that if other obvious pathology is not defied however at this time it does appear to be this is a primarily infradiaphragmatic pathology by ultrasound is pending, she is asymptomatic at this time EKG is reassuring obviously, ACS is a concern but given the way she describes her pain I think somewhat less   ----------------------------------------- 3:36 PM on 02/19/2018 -----------------------------------------  She did no acute distress no symptoms since she has been here, abdomen completely benign tolerating p.o., we did discuss with Dr. Ida Rogue of cardiology, her cardiologist group.  He feels the patient if she would  prefer to go home certainly could go home and they will follow-up with her on Monday.  Does not recommend admission in this context, I discussed with the patient her very strong preference is to go home.  Return precautions follow-up given and understood.  Troponins negative x2 EKG reassuring vital signs reassuring blood work reassuring exam reassuring CT reassuring ultrasound reassuring   ____________________________________________   FINAL CLINICAL IMPRESSION(S) / ED DIAGNOSES  Final diagnoses:  RUQ abdominal pain      This chart was dictated using voice recognition software.  Despite best efforts to  proofread,  errors can occur which can change meaning.      Schuyler Amor, MD 02/19/18 1215    Schuyler Amor, MD 02/19/18 1536

## 2018-02-22 ENCOUNTER — Telehealth: Payer: Self-pay

## 2018-02-22 NOTE — Telephone Encounter (Signed)
-----   Message from Valora Corporal, RN sent at 02/21/2018  3:32 PM EDT ----- Regarding: FW: appointment Could you assist with getting this lady appointment?  Thanks ----- Message ----- From: Minna Merritts, MD Sent: 02/19/2018   3:18 PM To: Rebeca Alert Burl Triage Subject: appointment                                    Patient seen in emergency room on Saturday Needs clinic appointment for new patient Had chest pain, somewhat atypical but does have history of coronary disease Last seen by Dr. Lia Foyer 2012 Any provider TG

## 2018-02-22 NOTE — Telephone Encounter (Signed)
Lmov for patient to call back and schedule appointment   ED new pt appointment

## 2018-02-23 NOTE — Telephone Encounter (Signed)
lmov to call back for opening today with Sharolyn Douglas NP

## 2018-02-24 ENCOUNTER — Telehealth: Payer: Self-pay

## 2018-02-24 NOTE — Telephone Encounter (Signed)
See previous phone note.  

## 2018-02-24 NOTE — Telephone Encounter (Signed)
-----   Message from Emily Filbert, RN sent at 02/24/2018  8:03 AM EDT ----- Regarding: FW: appointment Patient needs a new patient office visit per TG- how soon can she be seen by anyone? ----- Message ----- From: Minna Merritts, MD Sent: 02/19/2018   3:18 PM To: Rebeca Alert Burl Triage Subject: appointment                                    Patient seen in emergency room on Saturday Needs clinic appointment for new patient Had chest pain, somewhat atypical but does have history of coronary disease Last seen by Dr. Lia Foyer 2012 Any provider TG

## 2018-02-24 NOTE — Telephone Encounter (Signed)
Lmov for patient to call back and schedule appointment   ED new pt appointment

## 2018-02-24 NOTE — Telephone Encounter (Signed)
lmov x 2 for patient to contact office to schedule an appt.  Will reach out again today   Next available is 9/20

## 2018-02-28 NOTE — Telephone Encounter (Signed)
Any luck trying to reach her?

## 2018-02-28 NOTE — Telephone Encounter (Signed)
Sent letter

## 2018-03-01 NOTE — Telephone Encounter (Signed)
lmov to schedule appt  °

## 2018-03-03 NOTE — Telephone Encounter (Signed)
To scheduling as this is just to schedule New Patient appt.

## 2018-03-04 NOTE — Telephone Encounter (Signed)
lmov to schedule. Mailed Letter.

## 2018-03-16 DIAGNOSIS — E663 Overweight: Secondary | ICD-10-CM | POA: Diagnosis not present

## 2018-03-16 DIAGNOSIS — R768 Other specified abnormal immunological findings in serum: Secondary | ICD-10-CM | POA: Diagnosis not present

## 2018-03-16 DIAGNOSIS — M545 Low back pain: Secondary | ICD-10-CM | POA: Diagnosis not present

## 2018-03-16 DIAGNOSIS — Z6826 Body mass index (BMI) 26.0-26.9, adult: Secondary | ICD-10-CM | POA: Diagnosis not present

## 2018-04-05 ENCOUNTER — Other Ambulatory Visit: Payer: Self-pay | Admitting: Neurology

## 2018-04-05 DIAGNOSIS — G450 Vertebro-basilar artery syndrome: Secondary | ICD-10-CM

## 2018-04-12 DIAGNOSIS — R739 Hyperglycemia, unspecified: Secondary | ICD-10-CM | POA: Diagnosis not present

## 2018-04-12 DIAGNOSIS — I10 Essential (primary) hypertension: Secondary | ICD-10-CM | POA: Diagnosis not present

## 2018-04-12 DIAGNOSIS — R945 Abnormal results of liver function studies: Secondary | ICD-10-CM | POA: Diagnosis not present

## 2018-04-12 DIAGNOSIS — M5136 Other intervertebral disc degeneration, lumbar region: Secondary | ICD-10-CM | POA: Diagnosis not present

## 2018-04-12 DIAGNOSIS — K760 Fatty (change of) liver, not elsewhere classified: Secondary | ICD-10-CM | POA: Diagnosis not present

## 2018-04-12 DIAGNOSIS — R7989 Other specified abnormal findings of blood chemistry: Secondary | ICD-10-CM | POA: Diagnosis not present

## 2018-05-11 DIAGNOSIS — N390 Urinary tract infection, site not specified: Secondary | ICD-10-CM | POA: Diagnosis not present

## 2018-05-16 ENCOUNTER — Other Ambulatory Visit: Payer: Self-pay | Admitting: Orthopedic Surgery

## 2018-05-16 DIAGNOSIS — M546 Pain in thoracic spine: Secondary | ICD-10-CM | POA: Diagnosis not present

## 2018-05-16 DIAGNOSIS — M25551 Pain in right hip: Secondary | ICD-10-CM | POA: Diagnosis not present

## 2018-05-16 DIAGNOSIS — M79604 Pain in right leg: Secondary | ICD-10-CM

## 2018-05-16 DIAGNOSIS — M545 Low back pain, unspecified: Secondary | ICD-10-CM

## 2018-05-16 DIAGNOSIS — M79605 Pain in left leg: Secondary | ICD-10-CM

## 2018-05-19 ENCOUNTER — Emergency Department: Payer: Medicare Other

## 2018-05-19 ENCOUNTER — Encounter: Payer: Self-pay | Admitting: Emergency Medicine

## 2018-05-19 ENCOUNTER — Other Ambulatory Visit: Payer: Self-pay

## 2018-05-19 DIAGNOSIS — S199XXA Unspecified injury of neck, initial encounter: Secondary | ICD-10-CM | POA: Diagnosis not present

## 2018-05-19 DIAGNOSIS — S0101XA Laceration without foreign body of scalp, initial encounter: Secondary | ICD-10-CM | POA: Diagnosis not present

## 2018-05-19 DIAGNOSIS — Y92810 Car as the place of occurrence of the external cause: Secondary | ICD-10-CM | POA: Diagnosis not present

## 2018-05-19 DIAGNOSIS — I2510 Atherosclerotic heart disease of native coronary artery without angina pectoris: Secondary | ICD-10-CM | POA: Insufficient documentation

## 2018-05-19 DIAGNOSIS — S0003XA Contusion of scalp, initial encounter: Secondary | ICD-10-CM | POA: Diagnosis not present

## 2018-05-19 DIAGNOSIS — Y999 Unspecified external cause status: Secondary | ICD-10-CM | POA: Insufficient documentation

## 2018-05-19 DIAGNOSIS — Y9389 Activity, other specified: Secondary | ICD-10-CM | POA: Insufficient documentation

## 2018-05-19 DIAGNOSIS — W0110XA Fall on same level from slipping, tripping and stumbling with subsequent striking against unspecified object, initial encounter: Secondary | ICD-10-CM | POA: Diagnosis not present

## 2018-05-19 DIAGNOSIS — Z79899 Other long term (current) drug therapy: Secondary | ICD-10-CM | POA: Insufficient documentation

## 2018-05-19 DIAGNOSIS — Z87891 Personal history of nicotine dependence: Secondary | ICD-10-CM | POA: Diagnosis not present

## 2018-05-19 DIAGNOSIS — I252 Old myocardial infarction: Secondary | ICD-10-CM | POA: Insufficient documentation

## 2018-05-19 DIAGNOSIS — S60012A Contusion of left thumb without damage to nail, initial encounter: Secondary | ICD-10-CM | POA: Diagnosis not present

## 2018-05-19 DIAGNOSIS — M79645 Pain in left finger(s): Secondary | ICD-10-CM | POA: Diagnosis not present

## 2018-05-19 DIAGNOSIS — I10 Essential (primary) hypertension: Secondary | ICD-10-CM | POA: Insufficient documentation

## 2018-05-19 DIAGNOSIS — Z7901 Long term (current) use of anticoagulants: Secondary | ICD-10-CM | POA: Diagnosis not present

## 2018-05-19 DIAGNOSIS — Z8673 Personal history of transient ischemic attack (TIA), and cerebral infarction without residual deficits: Secondary | ICD-10-CM | POA: Diagnosis not present

## 2018-05-19 NOTE — ED Triage Notes (Addendum)
Pt to triage via w/c with no distress noted; son reports pt went to get out of car & fell hitting head; lac noted to right side scalp with no active bleeding; denies LOC but c/o HA; no cervical tenderness noted with palpation; also c/o left thumb pain--bruising noted

## 2018-05-20 ENCOUNTER — Ambulatory Visit
Admission: RE | Admit: 2018-05-20 | Discharge: 2018-05-20 | Disposition: A | Discharge status: Home / Self Care | Payer: Medicare Other | Source: Ambulatory Visit | Attending: Orthopedic Surgery | Admitting: Orthopedic Surgery

## 2018-05-20 ENCOUNTER — Emergency Department
Admission: EM | Admit: 2018-05-20 | Discharge: 2018-05-20 | Disposition: A | Discharge status: Home / Self Care | Payer: Medicare Other | Attending: Emergency Medicine | Admitting: Emergency Medicine

## 2018-05-20 DIAGNOSIS — S0003XA Contusion of scalp, initial encounter: Secondary | ICD-10-CM

## 2018-05-20 DIAGNOSIS — M79604 Pain in right leg: Secondary | ICD-10-CM

## 2018-05-20 DIAGNOSIS — M545 Low back pain, unspecified: Secondary | ICD-10-CM

## 2018-05-20 DIAGNOSIS — M48061 Spinal stenosis, lumbar region without neurogenic claudication: Secondary | ICD-10-CM | POA: Diagnosis not present

## 2018-05-20 DIAGNOSIS — M79605 Pain in left leg: Secondary | ICD-10-CM

## 2018-05-20 MED ORDER — IBUPROFEN 400 MG PO TABS
400.0000 mg | ORAL_TABLET | Freq: Once | ORAL | Status: AC
  Administered 2018-05-20: 400 mg via ORAL
  Filled 2018-05-20: qty 1

## 2018-05-20 NOTE — ED Provider Notes (Signed)
[facility] Emergency Department Provider Note   First MD Initiated Contact with Patient 05/20/18 0028     (approximate)  I have reviewed the triage vital signs and the nursing notes.   HISTORY  Chief Complaint Fall    HPI Tracey Hartman is a 84 y.o. female with bolus of chronic medical conditions presents to the emergency department with history of accidental fall while getting out of car, with resultant head injury. Patient denies any loss of consciousness. Patient does admit to left thumb pain as well. Patient's son at bedside states that since his mother had stroke she has had emesis or difficulty. He states that his mother attempted to get out of the car before he could get around to render assistance.   Past Medical History:  Diagnosis Date  . Coronary artery disease   . Hypertension   . Myocardial infarction ([place]) 1986  . Osteoporosis   . Recurrent UTI   . Stroke [facility])     Patient Active Problem List   Diagnosis Date Noted  . Cerebral thrombosis with cerebral infarction 06/05/2017  . Syncope 06/04/2017  . Lacunar infarction ([place]) 02/20/2014  . Small vessel disease, cerebrovascular 02/20/2014  . Abnormality of gait 02/20/2014  . HYPERCHOLESTEROLEMIA  IIA 08/25/2010  . CORONARY ATHEROSCLEROSIS NATIVE CORONARY ARTERY 08/25/2010  . CHEST PAIN, PRECORDIAL 08/25/2010    Past Surgical History:  Procedure Laterality Date  . APPENDECTOMY    . CATARACT EXTRACTION Bilateral   . COLONOSCOPY    . IR ANGIO INTRA EXTRACRAN SEL COM CAROTID INNOMINATE BILAT MOD SED  05/13/2017  . IR ANGIO VERTEBRAL SEL SUBCLAVIAN INNOMINATE UNI L MOD SED  06/16/2017  . IR ANGIO VERTEBRAL SEL SUBCLAVIAN INNOMINATE UNI R MOD SED  05/13/2017  . IR ANGIO VERTEBRAL SEL VERTEBRAL UNI L MOD SED  05/13/2017  . IR RADIOLOGIST EVAL & MGMT  06/08/2017  . RADIOLOGY WITH ANESTHESIA N/A 06/16/2017   Procedure: ANGIOPLASTY WITH STENTING;  Surgeon: Luanne Bras, MD;  Location:  [place];  Service: Radiology;  Laterality: N/A;    Prior to Admission medications   Medication Sig Start Date End Date Taking? Authorizing Provider  aspirin EC 81 MG tablet Take by mouth. 06/03/14   [provider]  Biotin 10 MG TABS Take 1 tablet by mouth daily.    [provider]  Calcium Carbonate-Vitamin D 600-400 MG-UNIT tablet Take 2 tablets by mouth daily with breakfast.     [provider]  cephALEXin (KEFLEX) 250 MG capsule Take by mouth daily.    [provider]  clopidogrel (PLAVIX) 75 MG tablet TAKE 1 TABLET BY MOUTH ONCE DAILY. 04/06/18   Venancio Poisson, NP  Cranberry 1000 MG CAPS Take 1,000 mg by mouth daily.    [provider]  Garlic 017 MG CAPS Take by mouth.    [provider]  metoprolol tartrate (LOPRESSOR) 50 MG tablet Take 25 mg daily by mouth.    [provider]  Multiple Vitamins-Minerals (MULTIVITAMIN WITH MINERALS) tablet Take 1 tablet by mouth daily.    [provider]  Omega-3 Fatty Acids (FISH OIL) 1200 MG CAPS Take 2 capsules by mouth daily.     [provider]    Allergies Codeine  Family History  Problem Relation Age of Onset  . Kidney failure Mother        renal failure  . Heart attack Father     Social History Social History   Tobacco Use  . Smoking status:  Former Smoker  . Smokeless tobacco: Never Used  Substance Use Topics  . Alcohol use: No  . Drug use: No    Review of Systems Constitutional: No fever/chills Eyes: No visual changes. ENT: No sore throat. Cardiovascular: Denies chest pain. Respiratory: Denies shortness of breath. Gastrointestinal: No abdominal pain.  No nausea, no vomiting.  No diarrhea.  No constipation. Genitourinary: Negative for dysuria. Musculoskeletal: Negative for neck pain.  Negative for back pain. Integumentary: Negative for rash. Neurological: Negative for headaches, focal weakness or  numbness.  ____________________________________________   PHYSICAL EXAM:  VITAL SIGNS: ED Triage Vitals  Enc Vitals Group     BP 05/19/18 2012 (!) 160/96     Pulse Rate 05/19/18 2012 84     Resp 05/19/18 2012 18     Temp 05/19/18 2012 98.3 F (36.8 C)     Temp Source 05/19/18 2012 Oral     SpO2 05/19/18 2012 96 %     Weight 05/19/18 2013 68 kg (150 lb)     Height 05/19/18 2013 1.575 m (5\' 2" )     Head Circumference --      Peak Flow --      Pain Score --      Pain Loc --      Pain Edu? --      Excl. in [place]? --     Constitutional: Alert and oriented. Well appearing and in no acute distress. Eyes: Conjunctivae are normal. PERRL. EOMI. Head: right parietal scalp contusion with abrasion Mouth/Throat: Mucous membranes are moist.  Oropharynx non-erythematous. Neck: No stridor.  No meningeal signs.  No cervical spine tenderness to palpation. Cardiovascular: Normal rate, regular rhythm. Good peripheral circulation. Grossly normal heart sounds. Respiratory: Normal respiratory effort.  No retractions. Lungs CTAB. Gastrointestinal: Soft and nontender. No distention.  Musculoskeletal: No lower extremity tenderness nor edema. No gross deformities of extremities. Neurologic:  Normal speech and language. No gross focal neurologic deficits are appreciated.  Skin:  contusion and abrasion to the right parietal scalp. Ecchymoses noted to the left thumb Psychiatric: Mood and affect are normal. Speech and behavior are normal.  ___________________________________ RADIOLOGY I, Valinda N BROWN, personally viewed and evaluated these images (plain radiographs) as part of my medical decision making, as well as reviewing the written report by the radiologist.  ED MD interpretation:  CT head revealed no acute intracranial findings. Left thumb x-ray negative  Official radiology report(s): Ct Head Wo Contrast  Result Date: 05/19/2018 CLINICAL DATA:  Status post fall with right scalp laceration.  EXAM: CT HEAD WITHOUT CONTRAST CT CERVICAL SPINE WITHOUT CONTRAST TECHNIQUE: Multidetector CT imaging of the head and cervical spine was performed following the standard protocol without intravenous contrast. Multiplanar CT image reconstructions of the cervical spine were also generated. COMPARISON:  11/04/2017, head CT FINDINGS: CT HEAD FINDINGS Brain: No evidence of acute infarction, hemorrhage, hydrocephalus, extra-axial collection or mass lesion/mass effect. Advanced brain parenchymal volume loss and deep white matter microangiopathy. Vascular: Calcific atherosclerotic disease of the intracranial vessels. Skull: Normal. Negative for fracture or focal lesion. Sinuses/Orbits: Partial opacification of the left mastoid air cells. Other: Large right parietal scalp hematoma. CT CERVICAL SPINE FINDINGS Alignment: Minimal posterior listhesis of C5 on C4, likely degenerative. Skull base and vertebrae: No acute fracture. No primary bone lesion or focal pathologic process. Soft tissues and spinal canal: No prevertebral fluid or swelling. No visible canal hematoma. Disc levels: Multilevel osteoarthritic changes with disc space narrowing, endplate sclerosis, small osteophyte formation. Moderate posterior facet arthropathy. Upper chest:  Negative. Other: None. IMPRESSION: No acute intracranial abnormality. Advanced brain parenchymal atrophy and chronic microvascular disease. Large right parietal scalp hematoma. No evidence of acute traumatic injury to cervical spine. Multilevel osteoarthritic changes of the cervical spine. Electronically Signed   By: Fidela Salisbury M.D.   On: 05/19/2018 20:49   Ct Cervical Spine Wo Contrast  Result Date: 05/19/2018 CLINICAL DATA:  Status post fall with right scalp laceration. EXAM: CT HEAD WITHOUT CONTRAST CT CERVICAL SPINE WITHOUT CONTRAST TECHNIQUE: Multidetector CT imaging of the head and cervical spine was performed following the standard protocol without intravenous contrast.  Multiplanar CT image reconstructions of the cervical spine were also generated. COMPARISON:  11/04/2017, head CT FINDINGS: CT HEAD FINDINGS Brain: No evidence of acute infarction, hemorrhage, hydrocephalus, extra-axial collection or mass lesion/mass effect. Advanced brain parenchymal volume loss and deep white matter microangiopathy. Vascular: Calcific atherosclerotic disease of the intracranial vessels. Skull: Normal. Negative for fracture or focal lesion. Sinuses/Orbits: Partial opacification of the left mastoid air cells. Other: Large right parietal scalp hematoma. CT CERVICAL SPINE FINDINGS Alignment: Minimal posterior listhesis of C5 on C4, likely degenerative. Skull base and vertebrae: No acute fracture. No primary bone lesion or focal pathologic process. Soft tissues and spinal canal: No prevertebral fluid or swelling. No visible canal hematoma. Disc levels: Multilevel osteoarthritic changes with disc space narrowing, endplate sclerosis, small osteophyte formation. Moderate posterior facet arthropathy. Upper chest: Negative. Other: None. IMPRESSION: No acute intracranial abnormality. Advanced brain parenchymal atrophy and chronic microvascular disease. Large right parietal scalp hematoma. No evidence of acute traumatic injury to cervical spine. Multilevel osteoarthritic changes of the cervical spine. Electronically Signed   By: Fidela Salisbury M.D.   On: 05/19/2018 20:49   Dg Finger Thumb Left  Result Date: 05/19/2018 CLINICAL DATA:  Fall with left thumb pain and bruising EXAM: LEFT THUMB 2+V COMPARISON:  None. FINDINGS: No fracture or malalignment. Moderate degenerative change at the first [facility] joint. Mild arthritis at the MCP and IP joints. No radiopaque foreign body IMPRESSION: Arthritis of the first digit.  No acute osseous abnormality Electronically Signed   By: Donavan Foil M.D.   On: 05/19/2018 20:53      Procedures   ____________________________________________   INITIAL IMPRESSION  / ASSESSMENT AND PLAN / ED COURSE  As part of my medical decision making, I reviewed the following data within the electronic MEDICAL RECORD NUMBER   84 year old female presenting to the emergency department with above stated history of physical exam secondary to accidental fall. Ecchymoses to the left thumb without any fracture noted on x-ray. CT head revealed no acute intracranial findings. ____________________________________________  FINAL CLINICAL IMPRESSION(S) / ED DIAGNOSES  Final diagnoses:  Contusion of scalp, initial encounter     MEDICATIONS GIVEN DURING THIS VISIT:  Medications - No data to display   ED Discharge Orders    None       Note:  This document was prepared using Dragon voice recognition software and may include unintentional dictation errors.    Gregor Hams, MD 05/20/18 [phone]

## 2018-05-20 NOTE — ED Notes (Signed)
Pts head wound was cleaned. No active bleeding.

## 2018-07-13 ENCOUNTER — Ambulatory Visit (INDEPENDENT_AMBULATORY_CARE_PROVIDER_SITE_OTHER): Payer: Medicare Other | Admitting: Adult Health

## 2018-07-13 ENCOUNTER — Encounter: Payer: Self-pay | Admitting: Adult Health

## 2018-07-13 VITALS — BP 141/94 | HR 70 | Ht 63.0 in | Wt 144.0 lb

## 2018-07-13 DIAGNOSIS — M792 Neuralgia and neuritis, unspecified: Secondary | ICD-10-CM

## 2018-07-13 DIAGNOSIS — G3184 Mild cognitive impairment, so stated: Secondary | ICD-10-CM | POA: Diagnosis not present

## 2018-07-13 DIAGNOSIS — Z8673 Personal history of transient ischemic attack (TIA), and cerebral infarction without residual deficits: Secondary | ICD-10-CM

## 2018-07-13 DIAGNOSIS — E782 Mixed hyperlipidemia: Secondary | ICD-10-CM | POA: Diagnosis not present

## 2018-07-13 DIAGNOSIS — I10 Essential (primary) hypertension: Secondary | ICD-10-CM | POA: Diagnosis not present

## 2018-07-13 MED ORDER — GABAPENTIN 100 MG PO CAPS: 100.0000 mg | ORAL_CAPSULE | Freq: Three times a day (TID) | ORAL | 2 refills | Status: DC

## 2018-07-13 NOTE — Patient Instructions (Addendum)
Your Plan:  Start gabapentin 100mg  three times daily - after 2 weeks, if you are tolerating well but still experiencing pain, please let us know and we can increase at that time   We can consider starting a muscle relaxer in the future if needed to help prevent morning pain/stiffness  Continue plavix for secondary stroke prevention    Follow up in 2 months or call earlier if needed      Thank you for coming to see [place] at [facility]. I hope we have been able to provide you high quality care today.  You may receive a patient satisfaction survey over the next few weeks. We would appreciate your feedback and comments so that we may continue to improve ourselves and the health of our patients.

## 2018-07-13 NOTE — Progress Notes (Signed)
[facility] [address] [place]. [place] [address] [phone]       OFFICE FOLLOW UP VISIT NOTE  Tracey. Tracey Hartman Date of Birth:  Sep 28, 1929 Medical Record Number:  [id number]   Chief Complaint  Patient presents with  . Follow-up    Stroke follow room 9 pt with Elta Guadeloupe  pt in wheelchair, pt has bulging disc was given norco by the orthopedic MD     Reason for Referral:  Memory and balance difficulties  HPI: Initial consult 03/01/17 : Tracey Hartman is a pleasant 84 year old Caucasian lady who is accompanied today by her son and granddaughter. She has been having some memory and cognitive difficulties for several months. She attributes this due to significant stress that she is going through. She is in the process of selling her house and is currently building a house in [place] where she plans to move to be close to her family. 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 She had times feels she has trouble remembering recent information as well as at times finding words and completing sentences. Her son feels that her communication is not as crisp as it used to be and she has trouble reading words out and cannot think and sharply. Patient is still independent and manages all activities of daily living. She denies any significant headaches, head injury with loss of consciousness, seizures. She has a prior history of small stroke in July 2015 when she saw me for episode of gait ataxia and some mild right-sided weakness which lasted several days this event and MRI scan of the brain done on 01/14/2014 which I personally reviewed and shows moderate changes of small vessel disease and several tiny microhemorrhages on gradient echo images. Intracranial and extracranial vascular imaging has not been performed and echo has also not been done. Hemoglobin A1c was borderline at 6.3 and lipid profile showed LDL of 94 mg percent She is on aspirin daily  which is tolerating well without side effects. She is also on pravastatin and is tolerating it well without muscle aches or pains. She also takes fish oil daily. She's had some mild balance difficulties for long time but feels awful 8 when she makes a sudden turn or tries to walk quickly she is insecure. She is a had a couple of minor falls but has not had fortunately any major injuries. She denies any tingling numbness burning or pain in her feet. She denies feeling depressed but does admit to some anxiety due to her move. There is no history of episodes of confusion, disorientation. She still driving and has never gotten lost. There is no history of delusions, hallucinations or agitation or abnormal behavior   04/28/2017 visit ; she returns for follow-up after last visit 2 months ago. She is accompanied by her son and granddaughter. Patient states she is doing well she's had no stroke or TIA symptoms. She continues to have dizziness and imbalance. She is careful if she gets gets up slowly but if she makes sudden movements she is more off-balance. She is at no falls or injuries. She did undergo MRI scan of the brain on 03/11/17 which I personally reviewed shows 2 subacute small white matter infarcts on either side which appear to be lacunar in nature. There are also remote age lacunar infarcts. CT angiogram of the brain and neck was performed on 04/20/17 which shows severely calcified bulky plaque extends stenosis involving the right brachiocephalic and left  subclavian arteries. There was a tiny 2 mm posterior right internal carotid artery terminus aneurysm or infundibulum. I personally reviewed the imaging films and discuss with the family. Patient denies any symptoms of vertebrobasilar steal in the form of dizziness and lightheadedness with arms raised about the shoulder. All memory panel labs done on 03/01/17 were normal.  07/21/2017 visit : She returns for follow-up after recent hospital admission on 06/04/2017  with episode of loss of consciousness and fall. She is accompanied by her granddaughter and son who provide history. I have personally reviewed electronic medical records and imaging films. She was emergently admitted for consideration for subclavian steal syndrome given the fact that cerebral catheter angiogram in October 2018 had shown 82% proximal left subclavian stenosis. Patient did complain of confusion and vertical diplopia on admission. She had some amnesia after her fall. MRI scan of the brain done on 06/04/17 showed small right cerebral peduncle and right thalamus punctate lacunar infarct. She underwent cerebral catheter angiogram on 06/06/17 by Dr. Estanislado Pandy but it showed only 65% proximal left subclavian stenosis without clear evidence of subclavian steal. Hence it was decided to treat her medically and subclavian stenting was not performed. Patient was seen by physical occupational therapy and has been getting therapy. She is doing better now she can walk short distances independently. Her blood pressure has been pretty good she is brought with her today her blood pressure log mostly transcend 110 to 130s. Today it is slightly elevated in my office at 157/93. She was changed from Lipitor to Pravachol as she had some side effects. She continues to have mild short-term memory difficulties but these have not progressive. She does not parts. In cognitively challenging activities. At last office visit in September she had scored 29/30 on the Mini-Mental status exam.  11/03/17 visit: Patient is being seen today for follow-up and is accompanied by her son.  Son states she was doing well until 10/09/2017 when she went to bed normal but woke up "not acting the same".  She was unable to control her bladder, she was struggling to get the right words out and had an increase in fatigue.  Denies balance issues, increased weakness on one side, or increasing numbness and tingling.  The son states that her blood pressure  was in the 347Q systolically around that time and was recently started on lisinopril by PCP.  That morning her blood pressure was in the lower 100s and he feels as though her blood pressure was not adequate enough to perfuse as she does have a history of stenosis.  Patient was not seen or had any imaging done since this time.  She is currently taking aspirin 81 mg without increase in bleeding or bruising.  Per Dr. Lenell Antu office note at last visit, he recommended continuing aspirin and Plavix for 3 months and then continue Plavix alone.  Son was not aware of this recommendation.  Continues take pravastatin without increase in myalgias.  Family concern for recent worsening of memory as well.  MMSE performed today and scored a 14/30 (previous MMSE 01/2017 29/30).  Patient was undergoing outpatient PT/SLP but was recommended by outpatient therapist for her to receive home PT/SLP.  I agree with this recommendation at this time but also would recommend adding OT.  Patient would gain great benefit from having therapies in her own home.  She also continues to have problems with her speech where she is unable to get the words out but knows what she wants to say.  Denies any issue with understanding what people are trying to say to her.  Also endorses some signs and symptoms of depression but refuses to have medication treatment for it at this time as she would like to wait and see if this gets better.  Highly recommend at this time to obtain MRI brain to assess for possible stroke.  As patient does have history of 65% carotid stenosis, will also be beneficial to asses obtain MRA of head and neck.  01/03/18 visit: Patient returns today for 47-month follow-up visit and is accompanied by her son.  MRI was ordered after last visit for new onset neurological symptoms.  MRI obtained on 11/10/2017 and did show acute lacunar infarct secondary to small vessel disease.  MRA head/neck negative.  Per son, patient was complaining of severe  joint pain so he stopped the Aricept thinking this was the cause.  After approximately 1 week of being off Aricept, patient continued to complain of joint pain and at that time pravastatin was stopped.  Joint pain was relieved within 24 hours of stopping pravastatin.  Aricept has not been restarted.  Both patient and son believe that her memory has been the same and has not gotten better but has not gotten worse.  Patient has been going to senior center daily for exercise.  Continues to take Plavix without bleeding or bruising.  Blood pressure today satisfactory at 119/78.  Patient states that she recently had blood work done by her PCP.  Unable to see results in epic or care everywhere but per patient and son, PCP stated all of her labs look good and statin was not needed to restart.  Patient would like to hold off on restarting Aricept at this time.  Per son, patient has been acting more like herself where she wants to be active and social.  Denies new or worsening stroke/TIA symptoms.  Interval history 07/13/18: Patient returns today for 63-month follow-up. Her memory has been stable per her and her son. Her largest concern today is her back pain which is painful at all times but worsens with standing with radiating pain down bilateral lower extremities. She is currently sitting in wheelchair as she is only able to ambulate short distances due to pain. Her pain typically improves slightly towards the evening and worse upon first awakening. She has been taking Norco 5-325mg  for pain with some benefit which was prescribed by orthopedics. She will typically only take Norco when she is in severe pain, otherwise she will take tylenol.  She does describe radiating, burning sensation down her bilateral lower extremities.  She did undergo MRI lumbar spine which did show multiple levels of disc narrowing and bulging and moderate left foraminal narrowing at L5-S1.  Per son, she was told by orthopedics that she is not a  candidate for surgical procedure.  She continues on Plavix without side effects of bleeding or bruising.  Blood pressure today 141/94.  No further concerns at this time.  Denies new or worsening stroke/TIA symptoms.    ROS:   14 system review of systems is positive for back pain and all other systems negative  PMH:  Past Medical History:  Diagnosis Date  . Coronary artery disease   . Hypertension   . Myocardial infarction ([place]) 1986  . Osteoporosis   . Recurrent UTI   . Stroke [facility])     Social History:  Social History   Socioeconomic History  . Marital status: Married    Spouse name: Not on  file  . Number of children: 4  . Years of education: 12th  . Highest education level: Not on file  Occupational History  . Occupation: Therapist, music: [employer]  Social Needs  . Financial resource strain: Not on file  . Food insecurity:    Worry: Not on file    Inability: Not on file  . Transportation needs:    Medical: Not on file    Non-medical: Not on file  Tobacco Use  . Smoking status: Former Research scientist (life sciences)  . Smokeless tobacco: Never Used  Substance and Sexual Activity  . Alcohol use: No  . Drug use: No  . Sexual activity: Never  Lifestyle  . Physical activity:    Days per week: Not on file    Minutes per session: Not on file  . Stress: Not on file  Relationships  . Social connections:    Talks on phone: Not on file    Gets together: Not on file    Attends religious service: Not on file    Active member of club or organization: Not on file    Attends meetings of clubs or organizations: Not on file    Relationship status: Not on file  . Intimate partner violence:    Fear of current or ex partner: Not on file    Emotionally abused: Not on file    Physically abused: Not on file    Forced sexual activity: Not on file  Other Topics Concern  . Not on file  Social History Narrative   Patient lives at home alone    Patient is right handed   Patient drinks  coffee daily    Medications:   Current Outpatient Medications on File Prior to Visit  Medication Sig Dispense Refill  . aspirin EC 81 MG tablet Take by mouth.    . Biotin 10 MG TABS Take 1 tablet by mouth daily.    . Calcium Carbonate-Vitamin D 600-400 MG-UNIT tablet Take 2 tablets by mouth daily with breakfast.     . cephALEXin (KEFLEX) 250 MG capsule Take by mouth daily.    . clopidogrel (PLAVIX) 75 MG tablet TAKE 1 TABLET BY MOUTH ONCE DAILY. 30 tablet 11  . Cranberry 1000 MG CAPS Take 1,000 mg by mouth daily.    . Garlic 921 MG CAPS Take by mouth.    . metoprolol tartrate (LOPRESSOR) 50 MG tablet Take 25 mg daily by mouth.    . Multiple Vitamins-Minerals (MULTIVITAMIN WITH MINERALS) tablet Take 1 tablet by mouth daily.    . Omega-3 Fatty Acids (FISH OIL) 1200 MG CAPS Take 2 capsules by mouth daily.      No current facility-administered medications on file prior to visit.     Allergies:   Allergies  Allergen Reactions  . Codeine     UNSPECIFIED REACTION     Physical Exam General: well developed, well nourished, pleasant elderly Caucasian lady seated, in no evident distress Head: head normocephalic and atraumatic.   Neck: supple with no carotid or supraclavicular bruits Cardiovascular: regular rate and rhythm, no murmurs Musculoskeletal: no deformity Skin:  no rash/petichiae Vascular:  Normal pulses all extremities.Symmetric bilateral radial pulses at rest but Adson's test is positive on the left.  Neurologic Exam Mental Status: Awake and fully alert.  Attention span, concentration and fund of knowledge appropriate. Mood and affect appropriate. Mild expressive aphasia Cranial Nerves: Fundoscopic exam deferred. Pupils equal, briskly reactive to light. Extraocular movements full without nystagmus. Visual fields full  to confrontation. Hearing intact. Facial sensation intact. Face, tongue, palate moves normally and symmetrically.  Motor: Normal bulk and tone. Normal strength in  all tested extremity muscles.   Sensory.: intact to touch , pinprick , position and vibratory sensation.  Coordination: Rapid alternating movements normal in all extremities. Finger-to-nose and heel-to-shin performed accurately bilaterally.  Gait and Station: Patient is currently sitting in wheelchair and due to back pain, gait assessment deferred Reflexes: 1+ and symmetric. Toes downgoing.   IMAGING STUDIES:  MR LUMBAR SPINE WO CONTRAST 05/20/2018 Disc levels:  L1-L2: Disc narrowing and desiccation with bulge and superiorly migrating right paracentral extrusion. The herniation narrows the right subarticular recess without compressive stenosis.  L2-L3: Disc narrowing and bulging.  No impingement  L3-L4: Disc narrowing and bulging.  No impingement  L4-L5: Mild disc bulging with posterior annular fissure. Interspinous bursa formation. No impingement  L5-S1:Disc narrowing and bulging with left far-lateral endplate spurring. Moderate left foraminal narrowing from ridging and disc height loss. Patent spinal canal.  IMPRESSION: 1. Fairly mild degenerative changes for age, with mild levoscoliosis. 2. Diffusely patent spinal canal. 3. Moderate left foraminal narrowing at L5-S1     ASSESSMENT: 67 year Caucasian lady with mild memory and cognitive difficulties likely from mild cognitive impairment. Mild gait and balance difficulties likely multifactorial secondary to age-related small vessel disease and prior strokes . Recurrent small right brainstem and thalamic infarcts in November 2018 likely from small vessel disease. Cerebral angiogram shows moderate proximal left subclavian stenosis but clinical exam and history do not support subclavian steal.  MRI on 11/2017 did show near lacunar infarct secondary to small vessel disease.  Patient is being seen today for 62-month follow-up visit and has been stable from a stroke and memory standpoint with her greatest complaint today being back  pain with radiculopathy.  PLAN: -continue plavix 75mg  for secondary stroke prevention -Start gabapentin 100 mg 3 times daily for nerve pain -advised son to call us if she is unable to tolerate or she is tolerating after 1 to 2 weeks but has not gain benefit and we can consider increasing at that time -Can consider initiating muscle relaxer in the future if needed at nighttime due to her awakening with stiffness and increased pain -Consider aqua therapy in the future -Advised patient to increase activity level as tolerated to prevent deconditioning -If conservative measures do not provide patient with relief, possible consideration for injections by orthopedics -Maintain strict control of hypertension with blood pressure goal below 130/90, diabetes with hemoglobin A1c goal below 6.5% and cholesterol with LDL cholesterol (bad cholesterol) goal below 70 mg/dL. I also advised the patient to eat a healthy diet with plenty of whole grains, cereals, fruits and vegetables, exercise regularly and maintain ideal body weight.  Followup in 2 months or call earlier if needed  Greater than 50% time during this 25 minute consultation visit was spent on counseling and coordination of care about HLD and HTN (risk factors), discussion about risk benefit of anticoagulation and answering question  Venancio Poisson, [facility]  [facility] [address] [place], [place] [address]  Phone [phone] Fax [phone]

## 2018-07-13 NOTE — Progress Notes (Signed)
I agree with the above plan 

## 2018-09-13 ENCOUNTER — Ambulatory Visit (INDEPENDENT_AMBULATORY_CARE_PROVIDER_SITE_OTHER): Payer: Medicare Other | Admitting: Adult Health

## 2018-09-13 ENCOUNTER — Encounter: Payer: Self-pay | Admitting: Adult Health

## 2018-09-13 VITALS — BP 141/86 | HR 65 | Ht 63.0 in | Wt 142.0 lb

## 2018-09-13 DIAGNOSIS — G3184 Mild cognitive impairment, so stated: Secondary | ICD-10-CM | POA: Diagnosis not present

## 2018-09-13 DIAGNOSIS — F330 Major depressive disorder, recurrent, mild: Secondary | ICD-10-CM

## 2018-09-13 DIAGNOSIS — R29818 Other symptoms and signs involving the nervous system: Secondary | ICD-10-CM

## 2018-09-13 DIAGNOSIS — M5442 Lumbago with sciatica, left side: Secondary | ICD-10-CM | POA: Diagnosis not present

## 2018-09-13 DIAGNOSIS — G8929 Other chronic pain: Secondary | ICD-10-CM

## 2018-09-13 DIAGNOSIS — M5441 Lumbago with sciatica, right side: Secondary | ICD-10-CM

## 2018-09-13 DIAGNOSIS — M792 Neuralgia and neuritis, unspecified: Secondary | ICD-10-CM | POA: Diagnosis not present

## 2018-09-13 DIAGNOSIS — E0800 Diabetes mellitus due to underlying condition with hyperosmolarity without nonketotic hyperglycemic-hyperosmolar coma (NKHHC): Secondary | ICD-10-CM

## 2018-09-13 DIAGNOSIS — Z8673 Personal history of transient ischemic attack (TIA), and cerebral infarction without residual deficits: Secondary | ICD-10-CM

## 2018-09-13 MED ORDER — DULOXETINE HCL 30 MG PO CPEP: 30.0000 mg | ORAL_CAPSULE | Freq: Every day | ORAL | 3 refills | Status: DC

## 2018-09-13 NOTE — Patient Instructions (Addendum)
Continue clopidogrel 75 mg daily for secondary stroke prevention  Continue to follow up with PCP regarding cholesterol, blood pressure and diabetes management   Order placed for CT head to rule out new stroke due to new left sided weakness  Referral placed for home health physical therapy for ongoing back pain with deconditioning   We will start Cymbalta 30mg  nightly - call after 3-4 weeks for increase of medication - this can help with nerve pain, back pain and possible underlying depression  Continue to monitor blood pressure at home  Maintain strict control of hypertension with blood pressure goal below 130/90, diabetes with hemoglobin A1c goal below 6.5% and cholesterol with LDL cholesterol (bad cholesterol) goal below 70 mg/dL. I also advised the patient to eat a healthy diet with plenty of whole grains, cereals, fruits and vegetables, exercise regularly and maintain ideal body weight.  Followup in the future with me in 3 months or call earlier if needed       Thank you for coming to see [place] at [facility]. I hope we have been able to provide you high quality care today.  You may receive a patient satisfaction survey over the next few weeks. We would appreciate your feedback and comments so that we may continue to improve ourselves and the health of our patients.

## 2018-09-13 NOTE — Progress Notes (Signed)
[facility] [address] [place]. [place] [address] [phone]       OFFICE FOLLOW UP VISIT NOTE  Tracey. Tracey Hartman Date of Birth:  [birth year] Medical Record Number:  [id number]   Chief Complaint  Patient presents with  . Follow-up    Stroke and Memory follow up room 9 pt with son  Tracey Hartman      Reason for Referral:  Memory and balance difficulties  HPI:  Tracey Hartman is a 85 y.o. very pleasant female with PMH of CAD, HTN, MI, and stroke who was initially evaluated in this office on 01/2017 with Dr. Leonie Man due to concerns of memory and balance difficulties.  She returns today accompanied by her son for 44-month follow-up visit.  At prior visit, patient's greatest concern was ongoing back pain with radiculopathy therefore gabapentin 100 mg 3 times daily was started with reported but she was only receiving twice daily.  Initial fatigue during the day but now tolerating twice daily 100 mg capsules well. She does not endorse much benefit from this dose. She will use norco occassionally but will tyr to avoid as she does not like the way it makes her feel. She is ambulatory at home but currently in w/c for transportation. At times depending on pain, her ambulation may be limited.   In regards to her memory, MMSE 14/30 (prior 14/30 11/2017).  Patient and son report memory being stable but granddaughter has concerns regarding waxing and waning of worsening memory.  She is not currently on any medication management for memory as she was previously on Aricept the family discontinued due to possible side effects and was never restarted even despite after discovering Aricept was not the cause of reported side effects (muscle aches/pains likely due to pravastatin).   Blood pressure today satisfactory 141/86.  She continues on Plavix without side effects of bleeding or bruising.  She is currently on omega-3 for HDL management due to statin myalgias.  Son does report recent difficulty with  use of left hand/arm with example of attempting to put on her jacket and had difficulties with getting her left arm through.  He states this has been present for approximately 1 month.  She does continue to have prior post stroke expressive aphasia and son denies any worsening.     HISTORY SUMMARY: Initial consult 03/01/17 PS: Tracey Hartman is a pleasant 85 year old Caucasian lady who is accompanied today by her son and granddaughter. She has been having some memory and cognitive difficulties for several months. She attributes this due to significant stress that she is going through. She is in the process of selling her house and is currently building a house in [place] where she plans to move to be close to her family. 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 She had times feels she has trouble remembering recent information as well as at times finding words and completing sentences. Her son feels that her communication is not as crisp as it used to be and she has trouble reading words out and cannot think and sharply. Patient is still independent and manages all activities of daily living. She denies any significant headaches, head injury with loss of consciousness, seizures. She has a prior history of small stroke in July 2015 when she saw me for episode of gait ataxia and some mild right-sided weakness which lasted several days this event and MRI scan of the brain done on 01/14/2014 which I  personally reviewed and shows moderate changes of small vessel disease and several tiny microhemorrhages on gradient echo images. Intracranial and extracranial vascular imaging has not been performed and echo has also not been done. Hemoglobin A1c was borderline at 6.3 and lipid profile showed LDL of 94 mg percent She is on aspirin daily which is tolerating well without side effects. She is also on pravastatin and is tolerating it well without muscle aches or pains. She also  takes fish oil daily. She's had some mild balance difficulties for long time but feels awful 8 when she makes a sudden turn or tries to walk quickly she is insecure. She is a had a couple of minor falls but has not had fortunately any major injuries. She denies any tingling numbness burning or pain in her feet. She denies feeling depressed but does admit to some anxiety due to her move. There is no history of episodes of confusion, disorientation. She still driving and has never gotten lost. There is no history of delusions, hallucinations or agitation or abnormal behavior   04/28/2017 visit PS:  Continued complaints of dizziness and imbalance MRI head 03/11/2017: Two subacute small white matter infarcts lacunar in nature CTA head/neck 04/20/2017: Severely calcified bulky plaque extends stenosis involving right brachiocephalic and left subclavian arteries along with 2 mm posterior right internal carotid artery terminus aneurysm or infundibulum  07/21/2017 visit PS :  hospital admission on 06/04/2017 with episode of loss of consciousness and fall.  She was emergently admitted for consideration for subclavian steal syndrome given the fact that cerebral catheter angiogram in October 2018 had shown 85% proximal left subclavian stenosis. Patient did complain of confusion and vertical diplopia on admission. She had some amnesia after her fall. MRI scan of the brain done on 06/04/17 showed small right cerebral peduncle and right thalamus punctate lacunar infarct. She underwent cerebral catheter angiogram on 06/06/17 by Dr. Estanislado Pandy but it showed only 65% proximal left subclavian stenosis without clear evidence of subclavian steal. Hence it was decided to treat her medically and subclavian stenting was not performed.  Recovered well.  She continues to have mild short-term memory difficulties but these have not progressive.   11/03/17 visit JV: Episode on 10/09/2017 when she went to bed normal but woke up "not acting the  same".  She was unable to control her bladder, she was struggling to get the right words out and had an increase in fatigue.  No other neurological complaints.  That morning her blood pressure was in the lower 100s and he feels as though her blood pressure was not adequate enough to perfuse as she does have a history of stenosis.  Patient was not seen or had any imaging done since this time.  She is currently taking aspirin 81 mg without increase in bleeding or bruising.  Per Dr. Lenell Antu office note at last visit, he recommended continuing aspirin and Plavix for 3 months and then continue Plavix alone.  Son was not aware of this recommendation.  Continues take pravastatin without increase in myalgias.  Family concern for recent worsening of memory as well.  MMSE performed today and scored a 14/30 (previous MMSE 01/2017 29/30).     She also continues to have problems with her speech where she is unable to get the words out but knows what she wants to say.  Denies any issue with understanding what people are trying to say to her.  Also endorses some signs and symptoms of depression but refuses to have medication  treatment at this time.  Aricept initiated for memory concerns.  01/03/18 visit:  MRI obtained on 11/10/2017 and did show acute lacunar infarct secondary to small vessel disease.  MRA head/neck negative.  Per son, patient was complaining of severe joint pain so he stopped the Aricept thinking this was the cause.  After approximately 1 week of being off Aricept, patient continued to complain of joint pain and at that time pravastatin was stopped.  Joint pain was relieved within 24 hours of stopping pravastatin.  Aricept has not been restarted.  Both patient and son believe that her memory has been stable despite not taking Aricept.  Patient has been going to senior center daily for exercise.  Continues to take Plavix without bleeding or bruising.  Blood pressure today satisfactory at 119/78.  Patient states that  she recently had blood work done by her PCP.  Unable to see results in epic or care everywhere but per patient and son, PCP stated all of her labs look good and statin was not needed to restart.  Patient would like to hold off on restarting Aricept at this time.  Per son, patient has been acting more like herself where she wants to be active and social.  Denies new or worsening stroke/TIA symptoms.  07/13/18 visit: Patient returns today for 31-month follow-up. Her memory has been stable per her and her son. Her largest concern today is her back pain which is painful at all times but worsens with standing with radiating pain down bilateral lower extremities. She is currently sitting in wheelchair as she is only able to ambulate short distances due to pain. Her pain typically improves slightly towards the evening and worse upon first awakening. She has been taking Norco 5-325mg  for pain with some benefit which was prescribed by orthopedics. She will typically only take Norco when she is in severe pain, otherwise she will take tylenol.  She does describe radiating, burning sensation down her bilateral lower extremities.  She did undergo MRI lumbar spine which did show multiple levels of disc narrowing and bulging and moderate left foraminal narrowing at L5-S1.  Per son, she was told by orthopedics that she is not a candidate for surgical procedure.  She continues on Plavix without side effects of bleeding or bruising.  Blood pressure today 141/94.  No further concerns at this time.  Denies new or worsening stroke/TIA symptoms.    ROS:   14 system review of systems is positive for back pain, walking difficulty and memory loss and all other systems negative  PMH:  Past Medical History:  Diagnosis Date  . Coronary artery disease   . Hypertension   . Myocardial infarction ([place]) 1986  . Osteoporosis   . Recurrent UTI   . Stroke [facility])     Social History:  Social History   Socioeconomic History  .  Marital status: Married    Spouse name: Not on file  . Number of children: 4  . Years of education: 12th  . Highest education level: Not on file  Occupational History  . Occupation: Therapist, music: [employer]  Social Needs  . Financial resource strain: Not on file  . Food insecurity:    Worry: Not on file    Inability: Not on file  . Transportation needs:    Medical: Not on file    Non-medical: Not on file  Tobacco Use  . Smoking status: Former Research scientist (life sciences)  . Smokeless tobacco: Never Used  Substance and  Sexual Activity  . Alcohol use: No  . Drug use: No  . Sexual activity: Never  Lifestyle  . Physical activity:    Days per week: Not on file    Minutes per session: Not on file  . Stress: Not on file  Relationships  . Social connections:    Talks on phone: Not on file    Gets together: Not on file    Attends religious service: Not on file    Active member of club or organization: Not on file    Attends meetings of clubs or organizations: Not on file    Relationship status: Not on file  . Intimate partner violence:    Fear of current or ex partner: Not on file    Emotionally abused: Not on file    Physically abused: Not on file    Forced sexual activity: Not on file  Other Topics Concern  . Not on file  Social History Narrative   Patient lives at home alone    Patient is right handed   Patient drinks coffee daily    Medications:   Current Outpatient Medications on File Prior to Visit  Medication Sig Dispense Refill  . cephALEXin (KEFLEX) 250 MG capsule Take by mouth daily.    . clopidogrel (PLAVIX) 75 MG tablet TAKE 1 TABLET BY MOUTH ONCE DAILY. 30 tablet 11  . Cranberry 1000 MG CAPS Take 1,000 mg by mouth daily.    Marland Kitchen gabapentin (NEURONTIN) 100 MG capsule Take 1 capsule (100 mg total) by mouth 3 (three) times daily. 30 capsule 2  . Garlic 884 MG CAPS Take by mouth.    Marland Kitchen HYDROcodone-acetaminophen (NORCO/VICODIN) 5-325 MG tablet   0  . metFORMIN  (GLUCOPHAGE-XR) 500 MG 24 hr tablet   2  . metoprolol tartrate (LOPRESSOR) 50 MG tablet Take 25 mg daily by mouth.    . Multiple Vitamins-Minerals (MULTIVITAMIN WITH MINERALS) tablet Take 1 tablet by mouth daily.    . Omega-3 Fatty Acids (FISH OIL) 1200 MG CAPS Take 2 capsules by mouth daily.      No current facility-administered medications on file prior to visit.     Allergies:   Allergies  Allergen Reactions  . Codeine     UNSPECIFIED REACTION     Physical Exam General: well developed, well nourished, pleasant elderly Caucasian lady seated, in no evident distress Head: head normocephalic and atraumatic.   Neck: supple with no carotid or supraclavicular bruits Cardiovascular: regular rate and rhythm, no murmurs Musculoskeletal: no deformity Skin:  no rash/petichiae Vascular:  Normal pulses all extremities.Symmetric bilateral radial pulses at rest but Adson's test is positive on the left.  Neurologic Exam Mental Status: Awake and fully alert.  Attention span, concentration and fund of knowledge appropriate during visit. Mood and affect appropriate. Mild expressive aphasia from prior infarct MMSE - Mini Mental State Exam 09/13/2018 11/03/2017 03/01/2017  Orientation to time 1 1 5   Orientation to Place 2 3 5   Registration 3 3 3   Attention/ Calculation 0 0 5  Recall 0 0 2  Language- name 2 objects 2 2 2   Language- repeat 1 1 1   Language- follow 3 step command 3 3 3   Language- read & follow direction 0 1 1  Write a sentence 1 0 1  Copy design 1 0 1  Total score 14 14 29    Cranial Nerves: Pupils equal, briskly reactive to light. Extraocular movements full without nystagmus. Visual fields full to confrontation. Hearing intact. Facial sensation intact.  Face, tongue, palate moves normally and symmetrically.  Motor: Normal bulk and tone.  LUE: 4/5 with weak grip strength; LLE: 4/5 greatest in hip flexor and weak ankle dorsiflexion; no weakness noted in left upper or lower  extremity Sensory.: intact to touch , pinprick , position and vibratory sensation.  Coordination: Decreased left hand finger dexterity.  Orbits right arm over left arm. Gait and Station: Patient is currently sitting in wheelchair and due to back pain, gait assessment deferred Reflexes: 1+ and symmetric. Toes downgoing.   IMAGING STUDIES:  No recent imaging obtained     ASSESSMENT: 55 year Caucasian lady with mild memory and cognitive difficulties likely from mild cognitive impairment. Mild gait and balance difficulties likely multifactorial secondary to age-related small vessel disease and prior strokes . Recurrent small right brainstem and thalamic infarcts in November 2018 likely from small vessel disease. Cerebral angiogram shows moderate proximal left subclavian stenosis but clinical exam and history do not support subclavian steal.  MRI on 11/2017 did show near lacunar infarct secondary to small vessel disease.  She is being seen today for follow-up visit with new onset left hemiparesis and ongoing back pain with radiculopathy  PLAN: -continue plavix 75mg  and omega-3 for secondary stroke prevention -Advised to continue gabapentin 100 mg with increased to 3 times daily as initially recommended -Start Cymbalta 30 mg due to ongoing radiculopathy, muscle skeletal pain and possible underlying depression -recommended after 3 to 4 weeks if tolerating well but continues to experience radiculopathy pain, call office in 1 week and consider increasing at that time -Order placed for CT head to rule out new right-sided stroke due to recent onset of left-sided weakness -Referral placed for home health PT to assist with back pain and to prevent deconditioning -Maintain strict control of hypertension with blood pressure goal below 130/90, diabetes with hemoglobin A1c goal below 6.5% and cholesterol with LDL cholesterol (bad cholesterol) goal below 70 mg/dL. I also advised the patient to eat a healthy diet  with plenty of whole grains, cereals, fruits and vegetables, exercise regularly and maintain ideal body weight.  Followup in 3 months or call earlier if needed  Greater than 50% time during this 25 minute consultation visit was spent on counseling and coordination of care about HLD and HTN (risk factors), discussion about risk benefit of anticoagulation and answering question  Venancio Poisson, [facility]  [facility] [address] [place], [place] [address]  Phone [phone] Fax [phone]

## 2018-09-14 ENCOUNTER — Encounter: Payer: Self-pay | Admitting: Adult Health

## 2018-09-14 ENCOUNTER — Telehealth: Payer: Self-pay | Admitting: Adult Health

## 2018-09-14 NOTE — Progress Notes (Signed)
I agree with the above plan 

## 2018-09-14 NOTE — Progress Notes (Signed)
Agree with above note

## 2018-09-14 NOTE — Telephone Encounter (Signed)
Medicare/CSI order sent to GI they will reach out to the pt to schedule .

## 2018-09-20 ENCOUNTER — Telehealth: Payer: Self-pay | Admitting: Adult Health

## 2018-09-20 NOTE — Telephone Encounter (Signed)
Megan/[place] [phone] rec'd referral will have PT tomorrow at the home. FYI

## 2018-09-21 ENCOUNTER — Other Ambulatory Visit: Payer: Self-pay | Admitting: Adult Health

## 2018-09-29 ENCOUNTER — Ambulatory Visit
Admission: RE | Admit: 2018-09-29 | Discharge: 2018-09-29 | Disposition: A | Discharge status: Home / Self Care | Payer: Medicare Other | Source: Ambulatory Visit | Attending: Adult Health | Admitting: Adult Health

## 2018-09-29 DIAGNOSIS — R29818 Other symptoms and signs involving the nervous system: Secondary | ICD-10-CM | POA: Diagnosis not present

## 2018-10-04 ENCOUNTER — Telehealth: Payer: Self-pay

## 2018-10-04 NOTE — Telephone Encounter (Signed)
I called pts granddaughter Myriam Jacobson on dpr. I stated the Ct scan showed age related changes that are expected,its unchanged from last CT scan. Myriam Jacobson verbalized understanding. ------

## 2018-10-04 NOTE — Telephone Encounter (Signed)
-----   Message from Britt Bottom, MD sent at 10/04/2018  9:43 AM EST ----- Please let her know that the CT scan just showed age-related changes that are expected.  It was unchanged compared to her last CT scan.

## 2018-10-07 ENCOUNTER — Telehealth: Payer: Self-pay | Admitting: Neurology

## 2018-10-07 DIAGNOSIS — G3184 Mild cognitive impairment, so stated: Secondary | ICD-10-CM

## 2018-10-07 DIAGNOSIS — Z8673 Personal history of transient ischemic attack (TIA), and cerebral infarction without residual deficits: Secondary | ICD-10-CM

## 2018-10-07 NOTE — Telephone Encounter (Signed)
Orders have been placed.

## 2018-10-07 NOTE — Telephone Encounter (Signed)
Tracey Hartman from [facility] is calling in wanting to extend patients PT for 2 week 3 , 1 week 1   And would like to refer her for a OT eval and  Nursing eval for med management and ADL disfunction ,process teaching.  CB# [phone]

## 2018-10-27 ENCOUNTER — Telehealth: Payer: Self-pay | Admitting: Neurology

## 2018-10-27 NOTE — Telephone Encounter (Signed)
Dinnah from [facility] called stating she went to see the pt today and would like to report a decline in the pt. Pt is needing 2 people to sit and get up. She is not ambulatory right now. When she first started the pt was able to walk a short distance. Please advise.

## 2018-10-31 NOTE — Telephone Encounter (Addendum)
Receive call from [place] therapist with [facility]. She stated pt has decline since last session. She is requiring a 2 person assist now. She does not know if pt has had another stroke. In the beginning pt could walk short distance but now is unable. Message will be sent to [facility] NP for recommendations.

## 2018-10-31 NOTE — Telephone Encounter (Signed)
Left vm for [facility] at [facility] about pts home health therapy. VM was left on Dinnah [phone].

## 2018-12-14 ENCOUNTER — Other Ambulatory Visit: Payer: Self-pay | Admitting: Adult Health

## 2018-12-14 DIAGNOSIS — F330 Major depressive disorder, recurrent, mild: Secondary | ICD-10-CM

## 2018-12-14 DIAGNOSIS — M792 Neuralgia and neuritis, unspecified: Secondary | ICD-10-CM

## 2019-01-10 ENCOUNTER — Ambulatory Visit: Payer: Medicare Other | Admitting: Adult Health

## 2019-02-20 ENCOUNTER — Other Ambulatory Visit: Payer: Self-pay | Admitting: Adult Health

## 2019-02-20 DIAGNOSIS — M792 Neuralgia and neuritis, unspecified: Secondary | ICD-10-CM

## 2019-02-20 DIAGNOSIS — F330 Major depressive disorder, recurrent, mild: Secondary | ICD-10-CM

## 2019-02-20 NOTE — Telephone Encounter (Signed)
I called and spoke to son, Elta Guadeloupe, relating to duloxetine, refill request.  He stated that pt is tolerating the duloxetine 30mg  po daily.  Could not say if helping or not.  Pt gets in recliner, is pretty stationary it sounds like.  I could not get if an increase would help or not.  If you want to try increase or stay at 30mg  po daily.

## 2019-02-20 NOTE — Telephone Encounter (Signed)
Would recommend resuming current dosage at this time

## 2019-02-21 NOTE — Telephone Encounter (Signed)
When I spoke to son, Elta Guadeloupe,  I relayed that prescription would be called in.  Prescription would relate to what Janett Billow thought to try or maintain for pt.  She will maintain at 30mg  po daily.  Elta Guadeloupe, son verbalized understanding that either way prescription would be called in.

## 2019-04-04 ENCOUNTER — Ambulatory Visit: Payer: Self-pay | Admitting: Adult Health

## 2019-04-04 NOTE — Progress Notes (Deleted)
[facility] [address] [place].  [address] [phone]       OFFICE FOLLOW UP VISIT NOTE  Tracey. Tracey Hartman Hartman Date of Birth:  Nov 08, 1929 Medical Record Number:  [id number]   No chief complaint on file.    Reason for Referral:  Memory and balance difficulties  HPI:  Update 04/04/2019: Tracey Hartman Hartman is a 85 year old female who is being seen today for memory follow-up.  Subjectively, memory has been ***.  MMSE today ***(prior 14/30).  Continues on clopidogrel and omega-3 for secondary stroke prevention without reported side effects.  Blood pressure today ***.     Update 09/13/2018: Tracey Hartman Hartman is a 85 y.o. very pleasant female with PMH of CAD, HTN, MI, and stroke who was initially evaluated in this office on 01/2017 with Dr. Leonie Man due to concerns of memory and balance difficulties.  She returns today accompanied by her son for 81-month follow-up visit.  At prior visit, patient's greatest concern was ongoing back pain with radiculopathy therefore gabapentin 100 mg 3 times daily was started with reported but she was only receiving twice daily.  Initial fatigue during the day but now tolerating twice daily 100 mg capsules well. She does not endorse much benefit from this dose. She will use norco occassionally but will tyr to avoid as she does not like the way it makes her feel. She is ambulatory at home but currently in w/c for transportation. At times depending on pain, her ambulation may be limited.  In regards to her memory, MMSE 14/30 (prior 14/30 11/2017).  Patient and son report memory being stable but granddaughter has concerns regarding waxing and waning of worsening memory.  She is not currently on any medication management for memory as she was previously on Aricept the family discontinued due to possible side effects and was never restarted even despite after discovering Aricept was not the cause of reported side effects (muscle aches/pains likely due to pravastatin).     Blood pressure today satisfactory 141/86.  She continues on Plavix without side effects of bleeding or bruising.  She is currently on omega-3 for HDL management due to statin myalgias.  Son does report recent difficulty with use of left hand/arm with example of attempting to put on her jacket and had difficulties with getting her left arm through.  He states this has been present for approximately 1 month.  She does continue to have prior post stroke expressive aphasia and son denies any worsening.     HISTORY SUMMARY: Initial consult 03/01/17 PS: Tracey Hartman Hartman is a pleasant 85 year old Caucasian lady who is accompanied today by her son and granddaughter. She has been having some memory and cognitive difficulties for several months. She attributes this due to significant stress that she is going through. She is in the process of selling her house and is currently building a house in [place] where she plans to move to be close to her family. 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 She had times feels she has trouble remembering recent information as well as at times finding words and completing sentences. Her son feels that her communication is not as crisp as it used to be and she has trouble reading words out and cannot think and sharply. Patient is still independent and manages all activities of daily living. She denies any significant headaches, head injury with loss of consciousness, seizures. She has a prior history of small stroke in July 2015  when she saw me for episode of gait ataxia and some mild right-sided weakness which lasted several days this event and MRI scan of the brain done on 01/14/2014 which I personally reviewed and shows moderate changes of small vessel disease and several tiny microhemorrhages on gradient echo images. Intracranial and extracranial vascular imaging has not been performed and echo has also not been done. Hemoglobin A1c was  borderline at 6.3 and lipid profile showed LDL of 94 mg percent She is on aspirin daily which is tolerating well without side effects. She is also on pravastatin and is tolerating it well without muscle aches or pains. She also takes fish oil daily. She's had some mild balance difficulties for long time but feels awful 8 when she makes a sudden turn or tries to walk quickly she is insecure. She is a had a couple of minor falls but has not had fortunately any major injuries. She denies any tingling numbness burning or pain in her feet. She denies feeling depressed but does admit to some anxiety due to her move. There is no history of episodes of confusion, disorientation. She still driving and has never gotten lost. There is no history of delusions, hallucinations or agitation or abnormal behavior   04/28/2017 visit PS:  Continued complaints of dizziness and imbalance MRI head 03/11/2017: Two subacute small white matter infarcts lacunar in nature CTA head/neck 04/20/2017: Severely calcified bulky plaque extends stenosis involving right brachiocephalic and left subclavian arteries along with 2 mm posterior right internal carotid artery terminus aneurysm or infundibulum  07/21/2017 visit PS :  hospital admission on 06/04/2017 with episode of loss of consciousness and fall.  She was emergently admitted for consideration for subclavian steal syndrome given the fact that cerebral catheter angiogram in October 2018 had shown 85% proximal left subclavian stenosis. Patient did complain of confusion and vertical diplopia on admission. She had some amnesia after her fall. MRI scan of the brain done on 06/04/17 showed small right cerebral peduncle and right thalamus punctate lacunar infarct. She underwent cerebral catheter angiogram on 06/06/17 by Dr. Estanislado Pandy but it showed only 65% proximal left subclavian stenosis without clear evidence of subclavian steal. Hence it was decided to treat her medically and subclavian stenting  was not performed.  Recovered well.  She continues to have mild short-term memory difficulties but these have not progressive.   11/03/17 visit JV: Episode on 10/09/2017 when she went to bed normal but woke up "not acting the same".  She was unable to control her bladder, she was struggling to get the right words out and had an increase in fatigue.  No other neurological complaints.  That morning her blood pressure was in the lower 100s and he feels as though her blood pressure was not adequate enough to perfuse as she does have a history of stenosis.  Patient was not seen or had any imaging done since this time.  She is currently taking aspirin 81 mg without increase in bleeding or bruising.  Per Dr. Lenell Antu office note at last visit, he recommended continuing aspirin and Plavix for 3 months and then continue Plavix alone.  Son was not aware of this recommendation.  Continues take pravastatin without increase in myalgias.  Family concern for recent worsening of memory as well.  MMSE performed today and scored a 14/30 (previous MMSE 01/2017 29/30).     She also continues to have problems with her speech where she is unable to get the words out but knows what she  wants to say.  Denies any issue with understanding what people are trying to say to her.  Also endorses some signs and symptoms of depression but refuses to have medication treatment at this time.  Aricept initiated for memory concerns.  01/03/18 visit:  MRI obtained on 11/10/2017 and did show acute lacunar infarct secondary to small vessel disease.  MRA head/neck negative.  Per son, patient was complaining of severe joint pain so he stopped the Aricept thinking this was the cause.  After approximately 1 week of being off Aricept, patient continued to complain of joint pain and at that time pravastatin was stopped.  Joint pain was relieved within 24 hours of stopping pravastatin.  Aricept has not been restarted.  Both patient and son believe that her memory has  been stable despite not taking Aricept.  Patient has been going to senior center daily for exercise.  Continues to take Plavix without bleeding or bruising.  Blood pressure today satisfactory at 119/78.  Patient states that she recently had blood work done by her PCP.  Unable to see results in epic or care everywhere but per patient and son, PCP stated all of her labs look good and statin was not needed to restart.  Patient would like to hold off on restarting Aricept at this time.  Per son, patient has been acting more like herself where she wants to be active and social.  Denies new or worsening stroke/TIA symptoms.  07/13/18 visit: Patient returns today for 25-month follow-up. Her memory has been stable per her and her son. Her largest concern today is her back pain which is painful at all times but worsens with standing with radiating pain down bilateral lower extremities. She is currently sitting in wheelchair as she is only able to ambulate short distances due to pain. Her pain typically improves slightly towards the evening and worse upon first awakening. She has been taking Norco 5-325mg  for pain with some benefit which was prescribed by orthopedics. She will typically only take Norco when she is in severe pain, otherwise she will take tylenol.  She does describe radiating, burning sensation down her bilateral lower extremities.  She did undergo MRI lumbar spine which did show multiple levels of disc narrowing and bulging and moderate left foraminal narrowing at L5-S1.  Per son, she was told by orthopedics that she is not a candidate for surgical procedure.  She continues on Plavix without side effects of bleeding or bruising.  Blood pressure today 141/94.  No further concerns at this time.  Denies new or worsening stroke/TIA symptoms.    ROS:   14 system review of systems is positive for back pain, walking difficulty and memory loss and all other systems negative  PMH:  Past Medical History:    Diagnosis Date   Coronary artery disease    Hypertension    Myocardial infarction ([place]) 1986   Osteoporosis    Recurrent UTI    Stroke [facility])     Social History:  Social History   Socioeconomic History   Marital status: Married    Spouse name: Not on file   Number of children: 4   Years of education: 12th   Highest education level: Not on file  Occupational History   Occupation: Therapist, music: [employer] resource strain: Not on file   Food insecurity    Worry: Not on file    Inability: Not on file   Transportation needs  Medical: Not on file    Non-medical: Not on file  Tobacco Use   Smoking status: Former Smoker   Smokeless tobacco: Never Used  Substance and Sexual Activity   Alcohol use: No   Drug use: No   Sexual activity: Never  Lifestyle   Physical activity    Days per week: Not on file    Minutes per session: Not on file   Stress: Not on file  Relationships   Social connections    Talks on phone: Not on file    Gets together: Not on file    Attends religious service: Not on file    Active member of club or organization: Not on file    Attends meetings of clubs or organizations: Not on file    Relationship status: Not on file   Intimate partner violence    Fear of current or ex partner: Not on file    Emotionally abused: Not on file    Physically abused: Not on file    Forced sexual activity: Not on file  Other Topics Concern   Not on file  Social History Narrative   Patient lives at home alone    Patient is right handed   Patient drinks coffee daily    Medications:   Current Outpatient Medications on File Prior to Visit  Medication Sig Dispense Refill   cephALEXin (KEFLEX) 250 MG capsule Take by mouth daily.     clopidogrel (PLAVIX) 75 MG tablet TAKE 1 TABLET BY MOUTH ONCE DAILY. 30 tablet 11   Cranberry 1000 MG CAPS Take 1,000 mg by mouth daily.     DULoxetine (CYMBALTA) 30  MG capsule TAKE (1) CAPSULE BY MOUTH ONCE DAILY. 90 capsule 2   gabapentin (NEURONTIN) 100 MG capsule TAKE (1) CAPSULE BY MOUTH THREE TIMES DAILY 90 capsule 6   Garlic [id number] MG CAPS Take by mouth.     HYDROcodone-acetaminophen (NORCO/VICODIN) 5-325 MG tablet   0   metFORMIN (GLUCOPHAGE-XR) 500 MG 24 hr tablet   2   metoprolol tartrate (LOPRESSOR) 50 MG tablet Take 25 mg daily by mouth.     Multiple Vitamins-Minerals (MULTIVITAMIN WITH MINERALS) tablet Take 1 tablet by mouth daily.     Omega-3 Fatty Acids (FISH OIL) 1200 MG CAPS Take 2 capsules by mouth daily.      No current facility-administered medications on file prior to visit.     Allergies:   Allergies  Allergen Reactions   Codeine     UNSPECIFIED REACTION     Physical Exam General: well developed, well nourished, pleasant elderly Caucasian lady seated, in no evident distress Head: head normocephalic and atraumatic.   Neck: supple with no carotid or supraclavicular bruits Cardiovascular: regular rate and rhythm, no murmurs Musculoskeletal: no deformity Skin:  no rash/petichiae Vascular:  Normal pulses all extremities.Symmetric bilateral radial pulses at rest but Adson's test is positive on the left.  Neurologic Exam Mental Status: Awake and fully alert.  Attention span, concentration and fund of knowledge appropriate during visit. Mood and affect appropriate. Mild expressive aphasia from prior infarct MMSE - Mini Mental State Exam 09/13/2018 11/03/2017 03/01/2017  Orientation to time 1 1 5   Orientation to Place 2 3 5   Registration 3 3 3   Attention/ Calculation 0 0 5  Recall 0 0 2  Language- name 2 objects 2 2 2   Language- repeat 1 1 1   Language- follow 3 step command 3 3 3   Language- read & follow direction 0 1 1  Write  a sentence 1 0 1  Copy design 1 0 1  Total score 14 14 29    Cranial Nerves: Pupils equal, briskly reactive to light. Extraocular movements full without nystagmus. Visual fields full to  confrontation. Hearing intact. Facial sensation intact. Face, tongue, palate moves normally and symmetrically.  Motor: Normal bulk and tone.  LUE: 4/5 with weak grip strength; LLE: 4/5 greatest in hip flexor and weak ankle dorsiflexion; no weakness noted in left upper or lower extremity Sensory.: intact to touch , pinprick , position and vibratory sensation.  Coordination: Decreased left hand finger dexterity.  Orbits right arm over left arm. Gait and Station: Patient is currently sitting in wheelchair and due to back pain, gait assessment deferred Reflexes: 1+ and symmetric. Toes downgoing.   IMAGING STUDIES:  No recent imaging obtained     ASSESSMENT: 27 year Caucasian lady with mild memory and cognitive difficulties likely from mild cognitive impairment. Mild gait and balance difficulties likely multifactorial secondary to age-related small vessel disease and prior strokes . Recurrent small right brainstem and thalamic infarcts in November 2018 likely from small vessel disease. Cerebral angiogram shows moderate proximal left subclavian stenosis but clinical exam and history do not support subclavian steal.  MRI on 11/2017 did show near lacunar infarct secondary to small vessel disease.  She is being seen today for follow-up visit with new onset left hemiparesis and ongoing back pain with radiculopathy  PLAN: -continue plavix 75mg  and omega-3 for secondary stroke prevention -Advised to continue gabapentin 100 mg with increased to 3 times daily as initially recommended -Start Cymbalta 30 mg due to ongoing radiculopathy, muscle skeletal pain and possible underlying depression -recommended after 3 to 4 weeks if tolerating well but continues to experience radiculopathy pain, call office in 1 week and consider increasing at that time -Order placed for CT head to rule out new right-sided stroke due to recent onset of left-sided weakness -Referral placed for home health PT to assist with back pain  and to prevent deconditioning -Maintain strict control of hypertension with blood pressure goal below 130/90, diabetes with hemoglobin A1c goal below 6.5% and cholesterol with LDL cholesterol (bad cholesterol) goal below 70 mg/dL. I also advised the patient to eat a healthy diet with plenty of whole grains, cereals, fruits and vegetables, exercise regularly and maintain ideal body weight.  Followup in 3 months or call earlier if needed  Greater than 50% time during this 25 minute consultation visit was spent on counseling and coordination of care about HLD and HTN (risk factors), discussion about risk benefit of anticoagulation and answering question  Frann Rider [facility]), AGNP-BC  [facility] [address] [place], [place] [address]  Phone [phone] Fax [phone]

## 2019-04-06 ENCOUNTER — Encounter: Payer: Self-pay | Admitting: Adult Health

## 2019-04-25 ENCOUNTER — Other Ambulatory Visit: Payer: Self-pay | Admitting: Adult Health

## 2019-04-25 DIAGNOSIS — G450 Vertebro-basilar artery syndrome: Secondary | ICD-10-CM

## 2019-04-25 NOTE — Telephone Encounter (Signed)
Refilled x 1 month with note to pharmacy: needs to schedule FU. Patient was no show for last FU.

## 2019-05-23 ENCOUNTER — Other Ambulatory Visit: Payer: Self-pay | Admitting: Neurology

## 2019-05-23 DIAGNOSIS — G450 Vertebro-basilar artery syndrome: Secondary | ICD-10-CM

## 2019-06-13 ENCOUNTER — Other Ambulatory Visit: Payer: Self-pay | Admitting: Adult Health

## 2019-06-13 DIAGNOSIS — G450 Vertebro-basilar artery syndrome: Secondary | ICD-10-CM

## 2019-07-09 IMAGING — DX DG ELBOW COMPLETE 3+V*L*
4 series · 4 of 4 positions shown · non-contrast
Comparison: None.

CLINICAL DATA: Acute left elbow pain following fall today. Initial
encounter.

EXAM:
LEFT ELBOW - COMPLETE 3+ VIEW

[elbow ap]
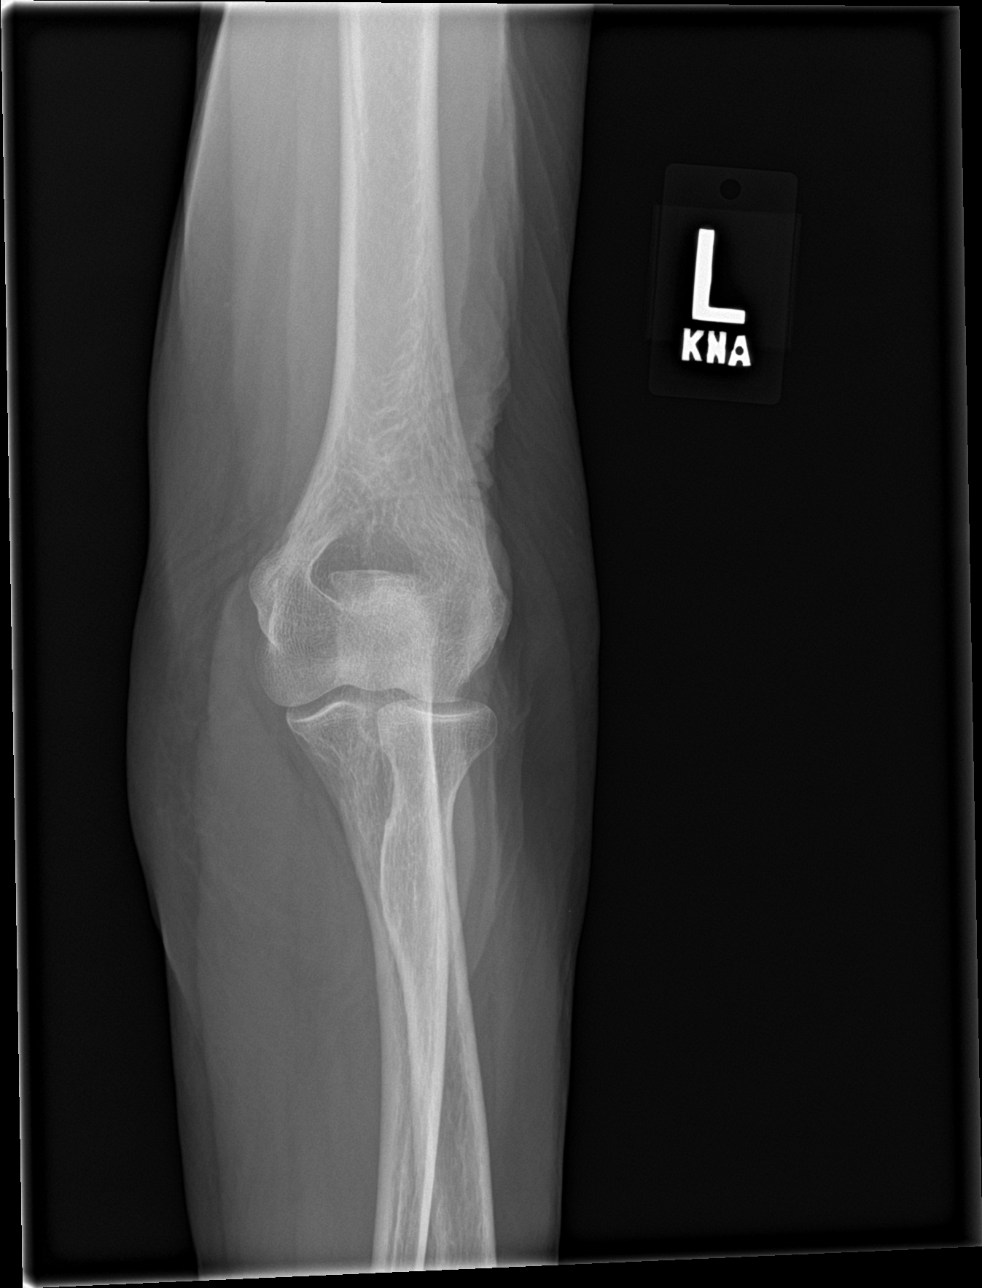

[elbow obl (1 of 2)]
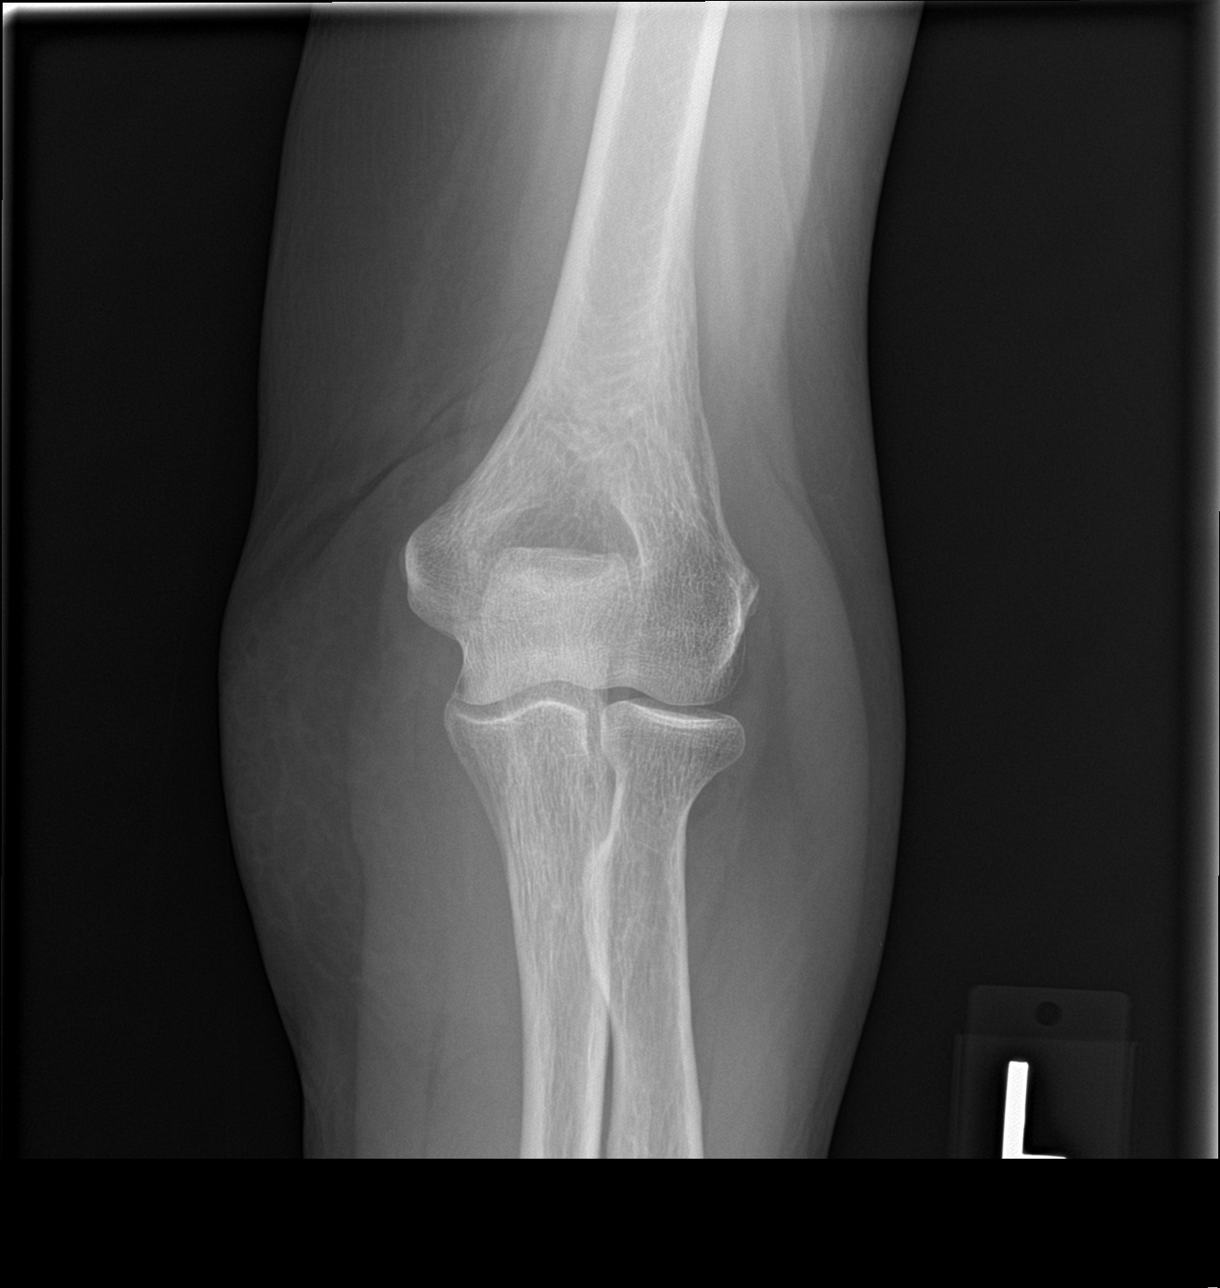

[elbow obl (2 of 2)]
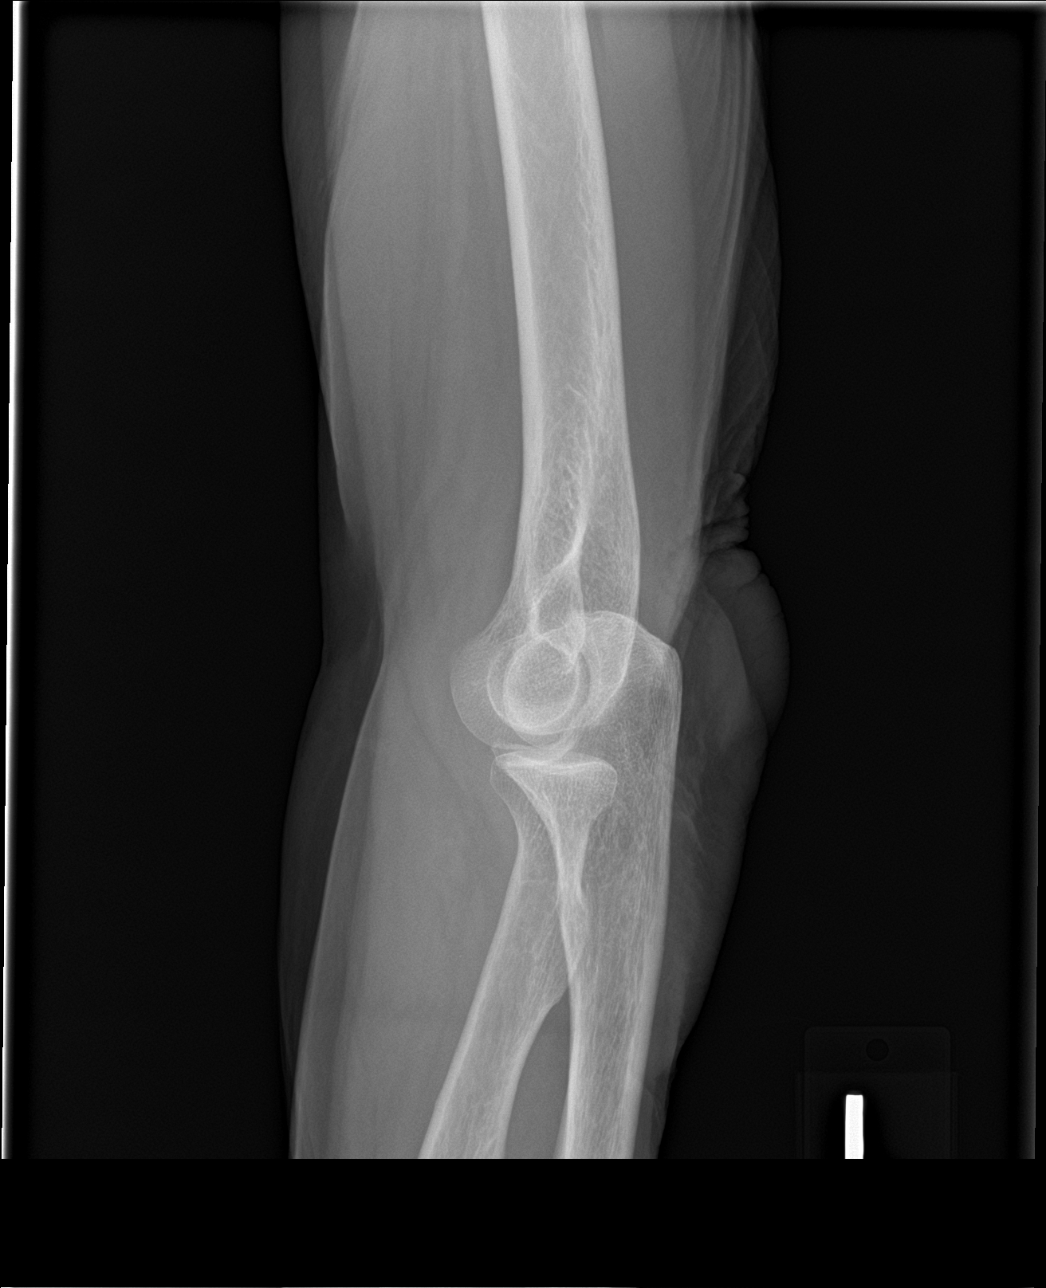

[elbow lat]
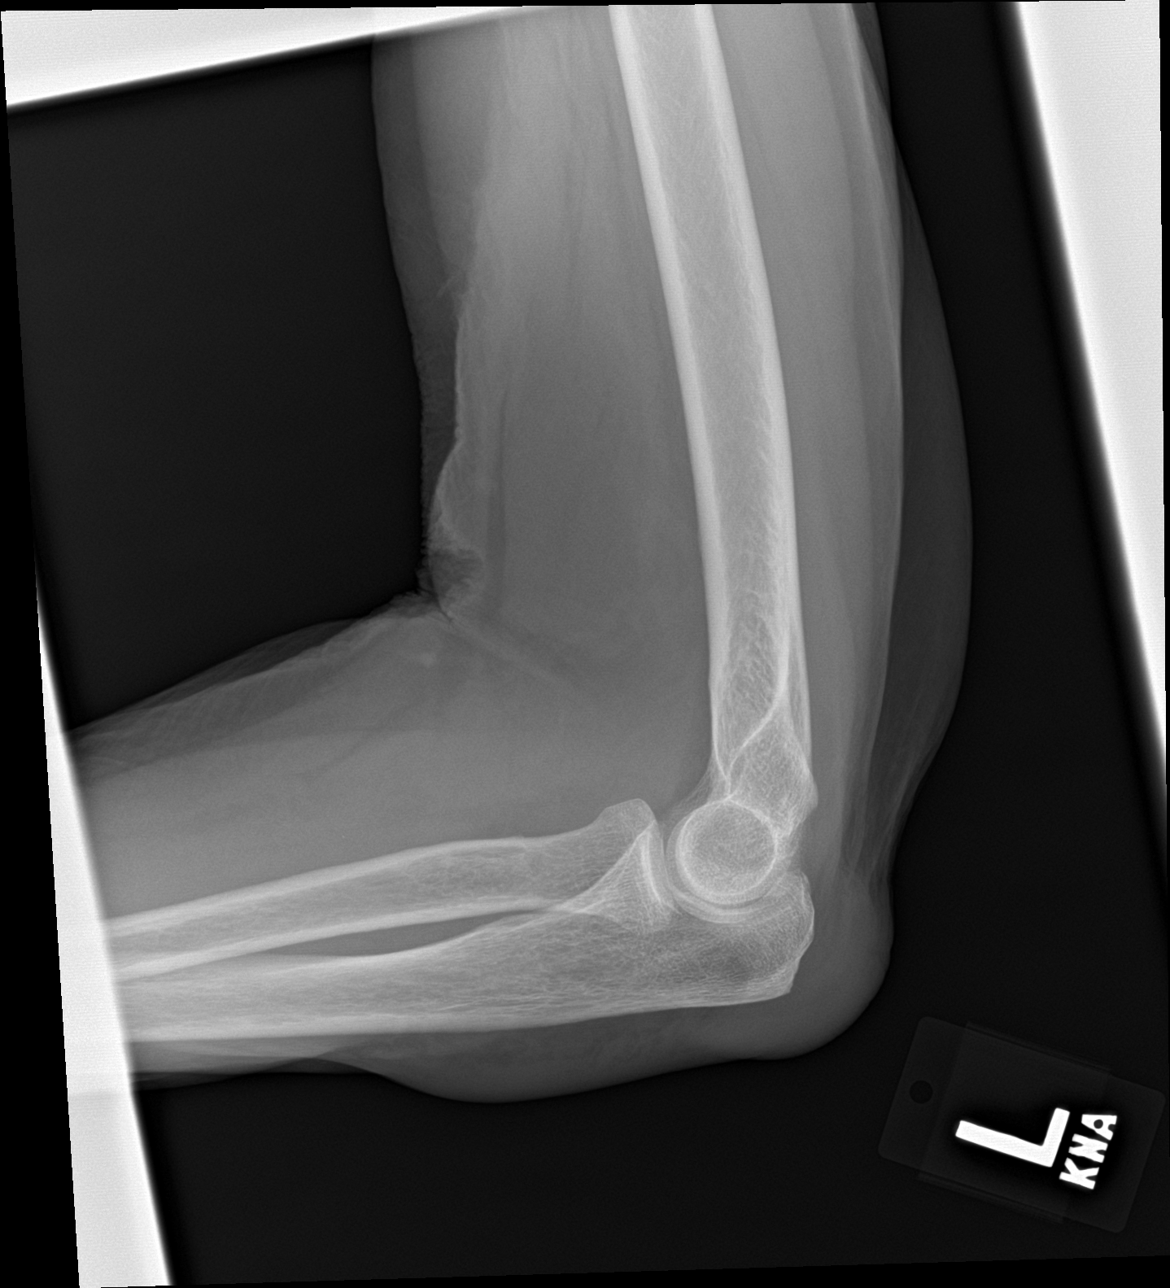

[4 of 4 positions shown; findings below may reference images not displayed]

FINDINGS: There is no evidence of acute fracture, subluxation or dislocation.

There is no evidence of joint effusion.

Posterior soft tissue swelling is noted.
IMPRESSION: Posterior soft tissue swelling without acute bony abnormality.

## 2019-07-21 IMAGING — XA IR VERTEBRAL  NON-SELECT UNILAT LEFT (MS)
1 series · 13 of 24 positions shown · IV contrast (IODINE)
Comparison: MRI of the brain of 06/05/2017 and diagnostic catheter
arteriogram of 05/13/2017.

CLINICAL DATA: Vertebrobasilar insufficiency. Recent history of a
microinfarct in the right cerebral peduncle and periventricular
white matter.

EXAM:
IR ANGIO VERTEBRAL SEL SUBCLAVIAN INNOMINATE UNI LEFT MOD SED
TECHNIQUE: Informed written consent was obtained from the patient after a
thorough discussion of the procedural risks, benefits and
alternatives. All questions were addressed. Maximal Sterile Barrier
Technique was utilized including caps, mask, sterile gowns, sterile
gloves, sterile drape, hand hygiene and skin antiseptic. A timeout
was performed prior to the initiation of the procedure.

[Series 300: dr. (person_name) · 13 of 43 slices shown]
[im 1/43]
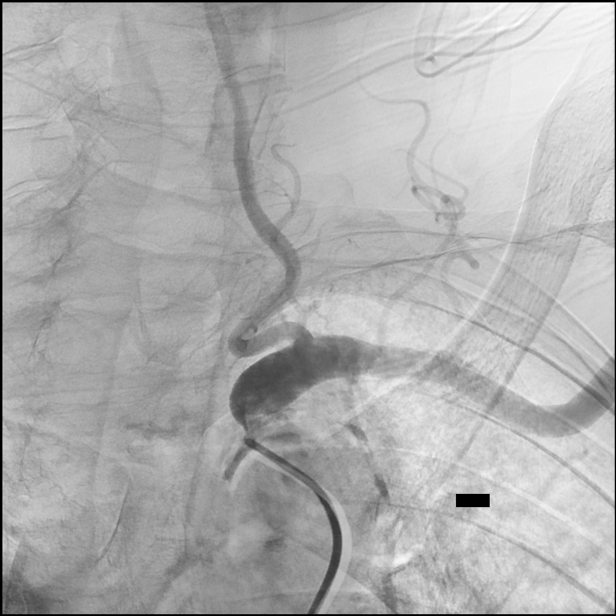
[im 4/43]
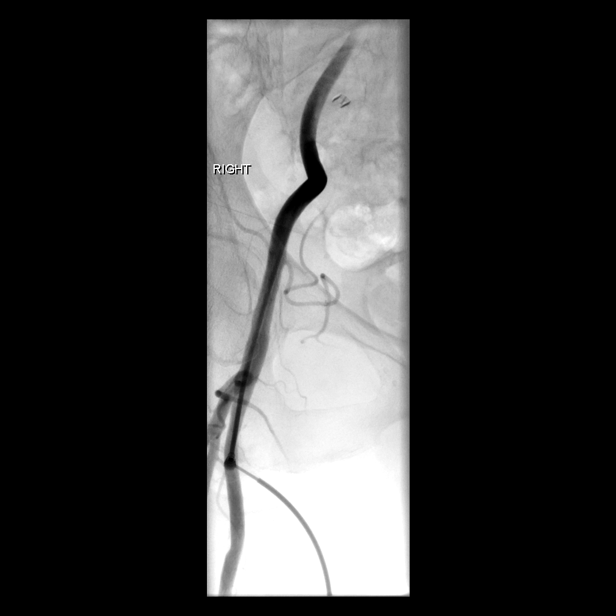
[im 8/43]
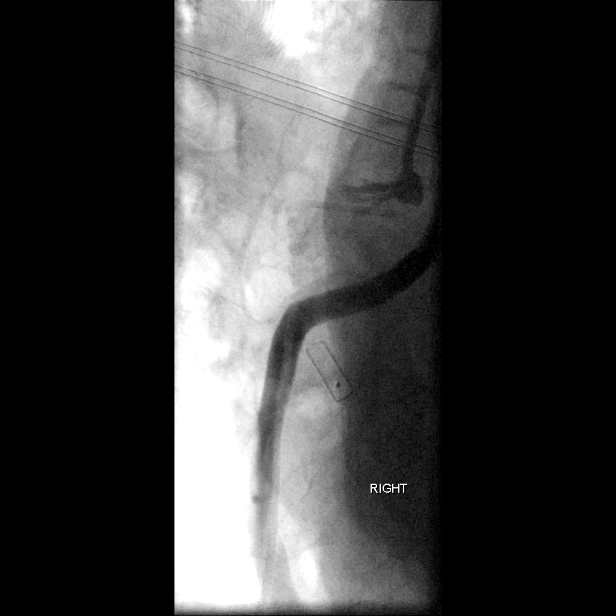
[im 11/43]
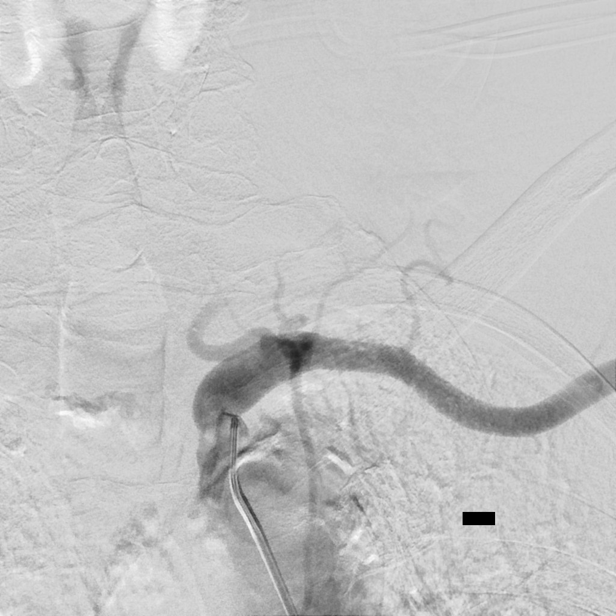
[im 15/43]
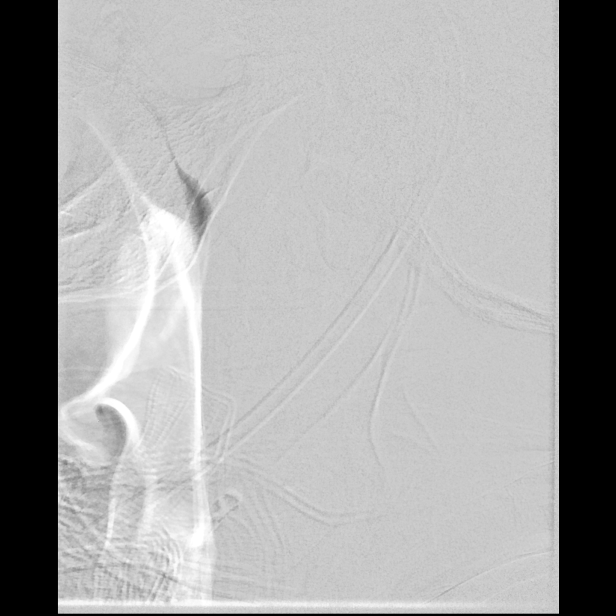
[im 19/43]
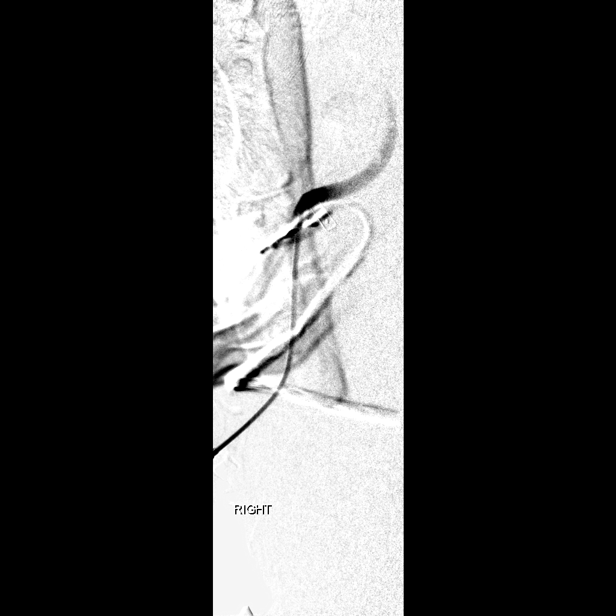
[im 22/43]
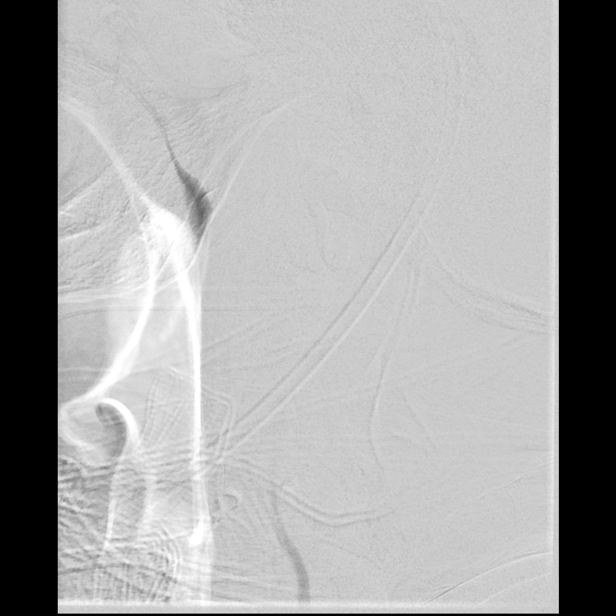
[im 24/43]
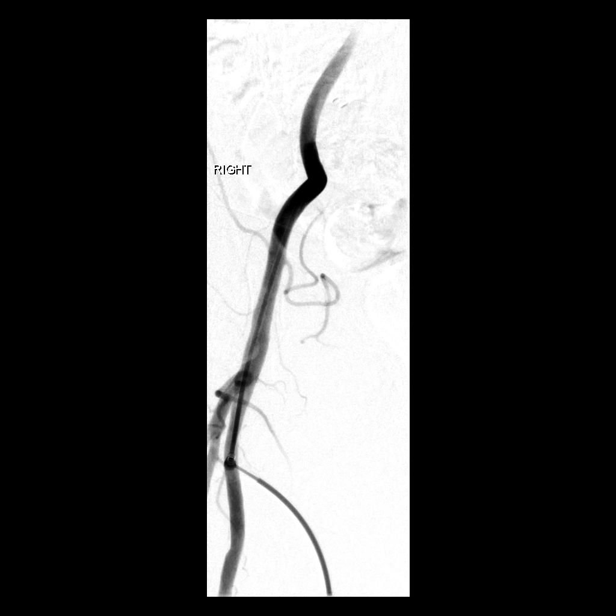
[im 28/43]
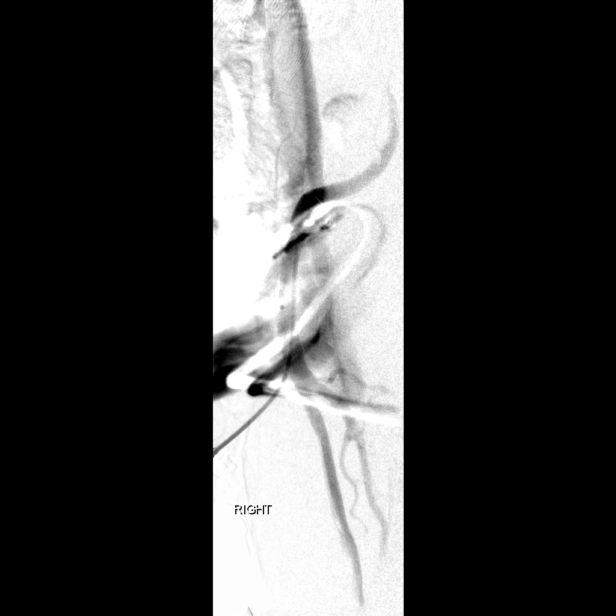
[im 32/43]
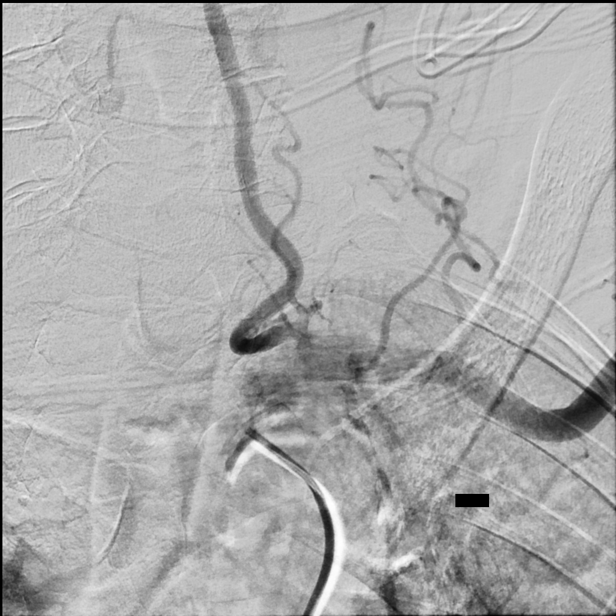
[im 35/43]
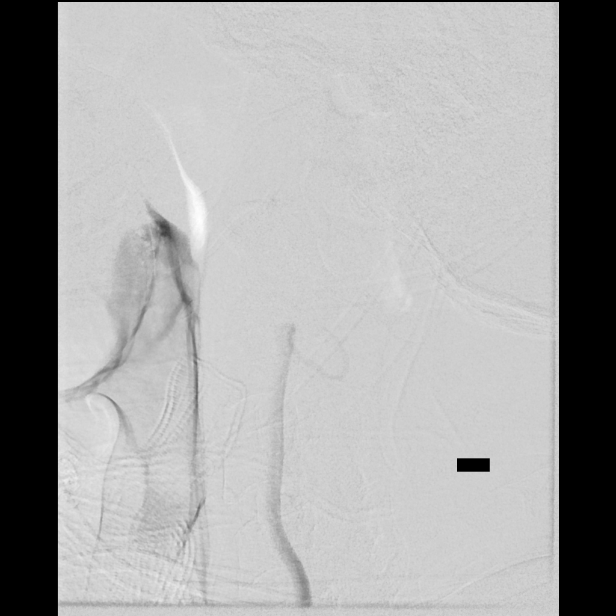
[im 39/43]
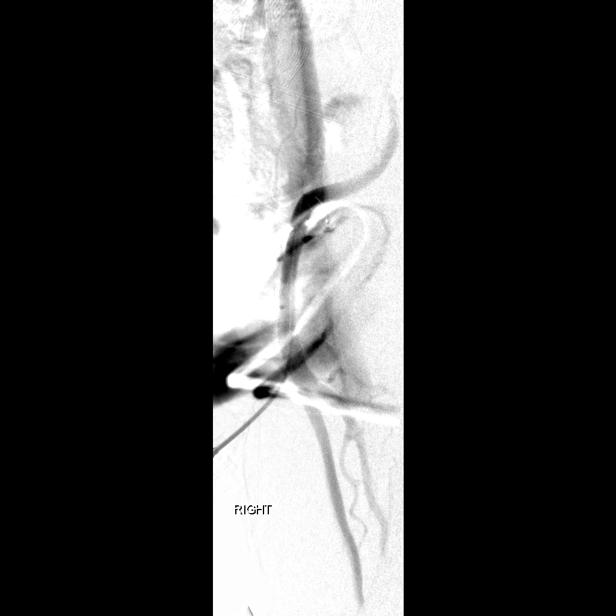
[im 43/43]
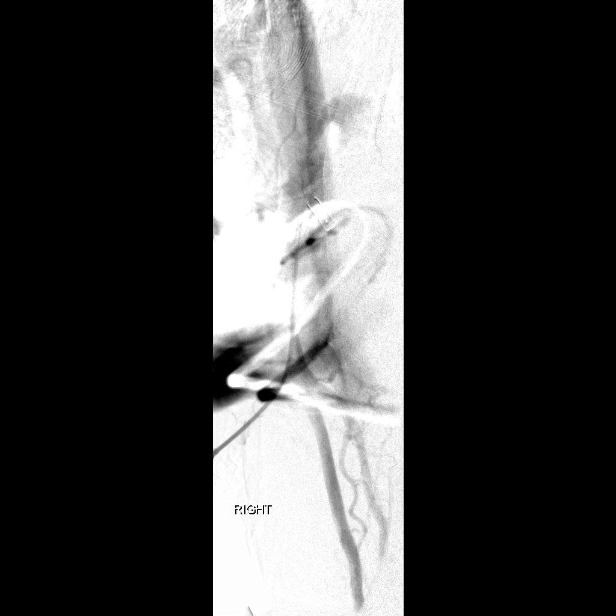

[13 of 24 positions shown; findings below may reference images not displayed]

MEDICATIONS:
Heparin 3,000 units IV. No antibiotic was administered within 1 hour
of the procedure.

ANESTHESIA/SEDATION:
Versed 0.5 mg IV; Fentanyl 50 mcg IV.

Mac anesthesia as per [REDACTED] at [REDACTED].

CONTRAST:  Isovue 300 approximately 30 mL.

FLUOROSCOPY TIME:  Fluoroscopy Time: 2 minutes 18 seconds (321 mGy).

COMPLICATIONS:
None immediate.
The right groin was prepped and draped in the usual sterile fashion.
Thereafter using modified Seldinger technique, transfemoral access
into the right common femoral artery was obtained without
difficulty. Over a 0.035 inch guidewire, a 5 French Pinnacle sheath
was inserted. Through this, and also over 0.035 inch guidewire, a 5
French JB 1 catheter was advanced to the aortic arch region and
selectively positioned in the left subclavian artery.
FINDINGS: The left subclavian arteriogram demonstrates an approximately 65%
stenosis of the proximal left subclavian artery.

Normal opacification is seen of the origin of left vertebral artery
to the cranial skull base.

There is brisk antegrade flow into the distal left subclavian artery
and the left axillary artery.
IMPRESSION: Approximately 65% stenosis of the proximal left subclavian artery
secondary to a partially calcified circumferential atherosclerotic
plaque.

PLAN:
The above angiographic findings were reviewed with the patient and
the patient's family.

Also they were informed that there is no evidence of left subclavian
steal. Additionally, the cuff blood pressures in both arms were as
follows: Left arm 147/73. Right arm 139/71.

In view of the above findings, it was decided to continue with
conservative management at this point, however, should the patient's
symptoms worsen, or if the patient's subsequent follow-up ultrasound
of the left subclavian artery demonstrate worsening stenosis,
endovascular revascularization at that time would be more
appropriate.

In the meantime, the patient has been advised to continue taking her
dual antiplatelets, and to have her blood pressure regularly
monitored by her primary care physician. Follow-up ultrasound of the
left subclavian artery will be undertaken in approximately 6 months
time. Should she develop new symptoms, she has been asked to call
[phone].

The patient and the family leave with good understanding and
agreement with the above management plan.

## 2020-01-02 ENCOUNTER — Telehealth: Payer: Self-pay

## 2020-01-02 NOTE — Telephone Encounter (Signed)
Received notice from pt's granddaughter, Myriam Jacobson, per DPR, that pt passed away recently. Myriam Jacobson has requested that Dr. Leonie Man and Janett Billow, NP be made aware.  Pt has been marked as deceased. No future appts at [facility] are pending.

## 2020-01-02 NOTE — Telephone Encounter (Signed)
Thank you for update. Tracey Hartman, can we please send a card to family? Thank you

## 2020-01-02 NOTE — Telephone Encounter (Signed)
Noted. Card sent

## 2020-01-02 DEATH — deceased

## 2020-01-04 NOTE — Telephone Encounter (Signed)
Thanks for letting me know!
